# Patient Record
Sex: Female | Born: 1950 | Race: Black or African American | Hispanic: No | Marital: Married | Smoking: Never smoker
Health system: Southern US, Community
[De-identification: ages and names within clinical notes are randomized; demographics above are authoritative.]

## PROBLEM LIST (undated history)

## (undated) DIAGNOSIS — E079 Disorder of thyroid, unspecified: Secondary | ICD-10-CM

## (undated) DIAGNOSIS — IMO0002 Reserved for concepts with insufficient information to code with codable children: Secondary | ICD-10-CM

## (undated) DIAGNOSIS — F4001 Agoraphobia with panic disorder: Secondary | ICD-10-CM

## (undated) DIAGNOSIS — F339 Major depressive disorder, recurrent, unspecified: Secondary | ICD-10-CM

## (undated) DIAGNOSIS — E119 Type 2 diabetes mellitus without complications: Principal | ICD-10-CM

## (undated) DIAGNOSIS — I10 Essential (primary) hypertension: Secondary | ICD-10-CM

## (undated) DIAGNOSIS — Z124 Encounter for screening for malignant neoplasm of cervix: Secondary | ICD-10-CM

## (undated) DIAGNOSIS — Z7189 Other specified counseling: Secondary | ICD-10-CM

## (undated) DIAGNOSIS — Z01818 Encounter for other preprocedural examination: Secondary | ICD-10-CM

## (undated) DIAGNOSIS — E1142 Type 2 diabetes mellitus with diabetic polyneuropathy: Secondary | ICD-10-CM

## (undated) DIAGNOSIS — L570 Actinic keratosis: Secondary | ICD-10-CM

## (undated) DIAGNOSIS — Z1231 Encounter for screening mammogram for malignant neoplasm of breast: Secondary | ICD-10-CM

## (undated) DIAGNOSIS — R053 Chronic cough: Secondary | ICD-10-CM

## (undated) DIAGNOSIS — R600 Localized edema: Secondary | ICD-10-CM

## (undated) DIAGNOSIS — K625 Hemorrhage of anus and rectum: Secondary | ICD-10-CM

## (undated) DIAGNOSIS — E785 Hyperlipidemia, unspecified: Secondary | ICD-10-CM

## (undated) DIAGNOSIS — E790 Hyperuricemia without signs of inflammatory arthritis and tophaceous disease: Secondary | ICD-10-CM

## (undated) DIAGNOSIS — M79676 Pain in unspecified toe(s): Secondary | ICD-10-CM

## (undated) DIAGNOSIS — Z903 Acquired absence of stomach [part of]: Secondary | ICD-10-CM

## (undated) DIAGNOSIS — L680 Hirsutism: Secondary | ICD-10-CM

## (undated) DIAGNOSIS — J209 Acute bronchitis, unspecified: Secondary | ICD-10-CM

## (undated) DIAGNOSIS — I8290 Acute embolism and thrombosis of unspecified vein: Secondary | ICD-10-CM

## (undated) DIAGNOSIS — K219 Gastro-esophageal reflux disease without esophagitis: Secondary | ICD-10-CM

## (undated) DIAGNOSIS — G4733 Obstructive sleep apnea (adult) (pediatric): Secondary | ICD-10-CM

## (undated) DIAGNOSIS — M79673 Pain in unspecified foot: Secondary | ICD-10-CM

## (undated) DIAGNOSIS — D485 Neoplasm of uncertain behavior of skin: Secondary | ICD-10-CM

## (undated) DIAGNOSIS — D126 Benign neoplasm of colon, unspecified: Secondary | ICD-10-CM

## (undated) DIAGNOSIS — L858 Other specified epidermal thickening: Secondary | ICD-10-CM

## (undated) DIAGNOSIS — Z1211 Encounter for screening for malignant neoplasm of colon: Secondary | ICD-10-CM

## (undated) DIAGNOSIS — M109 Gout, unspecified: Secondary | ICD-10-CM

## (undated) DIAGNOSIS — K6289 Other specified diseases of anus and rectum: Secondary | ICD-10-CM

## (undated) HISTORY — DX: Disorder of thyroid, unspecified: E07.9

## (undated) HISTORY — PX: TONSILLECTOMY: SUR1361

## (undated) HISTORY — DX: Hyperlipidemia, unspecified: E78.5

---
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
## 1974-11-13 HISTORY — PX: LAPAROSCOPIC OVARIAN CYSTECTOMY: SUR786

## 1985-11-13 HISTORY — PX: VAGINAL HYSTERECTOMY: SUR661

## 1994-02-24 LAB — PAP SMEAR: Pathology Report: NORMAL

## 1994-12-01 LAB — PAP SMEAR
Comment 11: ABNORMAL
Last menstrual period reported: 11196

## 1995-02-01 LAB — PAP SMEAR
Comment 11: ABNORMAL
Last menstrual period reported: 30596

## 1995-11-08 LAB — PAP SMEAR
Last menstrual period reported: 121096
Pathology Report: NORMAL

## 1996-11-27 LAB — PAP SMEAR
Last menstrual period reported: 10798
Pathology Report: NORMAL

## 1997-11-13 HISTORY — PX: SIGMOIDOSCOPY: SUR1295

## 1998-02-08 LAB — PAP SMEAR
Last menstrual period reported: 315
Pathology Report: NORMAL

## 1999-03-16 ENCOUNTER — Encounter: Payer: Self-pay | Admitting: Family Medicine

## 1999-03-16 LAB — CONVERTED CEMR LAB
RBC count: 3.84 10*6/uL
WBC, blood: 5.6 10*3/uL

## 1999-03-22 LAB — PAP SMEAR
Last menstrual period reported: 42200
Pathology Report: NORMAL

## 2000-02-06 LAB — PAP SMEAR
Pathology Report: NORMAL
REPORT DATE: 41101

## 2000-08-13 ENCOUNTER — Emergency Department (HOSPITAL_COMMUNITY): Admission: EM | Admit: 2000-08-13 | Discharge: 2000-08-13 | Payer: Self-pay | Admitting: Emergency Medicine

## 2000-08-13 ENCOUNTER — Encounter: Payer: Self-pay | Admitting: Family Medicine

## 2001-04-14 ENCOUNTER — Emergency Department (HOSPITAL_COMMUNITY): Admission: EM | Admit: 2001-04-14 | Discharge: 2001-04-14 | Payer: Self-pay | Admitting: Emergency Medicine

## 2001-05-30 LAB — PAP SMEAR

## 2001-06-26 ENCOUNTER — Encounter: Payer: Self-pay | Admitting: Family Medicine

## 2001-06-26 LAB — CONVERTED CEMR LAB: RBC count: 3.85 10*6/uL

## 2001-10-17 ENCOUNTER — Encounter

## 2002-02-11 DIAGNOSIS — E785 Hyperlipidemia, unspecified: Secondary | ICD-10-CM

## 2002-02-11 HISTORY — DX: Hyperlipidemia, unspecified: E78.5

## 2002-02-11 MED ORDER — ATENOLOL 100 MG PO TABS
100 MG | ORAL | Status: AC
Start: 2002-02-11 — End: 2003-03-02

## 2002-02-17 ENCOUNTER — Encounter: Payer: Self-pay | Admitting: Family Medicine

## 2002-02-17 LAB — CONVERTED CEMR LAB
Blood Glucose, Fasting: 81 mg/dL
RBC count: 3.85 10*6/uL
WBC, blood: 6.8 10*3/uL

## 2002-03-05 MED ORDER — METFORMIN HCL 500 MG PO TABS
500 MG | ORAL | Status: AC
Start: 2002-03-05 — End: 2003-01-11

## 2002-04-13 DIAGNOSIS — E079 Disorder of thyroid, unspecified: Secondary | ICD-10-CM

## 2002-04-13 HISTORY — DX: Disorder of thyroid, unspecified: E07.9

## 2002-05-08 ENCOUNTER — Encounter: Payer: Self-pay | Admitting: Family Medicine

## 2002-05-08 LAB — CONVERTED CEMR LAB: Pap Smear: NORMAL

## 2002-05-12 ENCOUNTER — Emergency Department (HOSPITAL_COMMUNITY): Admission: EM | Admit: 2002-05-12 | Discharge: 2002-05-12 | Payer: Self-pay

## 2002-05-12 ENCOUNTER — Encounter: Payer: Self-pay | Admitting: Internal Medicine

## 2002-07-15 MED ORDER — BLOOD GLUCOSE TEST VI STRP
Status: AC
Start: 2002-07-15 — End: 2003-07-15

## 2002-10-16 LAB — PAP SMEAR

## 2002-11-28 MED ORDER — ATENOLOL 100 MG PO TABS
100 MG | ORAL | Status: AC
Start: 2002-11-28 — End: 2004-01-14

## 2003-01-06 MED ORDER — METFORMIN HCL 500 MG PO TABS
500 MG | ORAL | Status: AC
Start: 2003-01-06 — End: 2003-09-13

## 2003-06-26 ENCOUNTER — Encounter: Payer: Self-pay | Admitting: Family Medicine

## 2003-06-26 LAB — CONVERTED CEMR LAB: TSH: 2.661 microintl units/mL

## 2003-07-17 MED ORDER — METFORMIN HCL 850 MG PO TABS
850 MG | ORAL | Status: AC
Start: 2003-07-17 — End: 2004-04-30

## 2003-12-30 ENCOUNTER — Encounter: Payer: Self-pay | Admitting: Family Medicine

## 2003-12-30 LAB — CONVERTED CEMR LAB: TSH: 0.384 microintl units/mL

## 2004-04-13 HISTORY — PX: COLONOSCOPY: SHX174

## 2004-08-08 ENCOUNTER — Encounter: Payer: Self-pay | Admitting: Family Medicine

## 2004-08-17 LAB — PAP SMEAR

## 2004-10-17 ENCOUNTER — Encounter

## 2004-10-22 LAB — CARDIOLIPIN ANTIBODY, IGA: Cardiolipin IgA: >14 [APL'U]

## 2005-01-04 ENCOUNTER — Ambulatory Visit: Payer: Self-pay | Admitting: Family Medicine

## 2005-04-09 ENCOUNTER — Emergency Department (HOSPITAL_COMMUNITY): Admission: EM | Admit: 2005-04-09 | Discharge: 2005-04-09 | Payer: Self-pay | Admitting: Emergency Medicine

## 2005-11-25 ENCOUNTER — Emergency Department (HOSPITAL_COMMUNITY): Admission: EM | Admit: 2005-11-25 | Discharge: 2005-11-26 | Payer: Self-pay | Admitting: Emergency Medicine

## 2005-12-20 ENCOUNTER — Ambulatory Visit: Payer: Self-pay | Admitting: Family Medicine

## 2007-03-18 ENCOUNTER — Encounter

## 2007-03-21 LAB — HPV DNA PROBE, AMPLIFIED: HPV 16+18+31+33+35+39+45+51+52+56+58+59+68 DNA,CERVIX,PROBE: NEGATIVE

## 2007-04-04 LAB — PAP SMEAR

## 2007-08-06 ENCOUNTER — Encounter

## 2007-08-19 ENCOUNTER — Ambulatory Visit: Payer: Self-pay | Admitting: Family Medicine

## 2007-08-19 DIAGNOSIS — N959 Unspecified menopausal and perimenopausal disorder: Secondary | ICD-10-CM | POA: Insufficient documentation

## 2007-08-27 ENCOUNTER — Encounter: Payer: Self-pay | Admitting: Family Medicine

## 2007-11-27 ENCOUNTER — Encounter: Payer: Self-pay | Admitting: Family Medicine

## 2007-11-27 DIAGNOSIS — E785 Hyperlipidemia, unspecified: Secondary | ICD-10-CM

## 2007-11-27 DIAGNOSIS — E039 Hypothyroidism, unspecified: Secondary | ICD-10-CM | POA: Insufficient documentation

## 2007-11-27 DIAGNOSIS — Z8679 Personal history of other diseases of the circulatory system: Secondary | ICD-10-CM | POA: Insufficient documentation

## 2007-11-27 DIAGNOSIS — Z87898 Personal history of other specified conditions: Secondary | ICD-10-CM | POA: Insufficient documentation

## 2007-12-06 ENCOUNTER — Ambulatory Visit: Payer: Self-pay | Admitting: Family Medicine

## 2007-12-06 LAB — CONVERTED CEMR LAB
AST: 31 units/L (ref 0–37)
Albumin: 3.8 g/dL (ref 3.5–5.2)
Alkaline Phosphatase: 58 units/L (ref 39–117)
BUN: 10 mg/dL (ref 6–23)
Chloride: 102 meq/L (ref 96–112)
Creatinine, Ser: 0.8 mg/dL (ref 0.4–1.2)
Direct LDL: 132.4 mg/dL
Total Bilirubin: 1.1 mg/dL (ref 0.3–1.2)
VLDL: 16 mg/dL (ref 0–40)

## 2007-12-10 ENCOUNTER — Ambulatory Visit: Payer: Self-pay | Admitting: Family Medicine

## 2008-03-13 ENCOUNTER — Encounter: Payer: Self-pay | Admitting: Family Medicine

## 2008-03-17 ENCOUNTER — Encounter (INDEPENDENT_AMBULATORY_CARE_PROVIDER_SITE_OTHER): Payer: Self-pay | Admitting: *Deleted

## 2008-07-01 ENCOUNTER — Ambulatory Visit: Payer: Self-pay | Admitting: Family Medicine

## 2008-07-01 LAB — CONVERTED CEMR LAB
Bilirubin Urine: NEGATIVE
Nitrite: NEGATIVE
Urobilinogen, UA: 0.2
pH: 6.5

## 2008-07-02 ENCOUNTER — Encounter: Payer: Self-pay | Admitting: Family Medicine

## 2009-02-08 ENCOUNTER — Ambulatory Visit: Payer: Self-pay | Admitting: Family Medicine

## 2009-02-26 ENCOUNTER — Encounter (INDEPENDENT_AMBULATORY_CARE_PROVIDER_SITE_OTHER): Payer: Self-pay | Admitting: *Deleted

## 2009-09-28 ENCOUNTER — Ambulatory Visit: Payer: Self-pay | Admitting: Family Medicine

## 2009-09-28 LAB — CONVERTED CEMR LAB
Glucose, Urine, Semiquant: NEGATIVE
Specific Gravity, Urine: <1.005
Urobilinogen, UA: 0.2

## 2009-09-29 ENCOUNTER — Encounter: Payer: Self-pay | Admitting: Family Medicine

## 2009-11-25 ENCOUNTER — Telehealth: Payer: Self-pay | Admitting: Gastroenterology

## 2010-02-07 ENCOUNTER — Ambulatory Visit: Payer: Self-pay | Admitting: Family Medicine

## 2010-02-14 ENCOUNTER — Encounter

## 2010-02-15 LAB — HPV DNA PROBE, AMPLIFIED: HPV 16+18+31+33+35+39+45+51+52+56+58+59+68 DNA,CERVIX,PROBE: NEGATIVE

## 2010-02-21 LAB — PAP SMEAR

## 2010-04-05 ENCOUNTER — Encounter: Payer: Self-pay | Admitting: Family Medicine

## 2010-04-26 ENCOUNTER — Ambulatory Visit: Payer: Self-pay | Admitting: Family Medicine

## 2010-04-26 LAB — CONVERTED CEMR LAB
Bilirubin Urine: NEGATIVE
Specific Gravity, Urine: 1.02
Urobilinogen, UA: 0.2
pH: 8

## 2010-04-28 ENCOUNTER — Encounter: Payer: Self-pay | Admitting: Family Medicine

## 2010-05-04 ENCOUNTER — Telehealth: Payer: Self-pay | Admitting: Family Medicine

## 2010-06-21 ENCOUNTER — Encounter (INDEPENDENT_AMBULATORY_CARE_PROVIDER_SITE_OTHER): Payer: Self-pay | Admitting: *Deleted

## 2010-08-16 ENCOUNTER — Encounter

## 2010-08-17 ENCOUNTER — Telehealth

## 2010-08-17 NOTE — Telephone Encounter (Addendum)
Member's chart has been reviewed in preparation for his upcoming visit with Dr. Pricilla Holm on 08/31/10. The following Care Gaps were identified: POE LAB ORDERS :HEMOGLOBIN A1C

## 2010-08-17 NOTE — Telephone Encounter (Addendum)
Mbr had diabetic panel done.

## 2010-08-22 ENCOUNTER — Telehealth

## 2010-08-22 MED ORDER — CHOLECALCIFEROL 50 MCG (2000 UT) PO TABS
50 MCG (2000 UT) | ORAL | Status: AC
Start: 2010-08-22 — End: 2014-09-16

## 2010-08-22 NOTE — Telephone Encounter (Addendum)
Agreed will begin Vitamin D3 2000-5000 units po qd; Educated about medications including options, risks, and benefits. Call prn and follow up as scheduled.

## 2010-08-24 NOTE — Progress Notes (Addendum)
Antidepressant follow up: No call. Client spoke with L. DeBalzo CNS 08/22/10.

## 2010-08-31 NOTE — Progress Notes (Addendum)
SUBJECTIVE:  This 59 year old  female presents for follow up of diabetes.    Other significant comorbidities include hyperlipidemia, hypertension, obesity and depression.    Patient's cardiovascular risk history includes: LDL goal is under 100  hypertension  hyperlipidemia  obese.    Her compliance with diet and other recommendations since last visit has been good.  Much improved     Self monitoring of Glucose:  Pre-breakfast 169-189, at times 120 if eating vegetable salad        Pre-lunch denies snacks             Pre-dinner at times 120       Bedtime takes Metformin 1000 mg at bedtime and in morning, Glyburide 5 mg in AM/15 mg in PM  Overnight     Severe hypoglycemic episodes since last visit: no    Mild hypoglycemic episode: none per week.    Current outpatient prescriptions   Medication Sig Dispense Refill   ??? VITAMIN D-3 2000 UNIT ORAL TAB Take 1-1-1/2 tabs po qd  45  3   ??? SERTRALINE 100 MG ORAL TAB TAKE 1 TABLET BY MOUTH ONETIME DAILY  60  1   ??? ATENOLOL 100 MG ORAL TAB TAKE 1 TABLET BY MOUTH ONE TIME DAILY  100  3   ??? METFORMIN 1000 MG ORAL TAB TAKE 1 TABLET BY MOUTH TWICE DAILY FOR DIABETES  120  3   ??? SERTRALINE  50 MG ORAL TAB TAKE 1 TABLET BY MOUTH ONE TIME DAILY FOR 1 WEEK THEN TAKE 2 TABLETS BY MOUTH ONE TIME DAILY  60  3   ??? TRAZODONE  50 MG ORAL TAB TAKE 1/2 TO 2 TABLETS BY MOUTH EVERY NIGHT AT BEDTIME AS NEEDED FOR SLEEP  60  3   ??? SIMVASTATIN 40 MG ORAL TAB TAKE 1 TABLET BY MOUTH EVERY NIGHT AT BEDTIME  60  5   ??? PROAIR HFA 90 MCG/ACTUATION INHL HFAA USE 2 PUFFS BY MOUTH EVERY 4 TO 6 HOURS AS NEEDED FOR WHEEZING AND COUGH  8.5  2   ??? ONE TOUCH ULTRA TEST MISC STRIPS USE AS DIRECTED BY PHYSICIAN FOR DIABETES  200  12   ??? GLYBURIDE 5 MG ORAL TAB TAKE 1 TABLET BY MOUTH EVERY MORNING AND TAKE 3 TABLETS EVERY EVENING FOR DIABETES  360  3   ??? ACTOS 15 MG ORAL TAB TAKE 2 TABLETS BY MOUTH ONE TIME DAILY FOR DIABETES  120  3   ??? LISINOPRIL 10 MG ORAL TAB TAKE 1 TABLET BY MOUTH ONE TIME DAILY FOR BLOOD  PRESSURE  90  3   ??? ALLOPURINOL 100 MG ORAL TAB TAKE 2 TABLETS BYMOUTH ONE TIME DAILY TO PREVENT GOUT  200  3     History   Smoking status   ??? Former Smoker   ??? Quit date: 11/13/1993   Smokeless tobacco   ??? Never Used           HGBA1C   Date Value Range Status   08/16/2010 7.1* 4.2-5.8 (%) Final       CHOLESTEROL   Date Value Range Status   08/16/2010 119  <200- (MG/DL) Final   9/60/4540 981  <200- (MG/DL) Final   19/11/4780 956  <200- (MG/DL) Final      TRIGLYCERIDE   Date Value Range Status   08/16/2010 184* <150- (MG/DL) Final   12/27/863 784* <150- (MG/DL) Final   69/04/2951 841* <150- (MG/DL) Final      HDL  Date Value Range Status   08/16/2010 31* >40- (MG/DL) Final    (NOTE)    >=16 mg/dl is Desirable   11/22/6043 38* >40- (MG/DL) Final    (NOTE)    >=40 mg/dl is Desirable   98/11/1912 44  >40- (MG/DL) Final    (NOTE)    >=78 mg/dl is Desirable      LDL DIRECT   Date Value Range Status   08/16/2010 56  30-130 (MG/DL) Final    (NOTE)    LESS THAN 100 MG/DL      OPTIMAL    295 TO 129 MG/DL         NEAR OPTIMAL/ABOVE OPTIMAL    130 TO 159 MG/DL         BORDERLINE HIGH    160 TO 189 MG/DL    HIGH    621 MG/DL AND ABOVE      VERY HIGH   01/31/2010 82  30-130 (MG/DL) Final    (NOTE)    LESS THAN 100 MG/DL      OPTIMAL    308 TO 129 MG/DL         NEAR OPTIMAL/ABOVE OPTIMAL    130 TO 159 MG/DL         BORDERLINE HIGH    160 TO 189 MG/DL         HIGH    657 MG/DL AND ABOVE      VERY HIGH   09/20/2009 89  30-130 (MG/DL) Final    (NOTE)    LESS THAN 100 MG/DLOPTIMAL    100 TO 129 MG/DL         NEAR OPTIMAL/ABOVE OPTIMAL    130 TO 159 MG/DL         BORDERLINE HIGH    160 TO 189 MG/DL         HIGH    846 MG/DL AND ABOVE      VERY HIGH      LDL CALCULATED   Date Value Range Status   12/21/2004 93  50-140 (MG/DL) Final   9/62/9528 84  50-140 (MG/DL) Final       MICROALBUMIN, URINE   Date Value Range Status   08/16/2010 1.0  0.0-1.7 (MG/DL) Final      CREATININE, URINE   Date Value Range Status   08/16/2010 181.2  - (MG/DL) Final       MICROALBUMIN/CREATININE RATIO, URINE   Date Value Range Status   08/16/2010 5.5  <30- (MG/G) Final       POTASSIUM   Date Value Range Status   08/16/2010 4.3  3.5-5.0 (mmol/L) Final       ALT   Date Value Range Status   08/16/2010 18  9-52 (U/L) Final       CREATININE   Date Value Range Status   08/16/2010 0.60  0.52-1.04 (MG/DL) Final         BP Readings from Last 3 Encounters:   08/31/2010 120/72   08/03/2010 122/67   02/25/2010 126/64           Estimated Body mass index is 42.76 kg/(m^2) as calculated from the following:    Height as of this encounter: 5\' 7" (1.702 m).    Weight as of this encounter: 273 lb(123.832 kg).  "I like potatoes",   No exercising    OBJECTIVE: 20 lb weight reduction past 6 months.   FOOT EXAM: sensate to 10 gms, skin intact, pulses present and pulses present      ASSESSMENT/PLAN:  DM 2  (primary encounter diagnosis)  Note: uncontrolled but secondary to dietary preferences  Plan: pt. Will involve in exercise program. Continue current therapy.   PROPHYLACTIC VACCINE FOR INFLUENZA  Note: immunization  Plan: VACC INFLUENZA SPLIT VIRUS, 18 YRS AND OLDER,         VACC ADMIN, FIRST IM OR SUBQ VACCINE TOXOID              FOLLOW-UP: 2 mo with NP

## 2010-09-19 ENCOUNTER — Ambulatory Visit: Payer: Self-pay | Admitting: Family Medicine

## 2010-09-19 DIAGNOSIS — N390 Urinary tract infection, site not specified: Secondary | ICD-10-CM

## 2010-09-19 LAB — CONVERTED CEMR LAB
Ketones, urine, test strip: NEGATIVE
Nitrite: NEGATIVE
Specific Gravity, Urine: <1.005
Urobilinogen, UA: 0.2

## 2010-09-19 NOTE — Progress Notes (Addendum)
Antidepressant follow up: I have attempted to contact this patient by phone with the following results: left message to return my call on answering machine. Compliant with medication per pharmacy screen.       Medication: Sertraline 100mg  one daily    Last refiled:09/02/10    Dispensed: #60    Refills remaining:1

## 2010-09-20 ENCOUNTER — Encounter: Payer: Self-pay | Admitting: Family Medicine

## 2010-09-29 NOTE — Telephone Encounter (Addendum)
Consult  Giavana Bains 59 year old female  Chief Complaint:  Diabetic with left toe red, bruising to lateral aspect and 5th digit and plantar surface of foot, painful and has neuropathy    Duration: onset 11/16, can't recall injury    With Provider: Dr. Antonietta Barcelona seen in past    Preferred Days:  Friday    Preferred Time:  no preference    [facility]: [facility]    Preferred Appointment: Based on template review appointment is being forwarded to the department for scheduling assistance    Contact Phone Number:   Telephone Information:   Home Phone [phone]     Deborra Cossey a 59 year old with chiefcomplaint of onset yesterday of left 4th toe redness yesterday and has bruising on side of foot and including last toe bruised.  Diabetic, painful to foot in shoe and shoe feels tight.  Has neuropathy.  States did cut nails on foot today.  Advised to sda.      Triaged and Assessed: Foot Problems-P Protocol    N Skin breakage    N Drainage/discharge    N Fever, night sweats, chills    N Severe pain    ? Redand  warm    Y Discoloration     Schedule SDA (appt within 2-24h) in medicine or Podiatry or follow UC process.       Triage Plan Demographics verified, Instructed to call back if symptoms persist/change/worsen and Verbalized understanding/willingness to follow instructions.

## 2010-09-30 ENCOUNTER — Encounter

## 2010-09-30 NOTE — Progress Notes (Addendum)
SUBJECTIVE:  Eileen Barnes is a 59 year old female. is identified by name. Presents with redness and bruising of the 4th and 5th toe left x 2 days.  BS elevated  Onset: Insidious    Nature: brusing / rednesss  Injury: None    Aggravating Factors: walking  Prior Treatment: none    ALLERGY:  Allergies   Allergen Reactions   ??? Tramadol Hcl Nausea and/or Vomiting   ??? Codeine And Opiate Derivatives Nausea and/or Vomiting   ??? Kiwi Fruit      Pt. States throat closes up    ??? Vicodin (Acetaminophen-hydrocodone)      Vomiting     ??? Pamelor (Nortriptyline)      Severe constipation   ??? Talwin (Pentazocine Lactate) Nausea and/or Vomiting         Current outpatient prescriptions:ASPIRIN   81 MG ORAL TBEC DR TAB, 1 TAB PO DAILY, Disp: 100, Rfl: 0;  VITAMIN D-3 2000 UNIT ORAL TAB, Take 1-1-1/2 tabs po qd, Disp: 45, Rfl: 3;  SERTRALINE 100 MG ORAL TAB, TAKE 1 TABLET BY MOUTH ONE TIME DAILY, Disp: 60, Rfl: 1;  ATENOLOL 100 MG ORAL TAB, TAKE 1 TABLET BY MOUTH ONE TIME DAILY, Disp: 100, Rfl: 3;  METFORMIN 1000 MG ORAL TAB, TAKE 1 TABLET BY MOUTH TWICE DAILY FOR DIABETES, Disp: 120, Rfl: 3  SERTRALINE  50 MG ORAL TAB, TAKE 1 TABLET BY MOUTH ONE TIME DAILY FOR 1 WEEK THEN TAKE 2 TABLETS BY MOUTH ONE TIME DAILY, Disp: 60, Rfl: 3;  TRAZODONE  50 MG ORAL TAB, TAKE 1/2 TO 2 TABLETS BY MOUTH EVERY NIGHT AT BEDTIME AS NEEDED FOR SLEEP, Disp: 60, Rfl: 3;  SIMVASTATIN 40 MG ORAL TAB, TAKE 1 TABLET BY MOUTH EVERY NIGHT AT BEDTIME, Disp: 60, Rfl: 5  PROAIR HFA 90 MCG/ACTUATION INHL HFAA, USE 2 PUFFS BY MOUTH EVERY 4 TO 6 HOURS AS NEEDED FOR WHEEZING AND COUGH, Disp: 8.5, Rfl: 2;  ONE TOUCH ULTRA TEST MISC STRIPS, USE AS DIRECTED BY PHYSICIAN FOR DIABETES, Disp: 200, Rfl: 12;  GLYBURIDE 5 MG ORAL TAB, TAKE 1 TABLET BY MOUTH EVERY MORNING AND TAKE 3 TABLETS EVERY EVENING FOR DIABETES, Disp: 360, Rfl: 3  LISINOPRIL 10 MG ORAL TAB, TAKE 1 TABLET BY MOUTH ONE TIME DAILY FOR BLOOD PRESSURE, Disp: 90, Rfl: 3;  ALLOPURINOL 100 MG ORAL TAB, TAKE 2  TABLETS BY MOUTH ONE TIME DAILY TO PREVENT GOUT, Disp: 200, Rfl: 3;  ACTOS 15 MG ORAL TAB, TAKE 2 TABLETS BY MOUTH ONE TIME DAILY FOR DIABETES, Disp: 120, Rfl: 3    Past Medical History   Diagnosis Date   ??? SEVERE OBESITY (BMI >= 40) 07/09/2004     MORBID OBESITY   ??? MENOPAUSE 08/16/2004     FEMALE CLIMACTERIC STATE   ??? ACTINIC KERATOSIS.Marland Kitchen 02/12/2003     ACTINIC KERATOSIS   ??? DM 2, UNCONTROLLED, W DIABETIC POLYNEUROPATHY    ??? ESSENTIAL HTN    ??? HYPERLIPIDEMIA    ??? GOUT          Past Surgical History   Procedure Date   ??? Lasik          History   Social History   ??? Marital Status: Unknown     Spouse Name: Married     Number of Children: N/A   ??? Years of Education: N/A   Occupational History   ??? Retired Public house manager     Social History Main Topics   ??? Smoking status: Former Smoker  Quit date: 11/13/1993   ??? Smokeless tobacco: Never Used   ??? Alcohol Use: No   ??? Drug Use: No   ??? Sexually Active: Not Currently -- Female partner(s)      ascus (=) HPV with cryo   Other Topics Concern   ??? Not onfile   Social History Narrative   ??? No narrative on file       Family History   Problem Relation Age of Onset   ??? Other [Other] [OTHER] Mother      10 Years of Alzheimer's Disease and Living  with HTN, Thyroid problems   ??? Diabetes Father      Deceased With Complications, Dialysis patient   ??? Hypothyroidism Paternal Aunt      Hx of Grave's Disease   ??? Other [Other] [OTHER] Paternal Aunt      Neuropathy unrelated to Diabetes   ??? Other [Other] [OTHER] Brother      Overweight with HX of DVT   ??? Breast Cancer Maternal Aunt    ??? Colon Cancer Maternal Aunt      x4   ??? Ovarian Cancer None    ??? Uterine Cancer None    ??? Diabetes Maternal Grandmother          ROS: DM / neuropathy / toe pain  Pt. Denies fever, chills, nausea, vomiting, diarrhea, calf pain. chest pain, shortness of breath.    OBJECTIVE:  General appearance:alert and oriented. No acute stress    EXAMINATION:     LEFT    FOOT / ANKLE     VASCULAR:   Pulses are palpable   Capillary refill is  normal.  Hair growth noted on digits.  Skin temp is warm to warm.  No varicosities noted.    NEURO:  Epicritic sensation decreased with decreased sharp / dull sensation    DERM:  Skin turgor WNL .   No ulcers, no signs of infection noted.  No cellulitis present.  NO Calluses noted   Nails are wnl  Web spaces are clear.    LYMPH:  No lymphangitis present.    ORTHO:  Foot type is rectus.  Muscle strength is +5/5.  Normal foot and ankle ROM  Discomfort to palpation of 4th and 5th  toes with ecchymosis 5th toe and erythema DIPJ 4th toe left    SHOEGEAR: Diabetic shoes      X-RAYS:  No fracture noted 4th or 5th toes left. Spurring noted bunion left    ASSESSMENT:  Strain foot / toes.  DM with neuropathy        TREATMENT:  1. Exam.  2. Discuss Dx and Tx options.  3. Xrays  4. Patient defers cast / surgical shoe.  5. Instructions    RTC if no improvement

## 2010-09-30 NOTE — Telephone Encounter (Addendum)
Contacted pt, advised we have no regular openings, asked if she wanted to come in on Monday, try to go to another facility, or informed we could try to OB. Pt sts "forget it, I'll go to the ER, I'm not waiting". And ended call. Danella Maiers [place]

## 2010-10-10 ENCOUNTER — Encounter: Payer: Self-pay | Admitting: Family Medicine

## 2010-10-25 ENCOUNTER — Encounter

## 2010-10-25 NOTE — Progress Notes (Addendum)
Eileen Barnes presented to the lab & orders placed upon their call that she was there  - C. Juelz Claar CRNP

## 2010-11-02 NOTE — Patient Instructions (Addendum)
ASSESSMENT:   1. TYPE 2 DIABETES: UNCONTROLLED   2. HTN - STABLE   2. HYPERLIPIDEMIA - STABLE   2. HX of PERIPHERAL EDEMA   2. MEDICATION REFILLS   2. OBESITY     PLAN: Glucometer Check Now = 360 Mgs   1. Most recent lab work reviewed & goals discussed for best Blood Pressure, Diabetic Control and Lipid or Cholesterol Control.   (Hemoglobin A1C Value of LESS THAN 7% for CONTROLLED DIABETIC STATE; LESS THAN 6.5% for BEST CONTROLLED STATE).     2. New Lancet Device -Ordered as per her request   Glucose Meter use reviewed with the following advice:   - Desirable Blood Sugar Goals of 80 -130 or 140 Mgs pre-meals & Less Than 150 or 160 Mgs before bedtime.   - Try to perform regular blood sugar checks before AM &PM meals Daily.   - DO NOT BE LAX in Self Assessing own control of Blood Sugars and Diabetic state. This is important for your health.   - Monitor for any Hypoglycemia or Low Blood Sugar Levels (Glucose level under 80 Mgs)   - Call to speak with your Doctor or myself for any high readings or if persistently elevated over 200 Mgs for the majority of readings.     3. Exercise DAILY by walking, swimming, bike riding or use of a Treadmill or a portable Pedal machine placed on the floor for 20 - 40 minutes to benefit Weight, Blood Pressure, Cardiovascular Health and Diabetic Glycemic control.     4. Advised to be aware that Diabetes can be partly Controlled with Dietary management, Weight loss & Exercise. Dietary Considerations Include the Following :   -Remember that higher Carbohydrate foods will affect your blood sugars.   -Limit Higher Carbohydrate foods & Concentrated Sweets to smaller portion sizes   -Eat 3 Regular meals with small snacks. DO NOT SKIP A MEAL.   -You may eat a small snack before bedtime of 10-20 Grams of Carbohydrate or Less than a 30 Gram Carbohydrate Snack   -Include more fiber in the diet such as cooked or sliced raw vegetables and salads.   -Eat all fruits with a meal rather than as a snack at  night   -Limit ALL Fast Foods and processed foods as much as possible due to high fat content, high sodium & high calories.   -Drink plenty of water daily - 6 to 8 full glasses.   -Limit Caffeine and Alcoholic beverages.     5. Medications discussed, benefits, compliance with taking medications as prescribed reviewed. Continue current medications and doses. CHANGES ARE AS FOLLOWS:   - Start New Novolog Insulin per sliding scale at Breakfast & Dinner   Glucose Value of :   160 - 200 - Try 2 units   201 - 225 - 3 Units   226 - 250 - 4 Units   251 - 275 - 5 units   276 - 300 - 6 units   301 - 325 - 7 units   326 - 350 - 8 units   351 & higher - 10 Units   - Atenolol & Lasix reordered for Mail Order Pharmacy to refill     6. Submit stool for hidden blood & Repeat the Fasting Lab work in Late March or early April - Tests ordered     7. Follow up as necessary or in 4-6 months. Bring your Glucose Meter to every Appointment for review.     8.  Patient to call as necessary for blood sugar or meter problems or other health concerns.     INTERNAL MEDICINE LINE: [phone]   May sign up or Access On Line information at [email or web address]

## 2010-11-02 NOTE — Progress Notes (Addendum)
SUBJECTIVE:  Eileen Barnes, a 59 year old female,  who is known to me & verified by name, presentstoday for Diabetes Management and Follow Up.     Diabetic Control as per on chart review as follows:  Diabetes 09/04/2008 12/04/2008 05/19/2009 09/20/2009 01/31/2010   HBA1c 6.5 (H) 6.6 (H) 7.2 (H) 7.4 (H) 7.4 (H)     Diabetes 08/16/2010 10/25/2010   HBA1c 7.1 (H) 8.1 (H)       DURATION OF DIABETES: "Over 10 years now"    FAMILY HX: Grandmother, Father known to have Diabetes       Diabetic Review of Systems -   Medication compliance: Patient is compliant in taking Diabetic & other prescribed  Medications.    Diabetic Diet compliance: Patient is trying to be compliant in eating 3 regular meals, but not always making healthy food choices. She admits she used Sugar on her cereal today and coffee creamer which may have hadmoderate glucose or carb content.       Home Glucose Monitoring: Patient does perform AM Glucose Checks & Occasionally checks at other times of the day. She has noticed most post Prandial Glucose values are staying high 2-4 Hours post meal. Average Values on Personal Glucose Meter as Checked Today:    7 Day Average = 184  14 Day Average = 192  30 Day Average = 195  LAST VALUES TAKEN OFF METER - 163, 156, 201, 201, 175, 207, 193, 182, 177, 183, 237, 175, 219, 230, 178, 219, 188, 215, 181, 277, 128, 174, 217, 197, 177, 198, 181, 214, 179, 164, 191, 190, 165, 174, 212, 182, 215, 166, 187, 224, 222, 184, 211, 190, 215, 181, 175, 215, 179, 224, 212, 245, 206, 183, 200, 161, 184, 190, 159, 160, 228, 179, 195, 181, 174, 170, 167, 168      EXERCISE: NONE LATELY . Family members have moved in with her & her husband. She is babysitting more than ever.      LAST DRE: 04/2010       OTHER ISSUES:  HTN Hx - Chart reviewed & BP appears controlled as follows:  Vitals 09/22/2009 01/12/2010 02/07/2010 02/14/2010 02/25/2010   SYSTOLIC 130 130 604 120 126   DIASTOLIC 80 69 70 82 64     Vitals 08/03/2010 08/31/2010 09/30/2010 11/02/2010    SYSTOLIC 122 120 540 110   DIASTOLIC 67 72 80 68         Hyperlipidemia - Lipid values reviewed & are at Goal    Lipids with LFT's 11/22/2007 09/04/2008 12/04/2008 05/19/2009 09/20/2009   cholesterol 155 133 139 151 160   LDL direct 86 67 78 82 89   HDL 40 (L) 32 (L) 40 (L) 34 (L) 44   Trig 198 (H) 184 (H) 157 (H) 181 (H) 167 (H)     Lipids with LFT's 01/31/2010 08/16/2010   cholesterol 141 119   LDL direct 82 56   HDL 38 (L) 31 (L)   Trig 183 (H) 184 (H)       She requests medication refills for  Lasix & Atenolol         Past Medical History   Diagnosis Date   ??? SEVERE OBESITY (BMI >= 40) 07/09/2004     MORBID OBESITY   ??? MENOPAUSE 08/16/2004     FEMALE CLIMACTERIC STATE   ??? ACTINIC KERATOSIS.Marland Kitchen 02/12/2003     ACTINIC KERATOSIS   ??? DM 2, UNCONTROLLED, W DIABETIC POLYNEUROPATHY    ??? ESSENTIAL HTN    ???  HYPERLIPIDEMIA    ??? GOUT           Outpatient Prescriptions Marked as Taking for the 11/02/10 encounter (Office Visit) with Kandy Garrison (N.P.):  ASPIRIN   81 MG ORAL TBEC DR TAB 1 TAB PO DAILY Disp: 100 Rfl: 0   VITAMIN D-3 2000 UNIT ORAL TAB Take 1-1-1/2 tabs po qd Disp: 45 Rfl: 3   ATENOLOL 100 MG ORAL TAB TAKE 1 TABLET BY MOUTH ONE TIME DAILY Disp: 100 Rfl: 3   METFORMIN 1000 MG ORAL TAB TAKE 1 TABLET BY MOUTH TWICE DAILY FOR DIABETES Disp: 120 Rfl: 3   SERTRALINE  50 MG ORAL TAB TAKE 1 TABLET BY MOUTH ONE TIME DAILY FOR 1 WEEK THEN TAKE 2 TABLETS BY MOUTH ONE TIME DAILY Disp: 60 Rfl: 3   TRAZODONE  50 MG ORAL TAB TAKE 1/2 TO 2 TABLETS BY MOUTH EVERY NIGHT AT BEDTIME AS NEEDED FOR SLEEP Disp: 60 Rfl: 3   SIMVASTATIN 40 MG ORAL TAB TAKE 1 TABLET BY MOUTH EVERY NIGHT AT BEDTIME Disp: 60 Rfl: 5   PROAIR HFA 90 MCG/ACTUATION INHL HFAA USE 2 PUFFS BY MOUTH EVERY 4 TO 6 HOURS AS NEEDED FOR WHEEZING AND COUGH Disp: 8.5 Rfl: 2   ONE TOUCH ULTRA TEST MISC STRIPS USE AS DIRECTED BY PHYSICIAN FOR DIABETES Disp: 200 Rfl: 12   GLYBURIDE 5 MG ORAL TAB TAKE 1 TABLET BY MOUTH EVERY MORNING AND TAKE 3 TABLETS EVERY EVENING FOR DIABETES Disp:  360 Rfl: 3   LISINOPRIL 10 MG ORAL TAB TAKE 1 TABLET BY MOUTH ONE TIME DAILY FOR BLOOD PRESSURE Disp: 90 Rfl: 3   ALLOPURINOL 100 MG ORAL TAB TAKE 2 TABLETS BY MOUTH ONE TIME DAILY TO PREVENT GOUT Disp: 200 Rfl: 3         LAST AVAILABLE LABS:  GLUCOSE, RANDOM   Date Value Range Status   10/25/2010 204* 80-115 (MG/DL) Final   16/11/958 454* 80-115 (MG/DL) Final   07/21/1190 478* 80-115 (MG/DL) Final        GLUCOSE, FASTING   Date Value Range Status   07/30/2006 164* 70-110 (MG/DL) Final   12/23/5619 Pending  (MG/DL) Incomplete   3/0/8657 176* 70-110 (MG/DL) Final        [id number]   Date Value Range Status   10/25/2010 8.1* 4.2-5.8 (%) Final   08/16/2010 7.1* 4.2-5.8 (%) Final   01/31/2010 7.4* 4.2-5.8 (%) Final        MICROALBUMIN, URINE   Date Value Range Status   08/16/2010 1.0  0.0-1.7 (MG/DL) Final   9/52/8413 5.0* 0.0-1.7 (MG/DL) Final   24/02/101 72.5* 0.0-1.7 (MG/DL) Final        MICROALBUMIN/CREATININE RATIO, URINE   Date Value Range Status   08/16/2010 5.5  <30- (MG/G) Final   01/31/2010 28.1  <30- (MG/G) Final   09/20/2009 78.3* <30- (MG/G) Final       CHOLESTEROL   Date Value Range Status   08/16/2010 119  <200- (MG/DL) Final        TRIGLYCERIDE   Date Value Range Status   08/16/2010 184* <150- (MG/DL) Final        HDL   Date Value Range Status   08/16/2010 31* >40- (MG/DL) Final      (NOTE)      >=60 mg/dl is Desirable        LDL DIRECT   Date Value Range Status   10/25/2010 58  30-130 (MG/DL) Final      (NOTE)  LESS THAN 100 MG/DL      OPTIMAL      161 TO 129 MG/DL         NEAR OPTIMAL/ABOVE OPTIMAL      130 TO 159 MG/DL         BORDERLINE HIGH      160 TO 189 MG/DL         HIGH      096 MG/DL AND ABOVE      VERY HIGH        LDL CALCULATED   Date Value Range Status   12/21/2004 93  50-140 (MG/DL) Final       POTASSIUM   Date Value Range Status   10/25/2010 4.4  3.5-5.0 (mmol/L) Final        SODIUM   Date Value Range Status   10/25/2010 140  135-150 (mmol/L) Final        CHLORIDE   Date Value Range Status   10/25/2010  102  99-111 (mmol/L) Final        CO2   Date Value Range Status   10/25/2010 29  22-31 (mmol/L) Final       BUN   Date Value Range Status   10/25/2010 20* 7-17 (MG/DL) Final       CREATININE   Date Value Range Status   10/25/2010 0.59  0.52-1.04 (MG/DL) Final       ALT   Date Value Range Status   08/16/2010 18  9-52 (U/L) Final        AST   Date Value Range Status   01/31/2010 38  14-48 (U/L) Final        ALKALINE PHOSPHATASE   Date Value Range Status   01/31/2010 113  38-126 (U/L) Final      (NOTE)      Normal Range for Adults only 49 years old      and above.  The Normal Range has not been      validated for children less than 97 years      old.        BILIRUBIN, TOTAL   Date Value Range Status   01/31/2010 0.3  0.2-1.3 (MG/DL) Final       TSH   Date Value Range Status   01/31/2010 2.920  0.465-4.68 (MIU/ML) Final             BP Readings from Last 3 Encounters:   11/02/10 110/68   09/30/10 130/80   08/31/10 120/72         Wt Readings from Last 3 Encounters:   11/02/10 272 lb (123.378 kg)   08/31/10 273 lb (123.832 kg)   02/25/10 293 lb (132.904 kg)         Allergies   Allergen Reactions   ??? Codeine And Opiate Derivatives Nausea and/or Vomiting   ??? Kiwi Fruit      Pt. States throat closes up    ??? Pamelor (Nortriptyline)      Severe constipation   ??? Talwin (Pentazocine Lactate) Nausea and/or Vomiting   ??? Tramadol Hcl Nausea and/or Vomiting   ??? Vicodin (Acetaminophen-Hydrocodone)      Vomiting             Immunization History   Administered Date(s) Administered   ??? INF (Influenza) unspecified formulation 09/24/1991, 08/25/1992, 10/30/2000, 10/17/2001, 10/04/2002, 09/15/2003, 09/08/2004   ??? INF H1N1-09 standard dose (Influenza H1N1-09). 11/03/2008   ??? INFs 23yrs and over (Influenza) 08/28/2008, 08/31/2010   ??? INFs 73yrs and over (  influenza) 11/29/2007   ??? PNUps (Pneumococcal polysaccharide, pneumonia) 10/10/2006   ??? Td 54yrs-adult (Tetanus, diphtheria) 04/07/1993, 10/10/2006               EXAM:  W/D W/N Caucasian Female who  is Alert, Pleasant, Cooperative and in NAD  BP 110/68   Pulse 60   Temp(Src) 97.3 ??F (36.3 ??C) (Oral)   Resp 16   Ht 5\' 7"  (1.702 m)   Wt 272 lb (123.378 kg)   BMI 42.60 kg/m2    HEART: RRR with normal S1 and S2 without murmur or gallop  LUNGS: Clear to lower lung fields  ABD: Rounded, obese, nondistended, nontender  NECK: No audible Carotid Bruits  LOWER EXTREMITIES: Pale but warm to touch without redness, edema or visible skin changes; Intact 1-2+ Pedal Pulses        ASSESSMENT:  1. TYPE 2 DIABETES: UNCONTROLLED  2. HTN - STABLE  2. HYPERLIPIDEMIA - STABLE  2. HX of PERIPHERAL EDEMA with PRN FUROSEMIDE USE  2. MEDICATION REFILLS  2. OBESITY        PLAN: Glucometer Check Now = 360 Mgs S/P Cereal with Sugar, Milk, V8 Juice, Coffee with flavored creamer   1. Most recent lab work reviewed & goals discussed for best Blood Pressure, Diabetic Control and Lipid or Cholesterol Control.   (Hemoglobin A1C Value of LESS THAN 7% for CONTROLLED DIABETIC STATE; LESS THAN 6.5% for BEST CONTROLLED STATE).    2. New Lancet Device -Ordered as per her request  Glucose Meter use reviewed with the following advice:  - Desirable Blood Sugar Goals of 80 -130 or 140 Mgs pre-meals & Less Than 150 or 160 Mgs before bedtime.    - Try to perform regular blood sugar checks before AM & PM meals Daily.   - DO NOT BE LAX in Self Assessing own control of Blood Sugars and Diabetic state. This is important for your health.  - Monitor for any Hypoglycemia or Low Blood Sugar Levels (Glucose level under 80 Mgs)   - Call to speak with your Doctor or myself  for any high readings or if persistently elevated over 200 Mgs for the majority of readings.    3. Exercise DAILY by walking, swimming, bike riding or use of a Treadmill  or a portable Pedal machine placed on the floor for 20 - 40 minutes to benefit Weight, Blood Pressure, Cardiovascular Health and Diabetic Glycemic control.    4. Advised to be aware that Diabetes can be partly Controlled with  Dietary management, Weight loss & Exercise. Dietary Considerations Include the Following :  -Remember that higher Carbohydrate foods will affect your blood sugars.  -Limit Higher Carbohydrate foods & Concentrated Sweets to smaller portion sizes  -Eat 3 Regular meals with small snacks.  DO NOT SKIP A MEAL.  -You may eat a small snack before bedtime of 10-20 Grams of Carbohydrate or Less than a 30 Gram Carbohydrate Snack  -Include more fiber in the diet such as cooked or sliced raw vegetables and salads.   -Eat all fruits with a meal rather than as a snack at night  -Limit ALL Fast Foods and processed foods as much as possible due to high fat content, high sodium & high calories.  -Drink plenty of water daily - 6 to 8 full glasses.   -Limit Caffeine and Alcoholic beverages.  Medications discussed, benefits, compliance with taking medications as prescribed reviewed.  Continue current medications and doses. CHANGES ARE AS FOLLOWS:   - Start  New Novolog Insulin per sliding scale at Breakfast & Dinner  Glucose Value of :  160 - 200 - Try 2 units  201 - 225 - 3 Units   226 - 250 - 4 Units   251 - 275 - 5 units   276 - 300 - 6 units  301 - 325  - 7 units  326 - 350  - 8 units   351 & higher - 10 Units     - Atenolol & Lasix reordered for Mail order Pharmacy to refill     6. Submit stool for hidden blood & Repeat the Fasting Lab work in Late March or early April - Tests ordered       7. Follow up as necessary or in 4-6 months. Bring your Glucose Meter to every Appointment for review.    8. Patient to call as necessary for blood sugar or meter problems or other health concerns.    INTERNAL MEDICINE LINE: [phone]    May sign up or Access On Line information at [email or web address]        See orders for this visit as documented in the electronic medical record.    Issues reviewed: low cholesterol diet, weight control and daily exercise discussed, home glucose monitoring emphasized, all medications, side effects and compliance  discussed carefully, use and side effects of insulin discussed, annual eye examinations at Ophthalmology discussed, glycohemoglobin and other lab monitoring discussed, long term diabetic complications discussed and labs immediately prior to next visit.

## 2010-11-04 NOTE — Progress Notes (Addendum)
The patient, Eileen Barnes's, identity was verified by name and dob  Chief Complaint   Patient presents with   ??? DIABETES CARE MANAGEMENT      Calling to check on pts condition  SUBJECTIVE:   Patient states she has not had any problems with taking insulin. Has not seen much change in her sugars , has had one reading of 113 after eating salad with chicken.  OBJECTIVE:   Start New Novolog Insulin per sliding scale at Breakfast & Dinner   Glucose Value of :   160 - 200 - Try 2 units   201 - 225 - 3 Units   226 - 250 - 4 Units   251 - 275 - 5 units   276 - 300 - 6 units   301 - 325 - 7 units   326 - 350 - 8 units   351 & higher - 10 Units       ASSESSMENT:  DM   Discussed concerns about low sugars and over correction by eating too much sweets.   PLAN:   Agrees to continue on insulin per sliding scale and do SMBG as needed. To call with any concerns.

## 2010-11-09 ENCOUNTER — Encounter

## 2010-11-18 ENCOUNTER — Telehealth

## 2010-11-18 NOTE — Progress Notes (Addendum)
I agree with the plan of care as defined, Novolin N Insulin ordered - Kandy Garrison, NP

## 2010-11-18 NOTE — Progress Notes (Addendum)
The patient, Eileen Barnes's, identity was verified by name and dob  Chief Complaint   Patient presents with   ??? DIABETES CARE MANAGEMENT      Calling to check on pts condition  SUBJECTIVE:   Patient states she is still having high sugars and needs to cover with insulin usually twice daily.   Fasting am   1/5 158  1/6 198    OBJECTIVE:  Diabetes 08/16/2010 10/25/2010   HBA1c 7.1 (H) 8.1 (H)   Cholesterol 119    Triglyceride 184 (H)    LDL Direct 56 58   HDL 31 (L)    Creatinine 0.60 0.59   ALT 18    Potassium 4.3 4.4   Microalbumin/CR 5.5         ASSESSMENT:  DM   Discussed different insulin and how they work. Familiar with these since husband is also on insulin.  PLAN:   Consider starting on NPH insulin 5 units bedtime and ttt to reach goals of fasting am sugars 80 -130. To prevent high fasting am SMBG.  Agreeable. Will forward to C. Smorag BSN/CRNP/CDE for review.

## 2010-11-18 NOTE — Progress Notes (Addendum)
Spoke to patient and she is agreeable to starting insulin and keeping SMBG. To call with any concerns.

## 2010-11-22 ENCOUNTER — Ambulatory Visit

## 2010-11-22 NOTE — Progress Notes (Addendum)
FOLLOW UP    Visit Time: 30 minutes    The patient, Eileen Barnes, identity was verified by name and MRN    Those attending session: patient    SYMPTOM SCREENS: Duration: since lastvisit continues report mood "fine... Love Zoloft... My family loves Zoloft"; more able to cope; anger in control; Denies vegetative symptoms; sleep good with occasional waking x1 at night but able to easily return; continues Zoloft 100mg  po qd, Trazodone 50mg  po qhs, and Vitamin D 50,000units po qweek x 12 weeks; Denies side effects.    Session content: Major stress is 71 year old granddaughter sharing with her thoughts of suicide; her son is alcoholic and has tense relationship with ex-wife but both have agreed to take .granddaughter to counseling and are supervising her 24hours/day until appointment this weekend; concerned that son's drinking is contributing to her emotional issues; pleased at coping through holidays despite increased visitors and stress; husband retired 1 month ago and pleased to have him home,    MENTAL STATUS    Mental Status:appearance: appropriately dressed, appropriately groomed, good hygiene, light make-up, hair styled, affect: congruent with mood and appropriate, attitude: Cooperative, mood: calm, bright, easily verbal, cooperative, personable, speech: appropriate, thought content: no evidence of psychosis, thought process; logical, coherent, orientation: oriented in all spheres, memory: recent:  good and remote:  good, insight: fair, judgment: good, cognitive: intact and motor behavior: normal    RISK ASSESSMENT    Suicide screen: denies current suicidal ideation, plan and intent    Homicide screen: denies homicidal ideation, plan and intent    CLINICAL ASSESSMENT    Clinical assessment: Mood symptoms continue improved and coping well overall with treatment and self intervention.    DIAGNOSIS:    AXIS I:   Diagnosis   1. 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 Client verbalizes understanding and agrees to above plan.    GOALS, ASSIGNMENTS    Interventions in session: Gave positive feedback for efforts and progress. Reviewed and reinforced self care strategies and resources including physiological factors that impact mood symptoms and stress discussed and reinforced. Educated about medications including options, risks, and benefits.    Goals: Continue to more effectively cope with mood symptoms and stress.    Assignments: As above. Continue Zoloft 100mg  po qd, Trazodone 50-100mg  po qd prn sleep, and Vitamin D 50,000units po qweek x 12 weeks then recheck level    Pt verbalizes understanding of and agreement with tx recommendation and plan - yes    NEXT STEPS    Recommended next steps: call as needed; follow up with me 3-4 months and prn.

## 2010-11-23 NOTE — Progress Notes (Addendum)
The patient, Eileen Barnes's, identity was verified by name and dob  Chief Complaint   Patient presents with   ??? DIABETES CARE MANAGEMENT      Calling to check on pts condition  SUBJECTIVE:   States no problems taking insulin, has been on 5 units pm.    Fasting am   1/8 212  1/9 227  1/10 235  1/11 200    OBJECTIVE:     Diabetes 08/16/2010 10/25/2010   HBA1c 7.1 (H) 8.1 (H)   Cholesterol 119    Triglyceride 184 (H)    LDL Direct 56 58   HDL 31 (L)    Creatinine 0.60 0.59   ALT     Potassium 4.3 4.4   Microalbumin/CR 5.5        ASSESSMENT:  DM    PLAN:   Agrees to increase insulin one unit daily for ttt goals. I will call her on 11/25/10. To call with any concerns.

## 2010-11-25 NOTE — Progress Notes (Addendum)
The patient, Eileen Barnes's, identity was verified by name and dob  Chief Complaint   Patient presents with   ??? DIABETES CARE MANAGEMENT      Calling to check on pts condition  SUBJECTIVE:   States continues on insulin ,last night 7 units.    fasting am  1/12 201  1/13 227    OBJECTIVE:     Diabetes 01/31/2010 08/16/2010 10/25/2010   HBA1c 7.4 (H) 7.1 (H) 8.1 (H)   Cholesterol 141 119    Triglyceride 183 (H) 184 (H)    LDL Direct 82 56 58   HDL 38 (L) 31 (L)    Creatinine  0.60 0.59   ALT 24 18    Potassium  4.3 4.4   Microalbumin/CR 28.1 5.5        ASSESSMENT:  DM    PLAN:   Agrees  To continue to increase insulin ttt.  I will call her on  Monday.

## 2010-11-28 ENCOUNTER — Telehealth

## 2010-11-29 NOTE — Progress Notes (Addendum)
May advise her to Increase HS Insulin dose to 12, 15,18 Units etc. and adjust by 2-3 Units after 1-3 days to reach Desirable AM glucose goal . Stop increasing dose upwards when AM Glucose has reached 120-130 Mgs - C. Saquan Furtick CRNP

## 2010-11-29 NOTE — Progress Notes (Addendum)
Spoke to patient she had fasting am reading today of 212 after using 11 units insulin bedtime last night. Agrees to increase to 14 units tonight. I will call her tomorrow.

## 2010-11-29 NOTE — Progress Notes (Addendum)
The patient, Eileen Barnes's, identity was verified by name and dob  Chief Complaint   Patient presents with   ??? DIABETES CARE MANAGEMENT        Calling to check on pts condition  SUBJECTIVE:   Taking 10 units insulin bedtime last night.    Fasting am   1/14 193  1/15 215   1/16 198     States"I really am hungry" trying hard to follow diet.  OBJECTIVE:     Diabetes 01/31/2010 08/16/2010 10/25/2010   HBA1c 7.4 (H) 7.1 (H) 8.1 (H)   Cholesterol 141 119    Triglyceride 183 (H) 184 (H)    LDL Direct 82 56 58   HDL 38 (L) 31 (L)    Creatinine  0.60 0.59   ALT 24 18    Potassium  4.3 4.4   Microalbumin/CR 28.1 5.5         ASSESSMENT:  DM    PLAN:   Agreeable to doing twice daily SMBG. Consider increasing insulin ttt.

## 2010-11-30 NOTE — Progress Notes (Addendum)
The patient, Eileen Barnes's, identity was verified by name and dob  Chief Complaint   Patient presents with   ??? DIABETES CARE MANAGEMENT      Calling to check on pts condition  SUBJECTIVE:   Used 15 units insulin last night and this fasting am was 214, she did have english muffin at 10:30 last night.    OBJECTIVE:     Diabetes 08/16/2010 10/25/2010   HBA1c 7.1 (H) 8.1 (H)   Cholesterol 119    Triglyceride 184 (H)    LDL Direct 56 58   HDL 31 (L)    Creatinine 0.60 0.59   ALT 18    Potassium 4.3 4.4   Microalbumin/CR 5.5        ASSESSMENT:  DM    PLAN:   Agrees to increaseinsulin to 18 units tonight. I will call her on Friday and she is to  Call with any concerns.

## 2010-12-02 ENCOUNTER — Telehealth

## 2010-12-02 NOTE — Progress Notes (Addendum)
The patient, Eileen Barnes, identity was verified by name and dob  Chief Complaint   Patient presents with   ??? DIABETES CARE MANAGEMENT      Calling to check on pts condition  SUBJECTIVE:   Used 20 units insulin last night and this fasting am SMBG was 173.    OBJECTIVE:     Diabetes 08/16/2010 10/25/2010   HBA1c 7.1 (H) 8.1 (H)   Cholesterol 119    Triglyceride 184 (H)    LDL Direct 56 58   HDL 31 (L)    Creatinine 0.60 0.59   ALT 18    Potassium 4.3 4.4   Microalbumin/CR 5.5        ASSESSMENT:  DM    PLAN:   To increase insulin to 22 units bedtime as ordered. Agreeable.I will call on Monday and she is to call with any concerns.

## 2010-12-05 NOTE — Progress Notes (Addendum)
The patient, Eileen Barnes's, identity was verified by name and dob  Chief Complaint   Patient presents with   ??? DIABETES CARE MANAGEMENT        Calling to check on pts condition  SUBJECTIVE:   using 22 units if insulin now and SMBG   Fasting am  1/21 178  1/22 178  1/23 172    OBJECTIVE:     Diabetes 08/16/2010 10/25/2010   HBA1c 7.1 (H) 8.1 (H)   Cholesterol 119    Triglyceride 184 (H)    LDL Direct 56 58   HDL 31 (L)    Creatinine 0.60 0.59   ALT 18    Potassium 4.3 4.4   Microalbumin/CR 5.5        ASSESSMENT:  DM    PLAN:   To increase insulin to 25 units. To call with any concerns. I will call her on Thursday. Agreeable.

## 2010-12-08 NOTE — Progress Notes (Addendum)
The patient, Eileen Barnes's, identity was verified by name and dob  Chief Complaint   Patient presents with   ??? DIABETES CARE MANAGEMENT      Calling to check on pts condition  SUBJECTIVE:   Taking 25 units insulin bedtime.   Fasting am  1/24 180  1/25 194  1/26 206    OBJECTIVE:     Diabetes 08/16/2010 10/25/2010   HBA1c 7.1 (H) 8.1 (H)   Cholesterol 119    Triglyceride 184 (H)    LDL Direct 56 58   HDL 31 (L)    Creatinine 0.60 0.59   ALT 18    Potassium 4.3 4.4   Microalbumin/CR 5.5        ASSESSMENT:  DM    PLAN:   Agrees to increase insulinto 28 units bedtime and will start SMBG twice daily. I will call her on 12/12/10.

## 2010-12-12 NOTE — Progress Notes (Addendum)
The patient, Eileen Barnes's, identity was verified by name and dob  Chief Complaint   Patient presents with   ??? DIABETES CARE MANAGEMENT      Calling to check on pts condition  SUBJECTIVE:   Fasting am Before dinner  1/28 177  97  1/29 176   30 units insulin  1/30 142      OBJECTIVE:     Diabetes 08/16/2010 10/25/2010   HBA1c 7.1 (H) 8.1 (H)   Cholesterol 119    Triglyceride 184 (H)    LDL Direct 56 58   HDL 31 (L)    Creatinine 0.60 0.59   ALT 18    Potassium 4.3 4.4   Microalbumin/CR 5.5        ASSESSMENT:  DM    PLAN:   Continue on 30 units insulin this week and I will call on Friday. To call with any concerns.

## 2010-12-13 NOTE — Letter (Signed)
Summary: Nadara Eaton letter  [facility]  [address] [place], [place] [address]   Phone: [phone]  Fax: [phone]       06/21/2010 MRN: [id number]  [facility] [address] [place], [place]  [address]  Dear Ms. Welch,  [facility] [place], and [facility] announce the retirement of Arta Silence, M.D., from full-time practice at the [facility] [facility] office effective May 12, 2010 and his plans of returning part-time.  It is important to Dr. Hetty Ely and to our practice that you understand that [facility] has seven physicians in our office for your health care needs.  We will continue to offer the same exceptional care that you have today.    Dr. Hetty Ely has spoken to many of you about his plans for retirement and returning part-time in the fall.   We will continue to work with you through the transition to schedule appointments for you in the office and meet the high standards that [facility] is committed to.   Again, it is with great pleasure that we share the news that Dr. Hetty Ely will return to [facility] in October of 2011 with a reduced schedule.    If you have any questions, or would like to request an appointment with one of our physicians, please call us at [phone] and press the option for Scheduling an appointment.  We take pleasure in providing you with excellent patient care and look forward to seeing you at your next office visit.  Our [facility] Physicians are:  Tillman Abide, M.D. Laurita Quint, M.D. Roxy Manns, M.D. Kerby Nora, M.D. Hannah Beat, M.D. Ruthe Mannan, M.D. We proudly welcomed Raechel Ache, M.D. and Eustaquio Boyden, M.D. to the practice in July/August 2011.  Sincerely,  [facility]

## 2010-12-13 NOTE — Letter (Signed)
Summary: Dr.Sam Morayati,Endocrinology,Note  Dr.Sam Morayati,Endocrinology,Note   Imported By: Beau Fanny 04/06/2010 10:54:11  _____________________________________________________________________  External Attachment:    Type:   Image     Comment:   External Document

## 2010-12-13 NOTE — Assessment & Plan Note (Signed)
Summary: DRAINAGE,BACK PAIN/CLE   Vital Signs:  Patient profile:   60 year old female Height:      65 inches Weight:      159.8 pounds BMI:     26.69 Temp:     98.4 degrees F oral Pulse rate:   80 / minute Pulse rhythm:   regular BP sitting:   110 / 70  (left arm) Cuff size:   regular  Vitals Entered By: Benny Lennert CMA Duncan Dull) (February 07, 2010 10:59 AM)  History of Present Illness: Chief complaint Drain age and back pain  60 year old female:  Had a cold and some sore throat the friday before last and started to get somoe drainage. Now her throat drainage and head is now clear.   Coughed a little. Now back on back on both sides is hurting.  Hammering and everything like that.   10 days ago.    Acute Visit History:      The patient complains of cough, nasal discharge, and sore throat.  These symptoms began 2 weeks ago.  She denies abdominal pain, constipation, earache, fever, headache, nausea, and rash.        There is no history of wheezing, sleep interference, shortness of breath, respiratory retractions, tachypnea, cyanosis, or interference with oral intake associated with her cough.        'Cold' or URI symptoms have been present with the sore throat.  There is no history of dysphagia, drooling, or recent exposure to strep.        Urine output has been normal.  She is tolerating clear liquids.        Allergies: 1)  ! Sulfa 2)  ! Penicillin  Past History:  Past medical, surgical, family and social histories (including risk factors) reviewed, and no changes noted (except as noted below).  Past Medical History: Reviewed history from 11/27/2007 and no changes required. Hyperlipidemia:(02/2002) Hypothyroidism(:04/2002)  Past Surgical History: Reviewed history from 11/27/2007 and no changes required. TONSILLECTOMY 1950S PILONIDAL CYSTECTOMY 1970 OVARIAN CYSTECTOMY 1976 HYSTERECTOMY WITH BILATERIAL OOPHERECTOMY:(1987) SIGMOIDOSCOPY:-[facility]) (1999) THYROID  NUCLEAR SCAN-- COLD NODULE:(03/1999) THYROID ULTRASOUND ; HYPOECHOIC MASS:(03/1999) STRESS CARDIOLITE WITHIN NORMAL LIMITS EF 79%:(10/15/2000) COLONOSCOPY-(DR.STARK) --NO FINDINGS ; INTERNAL HEMRROIDS:(04/2004) HEAD CT WITHOUT CONTRAST ;NORMAL:(11/26/2005) FNA THYROID:(08/27/2007)  Family History: Reviewed history from 12/10/2007 and no changes required. Father: DECEASED AT 76 YOA ; CANCER OF COLON Mother:  ALIVE AT 82  HTN,ARTHRITIS, LYMPHOMA BROTHER A ETOH BROTHER A  BROTHER A  SISTER  A HTN,ESOPH. EROSION SISTER  A  HTN SISTER A CV: NEGATIVE HBP: + MOTHER ,+SISTERS DM: NEGATIVE GOUT/ARTHRITIS: PROSTATE CANCER: BREAST/OVARIAN/UTERINECANCER: COLON CANCER: + FATHER DEPRESSION: NEGATIVE ETOH/DRUG ABUSE: +ETOH BROTHER OTHER: NEGATIVE STROKE  Social History: Reviewed history from 12/10/2007 and no changes required. Marital Status: Married LIVES WITH HUSBAND  Children: 1 DAUGHTER Occupation: Warehouse manager  Review of Systems       REVIEW OF SYSTEMS GEN: Acute illness details above. CV: No chest pain or SOB GI: No noted N or V Otherwise, pertinent positives and negatives are noted in the HPI.   Physical Exam  Additional Exam:  GEN: A and O x 3. WDWN. NAD.    ENT: Nose clear, ext NML.  No LAD.  No JVD.  TM's clear. Oropharynx clear.  PULM: Normal WOB, no distress. No crackles, wheezes, rhonchi. CV: RRR, no M/G/R, No rubs, No JVD.   ABD: S, NT, ND, + BS. No rebound. No guarding. No HSM.   EXT: warm and well-perfused, No c/c/e.  PSYCH: Pleasant and conversant.    Impression & Recommendations:  Problem # 1:  BRONCHITIS- ACUTE (ICD-466.0) Assessment New viral pathogen vs. ? Walking PNA I am not clear in this case Watchful waiting, if worsens or symptoms worse, OK to start azitro  The following medications were removed from the medication list:    Tessalon 200 Mg Caps (Benzonatate) ..... One tab by mouth three times a day Her updated medication list for  this problem includes:    Azithromycin 250 Mg Tabs (Azithromycin) .Marland Kitchen... 2 by  mouth today and then 1 daily for 4 days  Complete Medication List: 1)  Azithromycin 250 Mg Tabs (Azithromycin) .... 2 by  mouth today and then 1 daily for 4 days Prescriptions: AZITHROMYCIN 250 MG  TABS (AZITHROMYCIN) 2 by  mouth today and then 1 daily for 4 days  #6 x 0   Entered and Authorized by:   Hannah Beat MD   Signed by:   Hannah Beat MD on 02/07/2010   Method used:   Print then Give to Patient   RxID:   [id number]   Prior Medications (reviewed today): None Current Allergies (reviewed today): ! SULFA ! PENICILLIN

## 2010-12-13 NOTE — Assessment & Plan Note (Signed)
Summary: uti/alc   Vital Signs:  Patient profile:   60 year old female Height:      65 inches Weight:      156 pounds BMI:     26.05 Temp:     98.6 degrees F oral Pulse rate:   64 / minute Pulse rhythm:   regular BP sitting:   130 / 82  (left arm) Cuff size:   regular  Vitals Entered By: Linde Gillis CMA Duncan Dull) (September 19, 2010 4:03 PM) CC: ? UTI   History of Present Illness: 60 yo here for ? UTI.  Feels the way she did with last UTI- since yesterday, increased urinary frequency, suprapubic pressure. Has some back pain this morning.  No fevers or chills. No n/v/d. No visible hematuria. No dysuria.  Allergies: 1)  ! Sulfa 2)  ! Penicillin  Review of Systems      See HPI General:  Denies chills and fever. GI:  Denies loss of appetite, nausea, and vomiting.  Physical Exam  General:  Well-developed,well-nourished,in no acute distress; alert,appropriate and cooperative throughout examination, some coughing initially. Abdomen:  No suprapubic tenderness. No CVA tenderness Psych:  Cognition and judgment appear intact. Alert and cooperative with normal attention span and concentration. No apparent delusions, illusions, hallucinations   Impression & Recommendations:  Problem # 1:  UTI'S, RECURRENT (ICD-599.0) Assessment Deteriorated UA consistent with UTI. Will treat with cipro 500 mg two times a day x 7 days. Pt feels she does not need pyridium this time. The following medications were removed from the medication list:    Cipro 500 Mg Tabs (Ciprofloxacin hcl) ..... One tab by mouth two times a day    Pyridium 200 Mg Tabs (Phenazopyridine hcl) ..... One tab by mouth three times a day Her updated medication list for this problem includes:    Cipro 500 Mg Tabs (Ciprofloxacin hcl) .Marland Kitchen... 1 by mouth 2 times daily x 7 days  Orders: UA Dipstick w/o Micro (manual) (91478) T-Culture, Urine ([id number])  Complete Medication List: 1)  Cipro 500 Mg Tabs (Ciprofloxacin  hcl) .Marland Kitchen.. 1 by mouth 2 times daily x 7 days Prescriptions: CIPRO 500 MG TABS (CIPROFLOXACIN HCL) 1 by mouth 2 times daily x 7 days  #14 x 0   Entered and Authorized by:   Ruthe Mannan MD   Signed by:   Ruthe Mannan MD on 09/19/2010   Method used:   Electronically to        [id number] (retail)       [address]       [place], [place]  [address]       Ph: [phone] or [phone]       Fax: [phone]   RxID:   [id number]    Orders Added: 1)  UA Dipstick w/o Micro (manual) [81002] 2)  T-Culture, Urine [[id number]] 3)  Est. Patient Level IV [10932]    Current Allergies (reviewed today): ! SULFA ! PENICILLIN  Laboratory Results   Urine Tests  Date/Time Received: September 19, 2010 4:13 PM   Routine Urinalysis   Color: yellow Appearance: Clear Glucose: negative   (Normal Range: Negative) Bilirubin: negative   (Normal Range: Negative) Ketone: negative   (Normal Range: Negative) Spec. Gravity: <1.005   (Normal Range: 1.003-1.035) Blood: moderate   (Normal Range: Negative) pH: 7.0   (Normal Range: 5.0-8.0) Protein: negative   (Normal Range: Negative) Urobilinogen: 0.2   (  Normal Range: 0-1) Nitrite: negative   (Normal Range: Negative) Leukocyte Esterace: moderate   (Normal Range: Negative)       Appended Document: uti/alc

## 2010-12-13 NOTE — Letter (Signed)
Summary: Dr.Sam Morayati,[facility],Note  Dr.Sam Morayati,[facility],Note   Imported By: Beau Fanny 10/11/2010 13:50:29  _____________________________________________________________________  External Attachment:    Type:   Image     Comment:   External Document

## 2010-12-13 NOTE — Assessment & Plan Note (Signed)
Summary: CHECK ?LUMP ON LEFT SIDE/CLE   Vital Signs:  Patient profile:   60 year old female Weight:      152.50 pounds Temp:     98.6 degrees F oral Pulse rate:   60 / minute Pulse rhythm:   regular BP sitting:   140 / 100  (left arm) Cuff size:   regular  Vitals Entered By: Sydell Axon LPN (April 26, 2010 11:41 AM) CC: Urinating a lot, back pain and ? lump on left side under bra   History of Present Illness: Pt here feeling terrible , hasn't slept much the last two nights, urinating and BMs the first night and then urinating every thirty minutes last. She has not had burning, she has a warm feeling when she urinates. She has aa high tolerance for pain typically and has not had pain. But she has overwhelming frequency. She also has the sensation of a small lump under the skin iin the midaxillary line left side of chest posterior to the edge of  the lateral left breast tissue. She denies traunma of any kind.  Problems Prior to Update: 1)  Uri  (ICD-465.9) 2)  Dysuria  (ICD-788.1) 3)  Musculoskeletal Pain, Right Rhomboid  (ICD-781.99) 4)  Health Maintenance Exam  (ICD-V70.0) 5)  Fibrocystic Breast Disease, Hx of  (ICD-V13.9) 6)  Thyroid Nodule Benign By Moryati  (ICD-241.0) 7)  Heart Murmur, Hx of  (ICD-V12.50) 8)  Hypothyroidism  (ICD-244.9) 9)  Hyperlipidemia  (ICD-272.4) 10)  Menopausal Disorder  (ICD-627.9)  Medications Prior to Update: 1)  None  Allergies: 1)  ! Sulfa 2)  ! Penicillin  Physical Exam  General:  Well-developed,well-nourished,in no acute distress; alert,appropriate and cooperative throughout examination, some coughing initially. Head:  Normocephalic and atraumatic without obvious abnormalities. No apparent alopecia or balding. Sinuses NT. Eyes:  Conjunctiva clear bilaterally.  Ears:  External ear exam shows no significant lesions or deformities.  Otoscopic examination reveals clear canals, tympanic membranes are intact bilaterally without bulging,  retraction, inflammation or discharge. Hearing is grossly normal bilaterally. Nose:  External nasal examination shows no deformity or inflammation. Nasal mucosa are pink and moist without lesions or exudates. Mouth:  Oral mucosa and oropharynx without lesions or exudates.  Teeth in good repair. Mild PND. Lungs:  Normal respiratory effort, chest expands symmetrically. Lungs are clear to auscultation, no crackles or wheezes. Heart:  Normal rate and regular rhythm. S1 and S2 normal without gallop, murmur, click, rub or other extra sounds. Abdomen:  No suprapubic tenderness. Msk:  No CVAT. Skin:  Nothing felt in the left chest area postrior to the left breast over approx T$ in the mid axillary line. No difference from right side. She is mildly tender directly over the rib...denies trauma.   Impression & Recommendations:  Problem # 1:  FREQUENCY/NOCTURIA, URINARY (ICD-788.41) Assessment New  Start Cipro and Pyridium.  Puish fluids. Will culture and report.  Her updated medication list for this problem includes:    Pyridium 200 Mg Tabs (Phenazopyridine hcl) ..... One tab by mouth three times a day  Discussed use of medication.   Problem # 2:  DISTURB OF SKIN SENS/ CHEST WALL TENDERNESS (ICD-782.0) Assessment: New NOTHING FELT OF SIGNIFICANCE. OBSERVE.  Reassurance given.  Complete Medication List: 1)  Calcium Gluconate 500 Mg Tabs (Calcium gluconate) .... Takes total of 1000 mg by mouth daily 2)  Cipro 500 Mg Tabs (Ciprofloxacin hcl) .... One tab by mouth two times a day 3)  Pyridium 200 Mg Tabs (Phenazopyridine hcl) .Marland KitchenMarland KitchenMarland Kitchen  One tab by mouth three times a day  Other Orders: UA Dipstick W/ Micro (manual) (54098)  Patient Instructions: 1)  Will report culture results. Take all Ab. Prescriptions: PYRIDIUM 200 MG TABS (PHENAZOPYRIDINE HCL) one tab by mouth three times a day  #10 x 0   Entered and Authorized by:   Shaune Leeks MD   Signed by:   Shaune Leeks MD on  04/26/2010   Method used:   [facility]        [id number]       [address]       [place], [place]  [address]       Ph: [phone] or [phone]       Fax: [phone]   RxID:   [id number] CIPRO 500 MG TABS (CIPROFLOXACIN HCL) one tab by mouth two times a day  #14 x 0   Entered and Authorized by:   Shaune Leeks MD   Signed by:   Shaune Leeks MD on 04/26/2010   Method used:   [facility]        [id number]       [address]       [place], [place]  [address]       Ph: [phone] or [phone]       Fax: [phone]   RxID:   [id number]   Current Allergies (reviewed today): ! SULFA ! PENICILLIN  Laboratory Results   Urine Tests  Date/Time Received: April 26, 2010 11:55 AM  Date/Time Reported: April 26, 2010 11:55 AM   Routine Urinalysis   Color: yellow Appearance: Cloudy Glucose: negative   (Normal Range: Negative) Bilirubin: negative   (Normal Range: Negative) Ketone: negative   (Normal Range: Negative) Spec. Gravity: 1.020   (Normal Range: 1.003-1.035) Blood: trace-lysed   (Normal Range: Negative) pH: 8.0   (Normal Range: 5.0-8.0) Protein: trace   (Normal Range: Negative) Urobilinogen: 0.2   (Normal Range: 0-1) Nitrite: negative   (Normal Range: Negative) Leukocyte Esterace: moderate   (Normal Range: Negative)        Appended Document: CHECK ?LUMP ON LEFT SIDE/CLE

## 2010-12-13 NOTE — Progress Notes (Signed)
Summary: regarding uti sxs  Phone Note Call from Patient Call back at [phone]   Caller: Patient Call For: Shaune Leeks MD Summary of Call: Pt was treated for a UTI with cipro, took last pill on sunday.  She felt much better but frequency has come back- no other sxs.  She will wait a couple of days and see how she does and will call back if she gets worse. Initial call taken by: Lowella Petties CMA,  May 04, 2010 4:21 PM  Follow-up for Phone Call        Noted and agree. If sxs return, needs to be seen to reevaluate her urine. Follow-up by: Shaune Leeks MD,  May 04, 2010 4:59 PM

## 2010-12-13 NOTE — Progress Notes (Signed)
Summary: Schedule Colonoscopy   Phone Note Outgoing Call   Call placed by: Hortense Ramal CMA Duncan Dull),  November 25, 2009 12:03 PM Call placed to: Patient Summary of Call: Left message for patient to call back. She needs to schedule her recall colonoscopy due to her family history of colon cancer in her father as well as due to her age. Initial call taken by: Hortense Ramal CMA Duncan Dull),  November 25, 2009 12:04 PM  Follow-up for Phone Call        Left message for patient to call back. Hortense Ramal CMA Duncan Dull)  November 30, 2009 12:57 PM   Left message for patient to call back. Hortense Ramal CMA Duncan Dull)  December 03, 2009 10:44 AM   No call back recieved from patient. We will send a letter. Hortense Ramal CMA Duncan Dull)  December 07, 2009 10:06 AM

## 2010-12-26 NOTE — Telephone Encounter (Addendum)
Dr Lavonia Dana     Mbr asking that you contact her re concern of mole that has grown in shape, discoloration, and has become very itchy. 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 Mbr can be reached at home #. Pls adv. Thank you      Telephone Information:   Home Phone [phone]

## 2010-12-27 NOTE — Telephone Encounter (Addendum)
Spoke to pt, set up tfu, pt accepted day and time. Pt thankful and had no further questions.

## 2010-12-30 ENCOUNTER — Telehealth

## 2010-12-30 NOTE — Progress Notes (Addendum)
Called patient - 2-3 week h/o enlarging skin growth that is getting itchy, on back and possibly rubbed by bra strap.  Patient was not aware of this lesion before, but husband thinks there was a mole there.  Hx BCC in past, pt very concerned.    Plan:  Offered patient acute slot for 7:45 on Fri Feb 24 - [place] - pt grateful for appt.    Nursing - please schedule as above.

## 2011-01-05 ENCOUNTER — Ambulatory Visit (INDEPENDENT_AMBULATORY_CARE_PROVIDER_SITE_OTHER): Payer: Self-pay | Admitting: Family Medicine

## 2011-01-05 ENCOUNTER — Encounter: Payer: Self-pay | Admitting: Family Medicine

## 2011-01-05 DIAGNOSIS — J069 Acute upper respiratory infection, unspecified: Secondary | ICD-10-CM

## 2011-01-06 NOTE — Patient Instructions (Addendum)
Do you understand your plan of care today? Is there anything else I can help you with today? You may be getting a survey in the mail regarding your care today. If you do please take a few minutes to fill it out and mail it back to [place]. It would be greatly appreciated.    Immediately after your treatment with Liquid Nitrogen,the area will burn for about 20 minutes.   They will redden and a blister may form within 24 hours. Do not open the blister.   Later the area becomes crusted and dry apply vasline twice daily . No dressing is necessary. Wash in your usual fashion.. Occasionally an area needs to be treated again if lesion is not completely healed.    RECALL  MADE FOR 6 month

## 2011-01-06 NOTE — Progress Notes (Addendum)
60 year old patient here for URG appt, new very itchy spot on back x 1-2 months, using husband's backscratcher, see email.  Pt also aware of red flaky spots on R forehead and L chest.  Hx BCC, L distal leg and R upper shoulder  Hard of hearing, reads lips    ROS: pt feels well; No other skin complaints or changing lesions.       OBJECTIVE: Pleasant well-appearing female. Fair-skin type.  BP 136/80  Pulse 78   Temp(Src) 97.9 ??F (36.6 ??C) (Oral)   Resp 18   Ht 5\' 7"  (1.702 m)   Wt 272 lb (123.378 kg)   BMI 42.60 kg/m2  Upper body skin exam of face, neck, chest, abdomen, back and bilateral upper extremities shows:  - stuck-on 1 cm papule of R mid-back, bra strap line  - 2-4 mm AK of R forehead and L chest  - well-healed surgical scar sites of previous BCC      ASSESSMENT/PLAN:    SEBORRHEIC KERATOSIS, INFLAMED  (primary encounter diagnosis)  Note: back, symptomatic   Plan: DESTRUCTION SEBORRHEIC KERATOSIS, UP TO 14         LESIONS          Liquid nitrogen destruction x 10 seconds to noted lesion, wound care discussed     ACTINIC KERATOSIS  Note: 2 lesions   Plan: DESTRUCTION ACTINIC KERATOSIS , FIRST LESION,         DESTRUCTION ACTINIC KERATOSIS , 2 THROUGH 14         LESIONS, EACH          Liquid nitrogen destruction x 6-8 seconds to noted lesions, wound care discussed     HX OF BASAL CELL CA OF SKIN  Note: L leg, R upper posterior shoulder (Drs. Dennie Maizes)      FOLLOW-UP: 6 mos

## 2011-01-10 NOTE — Assessment & Plan Note (Signed)
Summary: CONGESTION,DRAINAGE,COUGH/CLE  NO INSURANCE   Vital Signs:  Patient profile:   60 year old female Weight:      159.50 pounds Temp:     98.3 degrees F oral Pulse rate:   68 / minute Pulse rhythm:   regular BP sitting:   104 / 68  (left arm) Cuff size:   regular  Vitals Entered By: Sydell Axon LPN (January 05, 2011 10:44 AM) CC: Head congestion, sinus drainage and a lot of mucus   History of Present Illness: Pt has no insurance currently but thinks she will be getting it in the Fall....will give Tdap then. She is here for congestion. She has no fever or chills. She denies headache. She is draining so no pressure. No ear pain but has some ear congestion, lots of rhinitis which has been going on for five days, for which she thinks she needs an antibiotic. Her throat is not sore but has "glob" in the back of her throat, she also has minimal cough but things are so thick it will not move. She denies N/v, no diarrhea. She is moinimally achey in the back.  She has taken Benadryl Allergy for runny nose.  Allergies: 1)  ! Sulfa 2)  ! Penicillin  Physical Exam  General:  Well-developed,well-nourished,in no acute distress; alert,appropriate and cooperative throughout examination, some congestion. Head:  Normocephalic and atraumatic without obvious abnormalities. No apparent alopecia or balding. Sinuses NT. Eyes:  Conjunctiva clear bilaterally.  Ears:  External ear exam shows no significant lesions or deformities.  Otoscopic examination reveals clear canals, tympanic membranes are intact bilaterally without bulging, retraction, inflammation or discharge. Hearing is grossly normal bilaterally. Nose:  External nasal examination shows no deformity or inflammation. Nasal mucosa are pink and moist without lesions or exudates. Min clear discharge. Mouth:  Oral mucosa and oropharynx without lesions or exudates.  Teeth in good repair. Mild PND. Neck:  No deformities, masses, or tenderness  noted. Lungs:  Normal respiratory effort, chest expands symmetrically. Lungs are clear to auscultation, no crackles or wheezes. Heart:  Normal rate and regular rhythm. S1 and S2 normal without gallop, murmur, click, rub or other extra sounds.   Impression & Recommendations:  Problem # 1:  URI (ICD-465.9) Assessment New  See instructions. Reassured this could last 4 weeks. Her updated medication list for this problem includes:    Benadryl 25 Mg Tabs (Diphenhydramine hcl) .Marland Kitchen... As needed  Instructed on symptomatic treatment. Call if symptoms persist or worsen.   Complete Medication List: 1)  Benadryl 25 Mg Tabs (Diphenhydramine hcl) .... As needed 2)  Erythromycin Base 333 Mg Tbec (Erythromycin base) .... One tab by mouth three times a day with food  Patient Instructions: 1)  Take Guaifenesin by [facility] or [facility] and getting MUCOUS RELIEF EXPECTORANT (400mg ), take 11/2 tabs by mouth AM and NOON. 2)  Drink lots of fluids anytime taking Guaifenesin.  3)  Use Zinc lozenges. 4)  Use Delsym per label at night as needed for cough. 5)  If sxs worsen, start Emycin. Prescriptions: ERYTHROMYCIN BASE 333 MG TBEC (ERYTHROMYCIN BASE) one tab by mouth three times a day with food  #30 x 0   Entered and Authorized by:   Shaune Leeks MD   Signed by:   Shaune Leeks MD on 01/05/2011   Method used:   Print then Give to Patient   RxID:   [id number]    Orders Added: 1)  Est. Patient Level II [56433]  Current Allergies (reviewed today): ! SULFA ! PENICILLIN

## 2011-01-20 NOTE — Telephone Encounter (Addendum)
Yes , please transfer her to me.

## 2011-01-20 NOTE — Telephone Encounter (Addendum)
Eileen Barnes,    This patient was previously on Tiffany's panel that I took over but I see that you have been actively managing her. Do you want to continue? If so, I will transfer her to you in CMTS. Let me know.    Thanks,    Rolley Sims R.N., B.S.N. CCP  Population Care Management and Prevention

## 2011-01-20 NOTE — Telephone Encounter (Addendum)
Transferred, thanks    [employer]

## 2011-02-06 ENCOUNTER — Telehealth

## 2011-02-06 NOTE — Telephone Encounter (Addendum)
Eileen Barnes can you assist please? Thanks

## 2011-02-06 NOTE — Telephone Encounter (Addendum)
Meds reordered as requested.  Please call in Scripts to [facility] in [place] - C. Tonnie Friedel CRNP

## 2011-02-06 NOTE — Telephone Encounter (Addendum)
Scripts called in, mbr notified

## 2011-02-06 NOTE — Telephone Encounter (Addendum)
Mbr requesting to transfer her prescription to the [facility]  Name of medication:   1. Medication: LISINOPRIL 10 MG ORAL TAB    Qty: 90     Ref: 3  Start: 11/09/10    End:               Route:                       DAW:          Sig: TAKE 1 TABLET BY MOUTH ONE TIME DAILY FOR BLOOD PRESSURE  2. Metformin 1000 mg #180 1t po bid      Pharmacy Name:[facility]    Pharmacy Address:  [facility]    Pharmacy Phone Number: [phone]   Faxed [phone]    Contact number for member:   Telephone Information:   Home Phone [phone]        Patient instructed to contact the pharmacy prior to picking up the medication.    Patient advised that office/PCP has 24-48 business hours to return their call.

## 2011-02-07 ENCOUNTER — Encounter

## 2011-02-10 ENCOUNTER — Telehealth

## 2011-02-10 NOTE — Progress Notes (Addendum)
Spoke to patient  Eileen Barnes   3/30  185  "most am smbg are about this"    02/10/2011 4:26 PM received a verbal order for split insulin dose to 30 units pm and 15 units am  from C. Smorag BSN/CRNP/CDE  .  Order repeated back to C. Smorag BSN/CRNP/CDE   and order confirmed.  Diagnosis to be associated with order dm, diagnosis confirmed.  Member agrees to splilt dose and keep twice daily smbg to call with any concerns. Reviewed smbg goals.

## 2011-02-10 NOTE — Progress Notes (Addendum)
Left mess on tad to call back at cell # and no answer at home #.

## 2011-02-13 ENCOUNTER — Telehealth

## 2011-02-14 NOTE — Progress Notes (Addendum)
Spoke with member agrees to increase insulin as ordered.   Fasting am 2hr after breakfast 4 hr after breakfast  4/2 157  343   144  4/3 156    Will call in one week for smbg.

## 2011-02-14 NOTE — Progress Notes (Addendum)
Spoke with member she has split her insulin dose--30 units pm and 15 units am     Fasting am  2hr after breakfast  4 hr after breakfast    3/31 187(had one slice pizza)    232  4/1 198  4/2 157(had bran cereal)   343            -Reviewed labels and determined this was 70 grams carbs.   Continues on metformin and glipizide as ordered.  Will send for review.       The patient, Eileen Barnes's, identity was verified by name and dob  Chief Complaint   Patient presents with   ??? DIABETES CARE MANAGEMENT     chaperoneoffered - declined        SUBJECTIVE:    Social History    History     Social History   ??? Marital Status: Unknown     Spouse Name: Married     Number of Children: N/A   ??? Years of Education: N/A     Occupational History   ??? Retired Public house manager       Social History Main Topics   ??? Smoking status: Former Smoker     Quit date: 11/13/1993   ??? Smokeless tobacco: Never Used   ??? Alcohol Use: No   ??? Drug Use: No   ??? Sexually Active: Not Currently -- Female partner(s)      ascus (=) HPV with cryo     Other Topics Concern   ??? Not on file     Social History Narrative   ??? No narrative on file        reports that she quit smoking about 17 years ago. She has never used smokeless tobacco.  She reports that she does not currently drink alcohol or use illicit drugs.    Calling to check pts condition.    OBJECTIVE:    BP Readings from Last 3 Encounters:   01/06/11 136/80   11/02/10 110/68   09/30/10 130/80       LAST DIABETIC RETINAL EXAM (Ordered Date): 04/20/10    Immunization History   Administered Date(s) Administered   ??? INF (Influenza) unspecified formulation 09/24/1991, 08/25/1992, 10/30/2000, 10/17/2001, 10/04/2002, 09/15/2003, 09/08/2004   ??? INF H1N1-09 standard dose (Influenza H1N1-09). 11/03/2008   ??? INFs 86yrs and over (Influenza) 08/28/2008, 08/31/2010   ??? INFs 80yrs and over (influenza) 11/29/2007   ??? PNUps (Pneumococcal polysaccharide, pneumonia) 10/10/2006   ??? Td 47yrs-adult (Tetanus, diphtheria) 04/07/1993, 10/10/2006          Allergies   Allergen Reactions   ??? Codeine And Opiate Derivatives Nausea and/or Vomiting   ??? Kiwi Fruit      Pt. States throat closes up    ??? Pamelor (Nortriptyline)      Severe constipation   ??? Talwin (Pentazocine Lactate) Nausea and/or Vomiting   ??? Tramadol Hcl Nausea and/or Vomiting   ??? Vicodin (Acetaminophen-Hydrocodone)      Vomiting         ASSESSMENT:  DM 2 Uncontrolled  Hyperlipidemia      PLAN:  Please remove any meds not checked.

## 2011-02-14 NOTE — Progress Notes (Addendum)
Reviewed & may continue to adjust insulin doses & advise 35 Units at Night and try 20 Units in AM - C. Carime Dinkel CRNP

## 2011-02-15 ENCOUNTER — Telehealth

## 2011-02-15 NOTE — Telephone Encounter (Addendum)
Below prescription for Furosemide called to [facility] - pharmacist [facility]    Left message on patient TAD that prescription has been called in to [facility]

## 2011-02-15 NOTE — Telephone Encounter (Addendum)
Name of medication:  FUROSEMIDE 20 MG ORAL TAB Prescription #:[id number] Qty:100 Ref:2/2 Sold:11/02/2010 End:11/02/2011 Route:Oral DAW:No OZH:YQMV 1 TO 2 TABLETS BY MOUTH ONE TIME DAILY FOR FLUID RETENTION AS NEEDED Patient Label HQI:ONGE 1 TO 2 TABLETS BY MOUTH ONE TIME DAILY FOR FLUID RETENTIONAS NEEDED     Pt states she is to get a three-month supply.    Thank you.    Pharmacy Name: [facility]    Pharmacy Address:  [address], [place], [place]    Pharmacy Phone Number: [phone]    Contact number for member:   Telephone Information:   Home Phone [phone]   Work Phone Not on file.   Mobile [phone]     Patient instructed to contact the pharmacy prior to picking up the medication.    Patient advised that office/PCP has 24-48 business hours to return their call.

## 2011-02-15 NOTE — Telephone Encounter (Addendum)
Furosemide reordered. Please call to [facility] - C. Christia Domke CRNP

## 2011-02-20 ENCOUNTER — Telehealth

## 2011-02-22 NOTE — Progress Notes (Addendum)
Spoke with member , taking insulin as ordered.    Fasting am Before lunch  4/3   135  4/4 134  4/5 126  4/6 162  180  4/7 X  4/8 199  4/9 166  168   Agreeable to increasing insulin to 22 units am and 35units pm, will be out of town for next week and I will call her 03/01/11.   Please sign pending insulin orders.

## 2011-02-22 NOTE — Progress Notes (Addendum)
Agree with plan & Orders Approved - C. Carilyn Woolston CRNP

## 2011-02-22 NOTE — Progress Notes (Addendum)
Chrisandra Carota (R.N.) 02/14/2011 8:31 AM Signed   Spoke with member agrees to increase insulin as ordered.   Fasting am 2hr after breakfast 4 hr after breakfast   4/2 157 343 144   4/3 156   Will call in one week for smbg.

## 2011-02-22 NOTE — Progress Notes (Addendum)
Left mess on tad to call back.

## 2011-02-25 ENCOUNTER — Encounter

## 2011-03-01 NOTE — Progress Notes (Addendum)
The patient, Onesti A Beaubrun's, identity was verified by name and dob  Chief Complaint   Patient presents with   ??? DIABETES CARE MANAGEMENT      Calling to check on pts condition  SUBJECTIVE:   Fasting am Before lunch Bedtime  4/13 152    271  4/14 150  4/15 172  4/16 133  4/17 127  113  4/18 135    OBJECTIVE:     Diabetes 08/16/2010 10/25/2010   HBA1c 7.1 (H) 8.1 (H)   Cholesterol 119    Triglyceride 184 (H)    LDL Direct 56 58   HDL 31 (L)    Creatinine 0.60 0.59   ALT 18    Potassium 4.3 4.4   Microalbumin/CR 5.5      LAST DIABETIC RETINAL EXAM (Ordered Date): 04/20/10    BP Readings from Last 3 Encounters:   01/06/11 136/80   11/02/10 110/68   09/30/10 130/80     On ALL therapy  l  ASSESSMENT:  DM   Taking 20 units insulin in am and 35 units insulin in pm  PLAN:   Agrees to continue medications as ordered. To call with any concerns. Agreeable to making appt to see C. Smorag BSN/CRNP/CDE in June.

## 2011-03-14 ENCOUNTER — Ambulatory Visit

## 2011-03-14 NOTE — Progress Notes (Addendum)
FOLLOW UP    Visit Time: 30 minutes    The patient, Eileen Barnes, identity was verified by name and MRN    Those attending session: patient    SYMPTOM SCREENS: Duration: since lastvisit continues report mood "fine... Good in almost every way"; coping well; anger in control; Denies vegetative symptoms; sleep good; continues Zoloft 100mg  po qd, Trazodone 50mg  po qhs, Denies side effects; discontinue Vitamin D3 over-the-counter with ? Causing GI difficulties.    Session content: Enjoying time with husband; went on golf trip together which was positive; pleased at abilityto set limits and boundaries with children with positive success.     MENTAL STATUS    Mental Status:appearance: appropriately dressed, appropriately groomed, good hygiene, light make-up, hair styled, affect: congruent with mood and appropriate, attitude: Cooperative, mood: calm, bright, easily verbal, cooperative, personable, speech: appropriate, thought content: no evidence of psychosis, thought process; logical, coherent, orientation: oriented in all spheres, memory: recent:  good and remote:  good, insight: fair, judgment: good, cognitive: intact and motor behavior: normal    RISK ASSESSMENT    Suicide screen: denies current suicidal ideation, plan and intent    Homicide screen: denies homicidal ideation, plan and intent    CLINICAL ASSESSMENT    Clinical assessment: Mood symptoms continue improved and coping well overall with treatment and self intervention.    DIAGNOSIS:    AXIS I:   Diagnosis   1. 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 Client verbalizes understanding and agrees to above plan.    GOALS, ASSIGNMENTS    Interventions in session: Gave positive feedback for efforts and progress. Reviewed and reinforced self care strategies and resources including physiological factors that impact mood symptoms and stress discussed and reinforced. Educated about medications including options, risks, and benefits.    Goals: Continue to more effectively cope with mood symptoms and stress.    Assignments: As above. Continue Zoloft 100mg  po qd, Trazodone 50-100mg  po qd prn sleep, and will try different brand of low dose Vitamin D3 over-the-counter    Pt verbalizes understanding of and agreement with tx recommendation and plan - yes    NEXT STEPS    Recommended next steps: call as needed; follow up with me 6 months and prn.

## 2011-03-28 ENCOUNTER — Encounter: Payer: Self-pay | Admitting: Family Medicine

## 2011-03-28 ENCOUNTER — Ambulatory Visit (INDEPENDENT_AMBULATORY_CARE_PROVIDER_SITE_OTHER): Payer: Self-pay | Admitting: Family Medicine

## 2011-03-28 VITALS — BP 120/74 | HR 72 | Temp 98.5°F | Ht 65.0 in | Wt 156.8 lb

## 2011-03-28 DIAGNOSIS — N390 Urinary tract infection, site not specified: Secondary | ICD-10-CM

## 2011-03-28 LAB — POCT URINALYSIS DIPSTICK
Bilirubin, UA: NEGATIVE
Ketones, UA: NEGATIVE
Protein, UA: 30
Spec Grav, UA: 1
pH, UA: 8.5

## 2011-03-28 MED ORDER — CEPHALEXIN 500 MG PO CAPS: 500.0000 mg | ORAL_CAPSULE | Freq: Two times a day (BID) | ORAL | Status: AC

## 2011-03-28 NOTE — Progress Notes (Signed)
60 yo here for ? UTI.   H/o recurrent UTIs although multiple urine cultures in emr show insignificant or no growth.  Dysuria: yes duration of symptoms: 3 days abdominal pain: yes Fevers: no back pain: yes vomiting:no other concerns:no  PCN listed as allergy, per pt just upsets her stomach. Has taken Keflex in past.    Meds, vitals, and allergies reviewed.   ROS: See HPI.  Otherwise negative.    Physical exam: BP 120/74  Pulse 72  Temp(Src) 98.5 F (36.9 C) (Oral)  Ht 5\' 5"  (1.651 m)  Wt 156 lb 12.8 oz (71.124 kg)  BMI 26.09 kg/m2  GEN: nad, alert and oriented HEENT: mucous membranes moist NECK: supple CV: rrr.  PULM: ctab, no inc wob ABD: soft, +bs, suprapubic area tender EXT: no edema SKIN: no acute rash BACK: no CVA pain  A/P- UTI- UA pos. Treat with 7 day course of Keflex. Send urine for culture given h/o recurrent UTIs.

## 2011-03-28 NOTE — Progress Notes (Signed)
Addended by: Linde Gillis on: 03/28/2011 09:57 AM   Modules accepted: Orders

## 2011-03-30 ENCOUNTER — Telehealth: Payer: Self-pay | Admitting: *Deleted

## 2011-03-30 LAB — URINE CULTURE: Organism ID, Bacteria: NO GROWTH

## 2011-03-30 NOTE — Telephone Encounter (Signed)
Spoke with patient regarding urine culture results. 

## 2011-03-30 NOTE — Telephone Encounter (Signed)
Pt returned your call, please call her when you can, she will have her phone with her.

## 2011-03-30 NOTE — Telephone Encounter (Signed)
I think Angelica Mendoza called her with urine culture results.

## 2011-04-03 ENCOUNTER — Telehealth

## 2011-04-03 NOTE — Progress Notes (Addendum)
Ike Bene, [facility] 04/03/2011 10:12 AM Signed   Patient requesting physician order the following lab tests: diabetic , bum & geric acid ,plus vitamin d level   Patient may be reached at: [phone]   Patient advised that office/PCP has 24-48 business hours to return their call.

## 2011-04-03 NOTE — Telephone Encounter (Addendum)
Patient requesting physician order the following lab tests: diabetic , bum & geric acid ,plus vitamin d level       Patient may be reached at: [phone]    Patient advised that office/PCP has 24-48 business hours to return their call.

## 2011-04-03 NOTE — Telephone Encounter (Addendum)
See my note of 04/03/11, orders in.

## 2011-04-03 NOTE — Progress Notes (Addendum)
04/03/2011 11:57 AM received a verbal order for vit D, BUN, uric acid and diab panel from Dr. Pricilla Holm.  Order repeated back to Dr. Pricilla Holm and order confirmed.  Diagnosis to be associated withorder dm, diagnosis confirmed.  Member advised on labs ordered and agreeable to having done. pcp appt 04/17/11.

## 2011-04-17 NOTE — Patient Instructions (Addendum)
You are having a CT scan that requires you STOP taking your  DIABETIC MEDICATION METFORMIN AFTER you have your test. You MUST also have a LAB TEST 48 HOURS after your CT Scan.  Go to any [facility] and have this test done 48 hours after your CT scan. Please CONTACT YOUR DOCTOR AFTER YOU HAVE YOUR LAB TEST, (please call the [facility] at [phone] or Toll Free [phone].)  and your doctor will tell you when it is safe to resume taking the Metformin.

## 2011-04-17 NOTE — Progress Notes (Addendum)
In 1155      Cough, productive yellowish, sputum, drainage,     Review of Systems   Constitutional: Positive for malaise/fatigue and diaphoresis. Negative for chills.   Respiratory: Positive for cough and wheezing. Negative for hemoptysis.        Physical Exam   Nursing note and vitals reviewed.  Constitutional: She is well-developed, well-nourished, and in no distress.   Cardiovascular: Normal rate, regular rhythm and normal heart sounds.    Pulmonary/Chest: She is in respiratory distress. She has no wheezes. She has no rales. She exhibits no tenderness.           I explained to [employer] that she  will be having a Ct scan that requires she stop taking the diabetic medication metformin  after the scan.  I also explained I have ordered a lab test that needs to performed in 48 hours after the test, after reviewing the lab results, I will notify her when it is safe to resume taking the metformin.  ASSESSMENT/PLAN:    ACUTE BRONCHITIS  (primary encounter diagnosis)  Note: rule out COPD  Plan: REFERRAL PULMONARY FUNCTION TEST, XR CHEST 2         VIEWS, FRONTAL AND LATERAL, QVAR 80. AMOXICILLIN 500 MG TID #30         MCG/ACTUATION INHL AERO            DM  Note: borderline  HX OF TOBACCO USE  Note: remote  RHINITIS, CHRONIC  Note: nasal inhaler   Plan: FLUTICASONE  50 MCG/ACTUATION NASL SPSN            DM 2  Note:   Plan: CREATININE, PLASMA, CT THORAX, W CONTRAST              FOLLOW-UP: 1 mo  Out 1225

## 2011-04-18 ENCOUNTER — Encounter

## 2011-04-18 ENCOUNTER — Telehealth

## 2011-04-18 NOTE — Progress Notes (Addendum)
The patient, Eileen Barnes's, identity was verified by name and dob  Chief Complaint   Patient presents with   ??? DIABETES CARE MANAGEMENT     Checking chart after visit with pcp on 04/17/11.  SUBJECTIVE:   Has c/o sinus congestion.    OBJECTIVE:     Diabetes 10/25/2010 04/11/2011 04/11/2011 04/11/2011   HBA1c 8.1 (H)  7.8 (H)    Cholesterol  132 133    Triglyceride       LDL Direct 58 71 72    HDL  37 (L) 38 (L)    Creatinine 0.59  0.60    ALT       Potassium 4.4  4.3    Microalbumin/CR    <31.0 (H)       LAST DIABETIC RETINAL EXAM (Ordered Date): 04/20/10  BP Readings from Last 3 Encounters:   04/17/11 118/60   01/06/11 136/80   11/02/10 110/68     On  Asa yes  On ace yes  On statin yes  ASSESSMENT:  DM    PLAN:   Continue all medications as directed. To call with any concerns.

## 2011-04-19 ENCOUNTER — Encounter

## 2011-04-19 NOTE — Progress Notes (Addendum)
Patient had IV contrast (Omni 300) today.  All pertinent questions were asked before injection.  1. H/O allergies (iv contrast) ----  no      2. Diabetic                             ----yes       3. What type of diabetes med--insulin, metformin, glyburide         4. Dialysis                    ----no     Pt. Instructed to stop taking Metformin for 48 hrs. And to call M.D. For further instructions.

## 2011-04-25 ENCOUNTER — Encounter

## 2011-04-25 NOTE — Telephone Encounter (Addendum)
Patient requesting to discuss x ray and cat scan  results done on 6/6.  Please contact at:   Telephone Information:   Home Phone [phone]     .    Patient advisedthat office/PCP has 24-48 business hours to return their call.

## 2011-04-25 NOTE — Telephone Encounter (Addendum)
Results reviewed with member who has no further questions or concerns, per member she has not been contacted yet by Pulmonary Dept will contact @ the end of week if no call.

## 2011-04-26 ENCOUNTER — Telehealth

## 2011-04-26 NOTE — Telephone Encounter (Addendum)
Attempted to contact pharmacy, phone busy, will try again later. Kimberly Busciglio, CMA(AAMA), RMA(AMT)

## 2011-04-26 NOTE — Telephone Encounter (Addendum)
Mbr is asking for the below med to be re- written  For 3 month supply and called in. Thanks     Name of medication: ATENOLOL 50 MG ORAL TAB  - Route: TAKE 1 TABLET BY MOUTH TWICE DAILY FOR BLOOD PRESSURE - Oral    Pharmacy Name: [facility]     Pharmacy Address:  /    Pharmacy Phone Number: [phone]    Contact number for member:   Telephone Information:   Home Phone [phone]   Work Phone Not on file.         Patient instructed to contact the pharmacy prior to picking up the medication.    Patient advised that office/PCP has 24-48 business hours to return their call.

## 2011-04-26 NOTE — Telephone Encounter (Addendum)
Rx called into [facility] [phone] pharmacy. I spoke with voicemail in the pharmacy. PT notified Rx called in. Keith Rake, [facility]), RMA(AMT)

## 2011-04-26 NOTE — Telephone Encounter (Addendum)
Please call in RX refill - C. Dorenda Pfannenstiel CRNP

## 2011-05-09 NOTE — Progress Notes (Addendum)
 The patient Eileen Barnes was identified by name and MRN.  Chief Complaint   Patient presents with   . PULMONARY FUNCTION TEST     The following test complete pulmonary function test has been completed without complications. Results will be available in [facility].      Pt given aerosol tx with 0.083% albuterol  tolerated well  no adverse reaction.

## 2011-05-15 NOTE — Telephone Encounter (Addendum)
pulmonary function test results in ms view

## 2011-05-18 NOTE — Telephone Encounter (Addendum)
Abnormal PFT's consistent with mild to moderate obstructive airway disease.

## 2011-05-22 NOTE — Progress Notes (Addendum)
In 0920  Follow up on bronchitis and PFT's. Pt states she is feeling some better. Not coughing as much. Thinks it may be allergies. Has chronic sinus trouble.             Review of Systems   Constitutional: Negative.    Respiratory: Negative.    Cardiovascular: Negative.        Physical Exam   Nursing note and vitals reviewed.  Constitutional: She is well-developed, well-nourished, and in no distress.   Cardiovascular: Normal rate, regular rhythm and normal heart sounds.    Pulmonary/Chest: Effort normal and breath sounds normal.         Reviewed PFT's    ASSESSMENT/PLAN:    ASTHMA  (primary encounter diagnosis)  Note: aggravated by bronchitis.   Plan: REFERRAL PULMONARY            SEVERE OBESITY (BMI >= 40)  Note: considering bariatric surgery.   Plan: pulmonary     FOLLOW-UP: 6 mo and prn    Out 0945

## 2011-05-22 NOTE — Patient Instructions (Addendum)
Lung Function Tests: After Your Visit      Your [facility] Instructions    Lung function tests check how well your lungs work. They may also be calledpulmonary function tests, or PFTs.    Doctors use lung function tests to find the cause of breathing problems and diagnose lung diseases like asthma or emphysema. You may have lung function tests before you have surgery. Or your doctor may use lung function tests to find out how well treatment for a lung problem is working.    Spirometry is often the first lung function test that is done. It measures how much air your lungs hold and how quickly your lungs can move the air in and out. You may also have other tests, such as gas diffusion tests, body plethysmography, inhalation challenge tests, and exercise stress tests. Your doctor will explain which tests you need.    Follow-up care is a key part of your treatment and safety. Be sure to make and go to all appointments, and call your doctor if you are having problems. It???s also a good idea to know your test results and keep a list of the medicines you take.    How to prepare    Before your lung function tests    - Tell your doctor if you have any other medical problems, such as heart disease.  - Tell your doctor if you are allergic to any medicines.  - Let your doctor know if you take medicines for a lung problem. You may need to stop some of them before the tests.    On the day of the tests    - Wear loose clothing that does not restrict your breathing.  - If you have dentures, wear them during the test to help you form a tight seal around the spirometer???s mouthpiece.  - Do not eat a large meal just before the test. A full stomach may keep your lungs from fully expanding.  - For 6 hours before the test, do not smoke or exercise hard.    What happens during the lung function tests?    What happens during the test depends on the type of test you have. A respiratory therapist or technician will do the lung  function tests. The testing may take from 5 to 30 minutes, depending on how many tests you have.    For most tests, you will wear a nose clip. This is to make sure that no air passes in or out of your nose during the test. You then breathe into a mouthpiece attached to a recording device.    - For some tests, you breathe in and out as deeply and quickly as you can.  - You may repeat some tests after you inhale a medicine that expands your airways.  - You may breathe certain gases, such as 100% oxygen or a mixture of helium and air.  - For body plethysmography, you sit inside a small booth with windows. The booth measures pressure changes that occur as you breathe.    The therapist may urge you to breathe deeply during some of the tests to get the best results.    You may have a blood test to check oxygen and carbon dioxide in your blood before, during, or after your lung function tests.    When should you call for help?    Watch closely for changes in your health, and be sure to contact your doctor if you have any  problems.    Where can you learn more?    Go to http://www.SecuredTickets.se    Enter [phone] in the search box to learn more about "Lung Function Tests: After Your Visit".    This care instruction is for use with your licensed healthcare professional. If you have questions about a medical condition or this instruction, always ask your healthcare professional. Care instructions adapted by [facility] from [employer] ?? 2008. [employer] disclaims any warranty or liability for your use of this information.You may receive a survey aboutyour visit today in the mail. We want you to let us know if we are doing a good job and how we can improve our service to you. Please return the postage paid survey to help [place] meet our goal of providing you with excellent health care.

## 2011-05-25 NOTE — Patient Instructions (Addendum)
Do you understand your plan of care today? Is there anything else I can help you with today? You may be getting a survey in the mail regarding your care today. If you do please take a few minutes to fill it out and mail it back to us. It would be greatly appreciated.

## 2011-05-25 NOTE — Progress Notes (Addendum)
The patient, Eileen Barnes's, identity was verified by name    SUBJECTIVE:  60 year old,  Chief Complaint   Patient presents with   ??? CONSULTATION       "Eileen Barnes is being sent to you for consultation for asthma"  Referred by [name] (M.D.), MEDICAL DOCTOR  The patient has a history of obesity, DM, hypertension, depression    She denied childhood asthma or lung disease.  She smoked 1/2 ppd x 20 yrs quitting 20 yrs ago. She complains of having daily cough x 10 yrs. Cough can be triggered by Musk perfumes, smoke, some cleaning solutions.  At time she has severe coughing with mucus production, shortness of breath and is treated for "asthmatic bronchitis".  She has been on prn albuterol for at least 5 yrs. Albuterol does stop a coughing spell.    She has been on lisinopril for about 10 yrs. She also complains of gastro-esophageal reflux symptoms and use to use 1 bottle tums/month. Now has head of bed elevated, does not eat and lay down, avoids certain foods. Brett Albino will still give her reflux to her throat.  Recentlyhad "bronchitis" from April until July. Now back to baseline.    She reportedly had consistently "negative tuberculosis skin test"    Review of Systems - History obtained from the patient  General ROS: negative for - chills, fatigue, fever or weight loss  ENT ROS: positive for - nasal discharge and worse in spring  negative for - epistaxis or vocal changes  Allergy and Immunology ROS: positive for - seasonal allergies  negative for - pet ( owns dog and cat)  Respiratory ROS: positive for - cough, shortness of breath and walking 5 minutes on treadmill or < 20 minutes outside)  negative for - hemoptysis, pleuritic pain or wheezing  Cardiovascular ROS: positive for - edema  negative for - chest pain, palpitations or paroxysmal nocturnal dyspnea  Gastrointestinal ROS: positive for - heartburn  negative for - abdominal pain, appetite loss or swallowing difficulty/pain  Musculoskeletal ROS: negative  for - joint swelling  Neurological ROS: positive for - gait disturbance and stated has balance problems walking    Family History   Problem Relation Age of Onset   ??? Other [Other] [OTHER] Mother      5 Years of Alzheimer's Disease and Living  with HTN, Thyroid problems   ??? Diabetes Father      Deceased With Complications, Dialysis patient   ??? Hypothyroidism Paternal Aunt      Hx of Grave's Disease   ??? Other [Other] [OTHER] Paternal Aunt      Neuropathy unrelated to Diabetes   ??? Other [Other] [OTHER] Brother      Overweight with HX of DVT   ??? Breast Cancer Maternal Aunt    ??? Colon Cancer Maternal Aunt      x4   ??? Ovarian Cancer None    ??? Uterine Cancer None    ??? Diabetes Maternal Grandmother    ??? Asthma Son      History     Social History   ??? Marital Status: Unknown     Spouse Name: Married     Number of Children: N/A   ??? Years of Education: N/A     Occupational History   ??? 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 Smoking status: Former Smoker -- 0.5 packs/day for 20 years  Quit date: 11/13/1993   ??? Smokeless tobacco: Never Used   ??? Alcohol Use: No   ??? Drug Use: No   ??? Sexually Active: Not Currently -- Female partner(s)      ascus (=) HPV with cryo     Other Topics Concern   ??? Not on file     Social History Narrative   ??? No narrative on file         Meds:  ATENOLOL 50 MG ORAL TAB Yes   ALLOPURINOL 100 MG ORAL TAB Yes   FLUTICASONE 50 MCG/ACTUATION NASL SPSN Yes   QVAR 80 MCG/ACTUATION INHL AERO Yes   PROAIR HFA 90 MCG/ACTUATION INHL HFAA Yes   NOVOLIN N 100 UNIT/ML SUBQ SUSP Yes   GLYBURIDE 5 MG ORAL TAB Yes   LISINOPRIL 10 MG ORAL TAB Yes   METFORMIN 1000 MG ORAL TAB Yes   TRAZODONE 50 MG ORAL TAB Yes   ASPIRIN 81 MG ORAL TBEC DR TAB Yes   VITAMIN D-3 2000 UNIT ORAL TAB Yes   SERTRALINE 50 MG ORAL TAB Yes   SIMVASTATIN 40 MG ORAL TAB Yes    OBJECTIVE:  BP 124/80   Pulse 70   Temp 98.9 ??F (37.2 ??C)   Resp 18   Ht 5\' 7"  (1.702 m)   Wt 271 lb (122.925 kg)   BMI 42.44 kg/m2    SpO2 100% on RA  Wt Readings from Last 3 Encounters:   05/25/11 271 lb (122.925 kg)   05/22/11 273 lb 3.2 oz (123.923 kg)   04/17/11 268 lb (121.564 kg)   no apparent distress  Obese  Nose; clear mucus anterior naris bilateral  Throat; no exudate, inflammation  Neck: no palpable thyromegaly, or lymph node  Chest is clear, no wheezing or rales. Normal symmetric air entry throughout both lung fields. No chest wall deformities or tenderness.  S1 and S2 normal, no murmurs, clicks, gallops or rubs. Regular rate and rhythm.   Extremities: no pedal edema, no clubbing or cyanosis.      ACT score 20    chest X-ray 04/19/11 ( I reviewed films, right diaphragm elevated, calcified right lower lobe granuloma)  "IMPRESSION:   1. Chronic pulmonary markings without radiographic evidence of acute   cardiopulmonary disease.   2. Old granulomatous disease.   3. No significant interval change."    CT-scan of the chest 04/19/11 ( I reviewed scan, slight right diaphragm elevation, calcified right hila ln, and right lower lobe posterior granuloma)  "The pulmonary parenchyma is clear of consolidating infiltrates. The right   lower lobe shows a small calcified granuloma. There is no mediastinal, hilar,   axillary lymphadenopathy. No evidence of chest wall mass or pleural effusion   is present. "    DATE: 05/09/11  PFT: FEV1: 2.42/91%  FVC: 3.28/99%  FEV1/FVC: 74%  FEF 25-75%: 1.78/63%  TLC :  7.34/134%  DLCO:  18.3/64%  DLCO/VA:  4.87/130%      ASSESSMENT/PLAN:    ASTHMA  (primary encounter diagnosis)  Note: reported bouts of "asthmatic bronchitis". Recently started on Qvar. Will get below testing.  Plan: IGE, COMMON RAGWEED, IGE, DOG DANDER, IGE,         [place] BLUE GRASS, IGE, MEADOW FESCUE, IGE,         TIMOTHY GRASS, TREE ALLERGEN PANEL (13)., IGE,         PENICILLIUM NOTATUM, IGE, CAT DANDER, IGE,         CLADOSPORIUM HERBARUM, IGE, ASPERGILLUS  FUMIGATUS, IGE, ALTERNARIA ALTERNATA, DUST         ALLERGEN PANEL (COCKROACH, HOUSE  DUST, MITE).            CHRONIC COUGH  Note: Reviewed diff dx of cough, including upper airway cough syndrome (UACS), gastro-esophageal reflux symptoms, and cough variant asthma as major causes. She has reflux, reported asthma, post nasal drip at times and is on ACE inhibitor.  Reviewed treatment plan.   Plan: OMEPRAZOLE 20 MG ORAL CPDR SR CAP        Will send note to [name] (M.D.), MEDICAL DOCTOR about changing ACE inhibitor    GERD  Note: will rx.  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 [place]. Will check histo titers  Plan: HISTOPLASMA YEAST AND MYCELIAL ANTIBODIES              Will communicate the consult note back to referring provider or primary provider via the  shared electronic medical record.    FOLLOW-UP: Phone in 6 weeks

## 2011-05-31 NOTE — Patient Instructions (Addendum)
Please call the eye clinic at [phone] if new symptoms appear or eye concerns present.      At your next visit please plan to be here for 1 to 2 hours. Your visit may include a dilated eye exam and additional testing.    Please bring your glasses with you to future eye appointments.    Please bring eye medications you are taking, including any over the counter products.    You may be receiving a survey in the mail regarding your satisfaction with this ophthalmology visit. We in the Ophthalmology Department would like to request that you complete this questionnaire because your feedback is valuable and will allow [place] to continue providing the highest quality care.    If you would like information regarding Advanced Directives, please contact a [employer] surveyor at one of the following numbers:    [employer] [phone]    [employer] [phone]

## 2011-06-02 ENCOUNTER — Telehealth

## 2011-06-02 NOTE — Telephone Encounter (Addendum)
PFT. Has already seen pulmonary. From review was to receive note re: changing from ACEI re: cough. Will change to Losartan. Notify pt.

## 2011-06-02 NOTE — Telephone Encounter (Addendum)
mbr notified.  Patient verbalizes understanding and has no additional questions.

## 2011-06-21 NOTE — Progress Notes (Addendum)
The patient, Eileen Barnes, identity was verified by name and MRN.  05/31/2011  60 year old  Chief Complaint   Patient presents with   ??? DIABETIC RETINOPATHY SCREENING     feels vision is a little foggy and vision is not as crisp     HGBA1C   Date Value Range Status   04/11/2011 7.8* 3.4 to 5.9 (%) Final      (NOTE)      Adults:       A1c Value   Interpretation       < 6%    Non-diabetic       6.0-6.4%   Increased risk for DM       > or = 6.5%   Consistent with DM      Peds (<18y/o)       < 5.7%    Non-diabetic       5.7-6.4%   Increased risk for DM       > or = 6.5%   Consistent with DM      Analysis Performed by [facility] Reference laboratory: [id number] [place]., [address]   Pt using insulin bid. Pt says lost 20 lbs over past year.        Medication given and are available in [facility].  Pt offered chaperone and refused.  Reviewed technician notes and accepted.  Allergies and Medications reviewed.  POH Pt denies previous eye surgery, injury or laser treatment.  Chalazion od  LASIK 2000 OU   Cataracts ou  FOH Pt denies any known familial ocular disease.  DM X 10 years  NEM      PUPILS:   Dilated ou       SLE:    Lids/lashes: OU no lesions, + debris in tear film                 Conj/sclera: OU no injection                Cornea: OU clear, no stains             A/C:    OU deep and quiet              Iris:    OU no RI             Lens:  OD nuclear trace to 1-2+          OS nuclear trace to 1-2+    Fundus photographs taken OU    The patient's  examination today included the use of dilating drops.The patient was informed of the potential, time-limited detrimental effects of these medications on the vision and the possible inability of the patient to safely drive.     IMP:   1. 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           Discussed need for BS control less than 140 and need for annual eye exam.    Patient aware of cataracts ou, pt happy with vision.    AVS distributed and discussed. Patient verbalizes understanding and has no additional questions.    Va, ta, fluress 1 gtt ou and dilate Mydriacyl 1% 2 gtts OU next visit

## 2011-06-21 NOTE — Progress Notes (Addendum)
The identity of patient, Eileen Barnes, has been verified by name and MRN  60 year old female  Chief Complaint   Patient presents with   . DIABETIC RETINOPATHY SCREENING     feels vision is a little foggy and vision is not as crisp      POH: LASIK ou 2000, cataract ou, dry eye  Pmhx: +DM since 2000  Fhx: no coag  HGBA1C   Date Value Range Status   04/11/2011 7.8* 3.4 to 5.9 (%) Final      (NOTE)      Adults:       A1c Value   Interpretation       < 6%    Non-diabetic       6.0-6.4%   Increased risk for DM       > or = 6.5%   Consistent with DM      Peds (<18y/o)       < 5.7%    Non-diabetic       5.7-6.4%   Increased risk for DM       > or = 6.5%   Consistent with DM      Analysis Performed by [facility] Reference laboratory: [id number] [place]., [address]   Eye Meds:  None     Eye meds given: 1 gtt flurate x1 ou, 1 gtt mydriacyl 1% x1 ou,  (see scanned med order in webview)    a/c appears open ou  pt offered sunglasses  pt education: n/a

## 2011-06-21 NOTE — Progress Notes (Addendum)
Fundus photos reviewed.  No diabetic retinopathy noted.  Recommend repeat photos in 18 months.

## 2011-07-04 MED ORDER — BLOOD GLUCOSE TEST VI STRP
Status: DC
Start: 2011-07-04 — End: 2014-09-16

## 2011-07-06 ENCOUNTER — Telehealth

## 2011-07-06 NOTE — Progress Notes (Addendum)
Spoke to her. She switched to losartan 06/05/11 and her cough is 95%-100% better. She has not been taking her inhalers and is not having any shortness of breath.  She is taking omeprazole and the rawness in chest resolved.    ASSESSMENT/PLAN:    CHRONIC COUGH  (primary encounter diagnosis)  Note: due to ACE inhibitor  GERD  Note: improved on omeprazole. Continue rx    ACE INHIBITOR SENSITIVITY  Note: stay off lisinopril    FOLLOW-UP: with [name] (M.D.), MEDICAL DOCTOR

## 2011-08-16 NOTE — Patient Instructions (Addendum)
Do you understand your plan of care today? Is there anything else I can help you with today? You may be getting a survey in the mail regarding your care today. If you do please take a few minutes to fill it out and mail it back to us. It would be greatly appreciated.      Immediately after your treatment with Liquid Nitrogen,the area will burn for about 20 minutes.   They will redden and a blister may form within 24 hours. Do not open the blister.   Later the area becomes crusted and dry apply vasline twice daily . No dressing is necessary. Wash in your usual fashion.. Occasionally an area needs to be treated again if lesion is not completely healed.      RECALLPLACED FOR 6 MONTHS

## 2011-08-16 NOTE — Progress Notes (Addendum)
60 year old patient 6 month follow up for hx BCC and AK's (pt hard of hearing - reads lips)  Has some rough spots on legs, forearms - brown spots on forearms  Also uses Vaniqa - helpful - previous laser per Dr. Rudell Cobb  Thinking about laser treatment of toenails - painted today, no thick subungual hyperkeratosis visible      ROS: pt feels well; No other skin complaints or changing lesions.   Soc: married; husband issues with $100 Viagra    OBJECTIVE: Pleasant well-appearing female.   BP 137/65   Pulse 72   Temp(Src) 97.7 ??F (36.5 ??C) (Oral)   Resp 16  General skin exam of scalp, face, ears, neck, chest, abdomen, back, bilateral upper and lower extremities, hands and feet shows:   - well-healed surgical scar of posterior lateral shoulder  - thin scattered 2-3 mm AK's of bilateral extensor forearms x 11, lentigos of R > L dorsal hand  - toenails appear thin but all painted today - no tinea pedis     ASSESSMENT/PLAN:    HX OF BASAL CELL CA OF SKIN  (primary encounter diagnosis)  Note: L leg, R upper posterior shoulder (Drs. Carroll Kinds, Rudell Cobb)    ACTINIC KERATOSIS  Note: 11 total lesions   Plan: DESTRUCTION ACTINIC KERATOSIS , FIRST LESION,         DESTRUCTION ACTINIC KERATOSIS , 2 THROUGH 14         LESIONS, EACH          Liquid nitrogen destruction x 6-10 seconds to noted lesions, wound care discussed     HIRSUTISM  Note: jaw line, pt otherwise has to shave daily  Plan: VANIQA 13.9 % TOP CREAM - bid            FOLLOW-UP: 6 months or sooner as needed for changing lesions

## 2011-09-15 ENCOUNTER — Ambulatory Visit

## 2011-09-15 ENCOUNTER — Encounter

## 2011-09-15 NOTE — Progress Notes (Addendum)
FOLLOW UP    Visit Time: 30 minutes    The patient, [employer], identity was verified by name and MRN    Those attending session: patient    SYMPTOM SCREENS: Duration: since last visit continues report mood "very goody"; coping well; anger in control; Denies vegetative symptoms; sleep good; continues Zoloft 100mg  po qd, Trazodone 50mg  po qhs, Denies side effects    Session content: She and family pleased with continued progress; pleased at more active; has been traveling with husband; major stress is 2 year old son Madelaine Bhat living with them who is alcoholic "admits he is but does not want to stop.Marland KitchenMarland KitchenI know I'm enabling him"; agreed he will move out in June when he graduates from on-line college; says he may go on for graduate degree; he works regularly; has his 39 year old daughter 50% of the time; has another 67 year old daughter "he keeps at arms length, it's a bone of contention"; recent blow-up between Madelaine Bhat and other children regarding his drinking.  Returned to church which has been positive.    MENTAL STATUS    Mental Status:appearance: appropriately dressed, appropriately groomed, good hygiene, light make-up, hair styled, affect: congruent with mood and appropriate, attitude: Cooperative, mood: calm, bright, easily verbal, cooperative, personable, speech: appropriate, thought content: no evidence of psychosis, thought process; logical, coherent, orientation: oriented in all spheres, memory: recent:  good and remote:  good, insight: fair, judgment: good, cognitive: intact and motor behavior: normal    RISK ASSESSMENT    Suicide screen: denies current suicidal ideation, plan and intent    Homicide screen: denies homicidal ideation, plan and intent    CLINICAL ASSESSMENT    Clinical assessment: Mood symptoms continue improved and coping well overall with treatment and self intervention.    DIAGNOSIS:    AXIS I:   Diagnosis   1. 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 Client verbalizes understanding and agrees to above plan.    GOALS, ASSIGNMENTS    Interventions in session: Gave positive feedback for efforts and progress. Reviewed and reinforcedself care strategies and resources including physiological factors that impact mood symptoms and stress discussed and reinforced. Reinforced boundaries of control and responsibility and her role in meeting her needs. Reinforced focus an areas within his/her control - self - versus hoping for change externally to meet his/her needs. Discussed group treatment options. Educated about medications including options, risks, and benefits.    Goals: Continue to more effectively cope with mood symptoms and stress.    Assignments: As above. Continue Zoloft 100mg  po qd, Trazodone 50-100mg  po qd prn sleep    Pt verbalizes understanding of and agreement with tx recommendation and plan - yes    NEXT STEPS    Recommended next steps: call as needed; follow up with me 6 months and prn; Alanon.

## 2011-10-09 NOTE — Patient Instructions (Addendum)
Benign Paroxysmal Positional Vertigo (BPPV): After Your Visit      Your [facility] Care Instructions  paroxysmal positional vertigo, also called BPPV, is an inner ear problem that causes a spinning or whirling sensation when you move your head. This sensation is called vertigo. The vertigo usually lasts for less than a minute. People often have vertigo spells for a few weeks, and then the vertigo goes away. But it may come back again. The vertigo may be mild, or it may be bad enough to cause nausea.    Tiny calcium "stones"inside your inner ear canals help you keep your balance. Normally, when you move a certain way--such as when you stand up or turn your head--these stones move around. But things like infection or inflammation can stop the stones from moving like they should. This sends a false message to your brain and causes vertigo.    Some medicines that are given to decrease vertigo--such as diazepam (Valium), meclizine, and scopolamine--may slow the brain's ability to adjust to and overcome BPPV. They should be avoided if possible. Certain exercises, called Brandt-Daroff exercises, can help decrease vertigo.    Follow-up care is a key part of your treatment and safety. Be sure to make and go to all appointments, and call your doctor if you are having problems. It???s also a good idea to know your test results and keep a list of the medicines you take.    How can you care for yourself at home?    To do Brandt-Daroff exercises:    - Sit on the edge of a bed or sofa and quickly lie down on the side that causes the worst vertigo. Lie on your side with your ear down.  - Stay in this position for at least 30 seconds or until the vertigo goes away.  - Sit up. If this causes vertigo, wait for it to stop.  - Repeat the procedure on the other side.  - Repeat this 10 times. Do these exercises 2 times a day until the vertigo is gone.    When should you call for help?    Call 911 anytime you think you may need  emergency care. For example, call if:    - You have signs of a stroke. These may include:    - Sudden numbness, paralysis, or weakness in your face, arm, or leg, especially on only one side of your body.    - New problems withwalking or balance.    - Sudden vision changes.    - Drooling or slurred speech.    - New problems speaking or understanding simple statements, or feeling confused.    - A sudden, severe headache that is different from past headaches.    Call your doctor now or seek immediate medical care if:    - You have new or worse nausea and vomiting.  - You have hearing changes, such as hearing loss or ringing or roaring in your ears.    Watch closely for changes in your health, and be sure to contact your doctor if:    - You have problems doing the exercises.  - Your vertigo has not improved after using these exercises for a few weeks.  - Your vertigo gets worse.  - You develop new numbness, weakness, or loss of vision.    Where can you learn more?    Go to http://www.[facility].org    Enter P372 in the search box to learn more about "Benign Paroxysmal Positional Vertigo (  BPPV): After Your Visit".    This care instruction is for use with your licensed healthcare professional. If you have questions about a medical condition or this instruction, always ask your healthcare professional. Care instructions adapted by [facility] from [employer] ?? 2008. [employer]wise disclaims any warranty or liability for your use of this information.    Thank you for allowing us to provide your care today. We hope all your concerns were met at this visit.    You may receive a brief survey in the mail within the next few weeks concerning this office visit. Your feedback is an opportunity to comment on the service experience with your care team.  Thank you for taking your time to complete and return the survey.  Your feedback is valuable to us.    Valentina Wick, LPN

## 2011-10-10 NOTE — Progress Notes (Addendum)
In 1020  "intermittent dizziness for the last 12 days"  Vertigo onset 12 days. Worse in am. Walls moving in bathroom. Maylast hours. Later in day able to function      Review of Systems   Constitutional: Negative.    Eyes: Negative.    Respiratory: Negative.    Cardiovascular: Negative.    Neurological: Positive for dizziness.   All other systems reviewed and are negative.        Physical Exam   Nursing note and vitals reviewed.  Constitutional: She is oriented to person, place, and timeand well-developed, well-nourished, and in no distress.   Cardiovascular: Normal rate, regular rhythm and normal heart sounds.    Pulmonary/Chest: Effort normal and breath sounds normal.   Neurological: She is alert and oriented to person, place, and time. She has normal motor skills and intact cranial nerves. Gait normal.        Nystagmus when lying supine         ASSESSMENT/PLAN:    BILAT BENIGN PAROXYSMAL POSITIONAL VERTIGO  (primary encounter diagnosis)  Note: discussed therapy and ongoing management. Verbal and written instructions given    FOLLOW-UP: prn

## 2011-10-17 NOTE — Progress Notes (Signed)
Addended by: Adrienne Mocha. on: 10/17/2011 06:20 PM      Modules accepted: Orders

## 2011-10-23 ENCOUNTER — Telehealth

## 2011-10-23 ENCOUNTER — Encounter

## 2011-10-23 MED ORDER — INSULIN SYRINGE-NEEDLE U-100 31G X 5/16" 0.5 ML MISC
Status: DC
Start: 2011-10-23 — End: 2014-09-16

## 2011-10-23 NOTE — Telephone Encounter (Addendum)
Medications phoned into pharmacy. Patient aware.

## 2011-10-23 NOTE — Telephone Encounter (Addendum)
Name of medication:    METFORMIN 1000 MG ORAL TAB   Qty:120   Ref:3  ZOX:WRUE 1 TABLET BY MOUTH TWICE DAILY FOR DIABETES     [facility]: [facility]    Pharmacy Address:  [address]    Pharmacy Phone Number: [phone]    Contact number for member:   Telephone Information:   Home Phone [phone]   Work Phone Not on file.     Patient instructed to contact the pharmacy prior to picking up the medication.    Patient advised that office/PCP has 24-48 business hours to return their call.

## 2011-10-23 NOTE — Telephone Encounter (Addendum)
Metformin reordered. Please call in Rx - C. Rickesha Veracruz CRNP

## 2011-11-01 ENCOUNTER — Encounter

## 2011-11-15 ENCOUNTER — Encounter

## 2011-11-28 NOTE — Telephone Encounter (Addendum)
Eileen Barnes a 61 year old with chief complaint of hx diabetes, sts top of rt ft is swollen, painful, intermittently red, x 6-7 days, denies injury. Is concerned as has hx diabetes and sts FBS 130-140, today, 11/28/11, 169. Adv/prt. (sts has seen Dr Antonietta Barcelona in past, 01/11/09), appt scheduled, agrees, Thank you.     Triaged and Assessed:Foot Problems-P Protocol    N Skin breakage    N Drainage/discharge    N Fever, night sweats, chills    N Severe pain    N Red and  warm    Y Swollen     Schedule SDA (appt within 2-24h) in medicine or Podiatry or follow UC process.     Triage Plan:  Demographics verified, Instructed to call back if symptoms persist/change/worsen and Verbalized understanding/willingness to follow instructions

## 2011-11-29 ENCOUNTER — Telehealth

## 2011-11-29 ENCOUNTER — Encounter

## 2011-11-29 NOTE — Telephone Encounter (Addendum)
[facility] faxed a request for a refill for  FUROSEMIDE 20 MG ORAL TAB 100 2/2 02/15/2011 02/15/2012 -- Sig - Route: TAKE 1 TO 2 TABLETS BY MOUTH ONE TIME DAILY FOR FLUID RETENTION AS NEEDED - Oral Class: Phone In  Please call in to [facility] at [phone]  Thank you

## 2011-11-29 NOTE — Progress Notes (Addendum)
Podiatric Office Visit:  The patient, Eileen Barnes, identity was verified by name and MRN.  Chief Complaint   Patient presents with   ??? DIABETIC FOOT EXAM   ??? FOOT PAIN       c/o reddness/pain /swelling to top of right foot    ??? PLANTAR WARTS        c/o ? wart to bottom of right foot     ??? REFERRAL FOR ORTHOTICS        dm shoes     SUBJECTIVE:  Chief Complaint: Eileen Barnes  is a 61 year old DM female with peripheral neuropathy  who complains of right foot pain and swelling    Nature: Aching and swelling despite neuropathy  location: right midfoot  duration: 10 days  onset: suddent but unknown if any injury  course: stable, not improving  aggravating/alleviating factors: worse with pressure, walking  prior treatment: none    [name] (M.D.), MEDICAL DOCTOR:  Referring Physician:    PMH:  Past Medical History   Diagnosis Date   ??? SEVERE OBESITY (BMI >= 40) 07/09/2004     MORBID OBESITY   ??? MENOPAUSE 08/16/2004     FEMALE CLIMACTERIC STATE   ??? ACTINIC KERATOSIS. 02/12/2003     ACTINIC KERATOSIS   ??? DM 2, UNCONTROLLED, W DIABETIC POLYNEUROPATHY.    ??? ESSENTIAL HTN    ??? HYPERLIPIDEMIA    ??? GOUT         MEDS:  Current Outpatient Prescriptions   Medication Sig Dispense Refill   ??? GLYBURIDE 5 MG ORAL TAB TAKE 1 TABLET BY MOUTH EVERY MORNING AND TAKE 3 TABLETS EVERY EVENING FOR DIABETES  360  2   ??? METFORMIN 1000 MG ORAL TAB TAKE 1 TABLET BY MOUTH TWICE DAILY FOR DIABETES  120  3   ??? BD INSULIN SYRINGE ULT-FINE II 1/2 ML 31 X 5/16" MISC SYRINGE USE AS DIRECTED FOR DIABETES  100  5   ??? SIMVASTATIN 40 MG ORAL TAB TAKE 1 TABLET BY MOUTH EVERY NIGHT AT BEDTIME  60  5   ??? MECLIZINE 25 MG ORAL TAB 1/2 to 1 tablet three times daily as needed for vertigo  90  0   ??? VANIQA 13.9 % TOP CREA APPLY TO AFFECTED AREAS TWICE DAILY  30  6   ??? ONE TOUCH ULTRA TEST MISC STRIPS USE AS DIRECTED BY PHYSICIAN FOR DIABETES  200  6   ??? ONE TOUCH DELICA LANCETS MISC MISC USE AS DIRECTED BY PHYSICIAN  200  5   ??? SERTRALINE 100 MG ORAL TAB  TAKE 1 TABLET BY MOUTH ONE TIME DAILY  60  3   ??? LOSARTAN  25 MG ORAL TAB TAKE 1 TABLET BY MOUTH ONE TIME DAILY  93  3   ??? OMEPRAZOLE 20 MG ORAL CPDR SR CAP TAKE 1 CAPSULE BY MOUTH TWICE DAILY FOR STOMACH  120  2   ??? ATENOLOL  50 MG ORAL TAB TAKE 1 TABLET BY MOUTH TWICE DAILY FOR BLOOD PRESSURE  180  3   ??? ALLOPURINOL 100 MG ORAL TAB TAKE 2 TABLETS BY MOUTH ONE TIME DAILY TO PREVENT GOUT  186  3   ??? FLUTICASONE  50 MCG/ACTUATION NASL SPSN USE 2 SPRAYS IN EACH NOSTRIL ONE TIME DAILY  32  5   ??? QVAR 80 MCG/ACTUATION INHL AERO USE 2 PUFFS BY MOUTH TWICE DAILY FOR ASTHMA PREVENTION AND CONTROL  26.1  3   ??? PROAIR HFA  90 MCG/ACTUATION INHL HFAA USE 2 PUFFS BY MOUTH EVERY 4 TO 6 HOURS AS NEEDED FOR WHEEZING AND COUGH  8.5  2   ??? NOVOLIN N 100 UNIT/ML SUBQ SUSP INJECT 22 UNITS SUBCUTANEOUSLY EVERY MORNING AND 35 UNITS EVERY EVENING AND MAY TREAT TO TARGET WITH A BLOOD SUGAR GOAL OF 90 TO 130 MG/DL PRIOR TO MEALS FOR DIABETES  20  5   ??? FUROSEMIDE 20 MG ORAL TAB TAKE 1 TO 2 TABLETS BY MOUTH ONE TIME DAILY FOR FLUID RETENTION AS NEEDED  100  2   ??? ASPIRIN   81 MG ORAL TBEC DR TAB 1 TAB PO DAILY  100  0   ??? VITAMIN D-3 2000 UNIT ORAL TAB Take 1-1-1/2 tabs po qd  45  3       ALLERGIES:  Allergies   Allergen Reactions   ??? Codeine And Opiate Derivatives Nausea and/or Vomiting   ??? Kiwi Fruit      Pt. States throat closes up    ??? Lisinopril      Cough     ??? Pamelor (Nortriptyline)      Severe constipation   ??? Talwin (Pentazocine Lactate) Nausea and/or Vomiting   ??? Tramadol Hcl Nausea and/or Vomiting   ??? Vicodin (Acetaminophen-Hydrocodone)      Vomiting         Past surgical history:  Past Surgical History   Procedure Date   ??? Lasik       Family history:  Family History   Problem Relation Age of Onset   ??? Other [Other] [OTHER] Mother      84 Years of Alzheimer's Disease and Living  with HTN, Thyroid problems   ??? Diabetes Father      Deceased With Complications, Dialysis patient   ??? Hypothyroidism Paternal Aunt      Hx of Grave's  Disease   ??? Other [Other] [OTHER] Paternal Aunt      Neuropathy unrelated to Diabetes   ??? Other [Other] [OTHER] Brother      Overweight with HX of DVT   ??? Breast Cancer Maternal Aunt    ??? Colon Cancer Maternal Aunt      x4   ??? Ovarian Cancer None    ??? Uterine Cancer None    ??? Diabetes Maternal Grandmother    ??? Asthma Son      Social history:  History     Social History   ??? Marital Status: Unknown     Spouse Name: Married     Number of Children: N/A   ??? Years of Education: N/A     Occupational History   ??? 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 Smoking status: Former Smoker -- 0.5 packs/day for 20 years     Quit date: 11/13/1993   ??? Smokeless tobacco: Never Used   ??? Alcohol Use: No   ??? Drug Use: No   ??? Sexually Active: Not Currently -- Female partner(s)      ascus (=) HPV with cryo     Other Topics Concern   ??? Not on file     Social History Narrative   ??? No narrative on file         Review of Systems -  Patient denies fever, chills, nausea, vomiting, diarrhea, calf pain, shortness of breath and chest pain.    Physical Exam: Patient is alert and oriented times three.     Vascular:  palpable DorsalisPedis and Posterior Tibial Pulses B/L  Capillary Fill time < 3 seconds to digits 1-5 B/L  skin temperature warm to warm tibial tuberosity to the digits B/L  + edema dorsal right midfoot  NO cellulitis/lymphangitis present, no Homans or calf pain    Neurological:   diminished light touch/epicritic sensation B/L  absetn vibratory and sharp/dull sensations  absent protective sensation toes b/l.    Dermatological:   Skin appears well hydrated and supple. good color, texture, turgor. No open lesions present. No callosities present. Webspaces clean and dry 1-4 b/l. Nails 1-5 b/l appear normal. Small lesion central arch with divergent skin lines and pinpoint bleeding on debridement.    Musculoskeletal/Orthopaedic:   Structurally within normal limits  Toes 2-3 right are mildly  divergent  Pain maximally over the 3rd metatarsal dorsall right  Increased warmth right foot compared to left  +5/5 muscle strength Dorsiflexion, Plantarflexion, Inversion, Eversion B/L  ROM of the 1st MTPJ is full b/l.  ROM of the MTJ/STJ is full without pain or crepitus b/l.  Ankle joint ROM is full  B/L    Radiographs:  Bone stock WNL.  Cortical thickening of the 3rd metatarsal right foot with callus formation proximally, no fracture lines visible, no charcot changes    ASSESSMENT  Diagnosis   1. STRESS FX OF METATARSAL (733.94)    2. FOOT PAIN (729.5)    3. DM 2 W DIABETIC PERIPHERAL NEUROPATHY (250.60, 357.2)    4. PLANTAR WART (078.12)        PLAN:  Orders Placed This Encounter   ??? CRYOTHERAPY, FLAT WARTS, UP TO 14   ??? CLOSED TREATMENT, FX OF METATARSAL, WO MANIP, EACH BONE   ??? XR FOOT, RIGHT WEIGHT BEARING 3 OR MORE    ??? REFERRAL DME EQUIPMENT OTHER.   ??? BOOT WALKING PNEUMATIC       Tx//    Office Visit - with a thorough review of the Patient's Medical History and Podiatric Physical Exam completed.  Etiology and treatment options for were discussed with the patient.  Limit walking, use CAM boot at all times when WB, remove to shower, sleep, rest, ankle ROM exercises  Ice and compress the right leg  Rx: CAM boot, DVT risk discussed, see pt instructions  Canthacur to wart right foot wart, application #1  See instructions for additional patient instructions  Rx: DM shoes and inserts    Earlie Raveling DPM PGY-3    Ethelene Browns A. Giara Mcgaughey, DPM

## 2011-11-29 NOTE — Patient Instructions (Addendum)
A TYPE OF ACID HAS BEEN APPLIED TO THE PLANTAR WARTS.    KEEP COVERED, CLEAN AND DRY FOR 12 HOURS.    BATH AND SHOWER THE FOLLOWING DAY  OF APPLICATION AS NORMAL.    THE AREA IS EXPECTED TO BURN AND BLISTER IN 1-3 DAYS.    IF PAINFUL SOAK IN PLAIN COOL WATER, DRAIN THE BLISTER WITH STERILIZED, CLEAN NEEDLE, AND APPLY GAUZE PAD OR BANDAID TO AREA. REPEAT 2-3X/DAY AS NEEDED.    KEEP FOLLOW UP APPT TO HAVE WARTS EVALUATED FOR ADDITIONAL TREATMENT.      F/u 2 weeks      RT foot : 3rd metatarsal stress fracture  pneumatic cast boot/cam walker from [employer]  Ace wrap under the cast boot to control swelling  Elevation rt leg  Minimize walking on the cast boot  Remove to sleep, shower, or drive.  Patient was educated on mechanical and chemical DVT/PE prophylaxis. Patient again advised of signs and symptoms of DVT, PE. Advised to go directly to ED should these symptoms occur. Patient acknowledges understanding of above information.  Will repeat xrays in 4 weeks    Call or return to clinic prn if these symptoms worsen or fail to improve as anticipated.    Rx Diabetic orthotics and shoes from [facility] order placed

## 2011-11-29 NOTE — Progress Notes (Signed)
Addended byRosanne Gutting on: 11/29/2011 10:12 AM      Modules accepted: Orders

## 2011-11-29 NOTE — Progress Notes (Addendum)
Patient returns from ortho cast tech. Cast/splint/cam walker is noted to fit well. CFT is brisk and toes are warm. No pressure points or areas of irritation appreciated.    Patient was educated on mechanical and chemical DVT/PE prophylaxis. Patient again advised of signs and symptoms of DVT, PE. Advised to go directly to ED should these symptoms occur.    Patient acknowledges understanding of above information.

## 2011-11-29 NOTE — Progress Notes (Addendum)
The patient, Eileen Barnes, identification was verified by name and MRN.  A right cam walker boot was applied to the lower extremity as ordered by Dr. Antonietta Barcelona.  The patient was sent back to see the ordering provider for review and instructions.

## 2011-11-29 NOTE — Progress Notes (Addendum)
Rt 3rd met fx  The resident/student was under my direct supervision during today's patient encounter. I am in agreement with the medical chart documentation, as this is a reflection of my personal examination, assessment and treatment plan.    Sharren Bridge Claryssa Sandner, DPM

## 2011-12-05 MED ORDER — FUROSEMIDE 20 MG PO TABS
20 MG | ORAL | Status: DC
Start: 2011-12-05 — End: 2014-02-12

## 2011-12-05 NOTE — Telephone Encounter (Addendum)
Prescription call to [facility] pharmacy. Member notified.

## 2011-12-05 NOTE — Telephone Encounter (Addendum)
Med refill placed, may phone Rx in to [facility] - C. Latisia Hilaire CRNP

## 2011-12-06 ENCOUNTER — Telehealth

## 2011-12-06 NOTE — Telephone Encounter (Addendum)
Mbr has an appointment scheduled for 12/11/11 and would like to have labwork ordered for 6 month f/u. Please review and contact mbr once order is placed mbr can be reached at [phone]

## 2011-12-07 NOTE — Telephone Encounter (Addendum)
Called mbr advised of message. Patient verbalizes understanding and has no additional questions.

## 2011-12-07 NOTE — Telephone Encounter (Addendum)
Lab ordered

## 2011-12-08 LAB — TSH WITH REFLEX TO FT4: TSH: 3.51 m[IU]/mL (ref 0.465–4.68)

## 2011-12-11 NOTE — Patient Instructions (Addendum)
Thank you for allowing [place] to provide your care today. We hope all your concerns were met at this visit.    You may receive a brief survey in the mail within the next few weeks concerning this office visit. Your feedback is an opportunity to comment on the service experience with your care team.  Thank you for taking your time to complete and return the survey.  Your feedback is valuable to [place].    Jama Flavors, CMA

## 2011-12-12 NOTE — Assessment & Plan Note (Addendum)
ASSESSMENT/PLAN:    DM 2, CONTROLLED  (primary encounter diagnosis)  Note: uncontrolled  SEVERE OBESITY (BMI >= 40)  Note:   Plan: REFERRAL SURG, BARIATRIC            CLOSED FX OF FOOT  Note: under care of podiatry    FOLLOW-UP: 2-3 mo

## 2011-12-12 NOTE — Progress Notes (Addendum)
Subjective:   Eileen Barnes is a 61 year old female with hyperlipidemia.  Current Outpatient Prescriptions   Medication Sig Dispense Refill   ??? FUROSEMIDE 20 MG ORAL TAB TAKE 1 TO 2 TABLETS BY MOUTH ONCE A  DAY PRN FOR FLUID RETENTION  100  2   ??? GLYBURIDE 5 MG ORAL TAB TAKE 1 TABLET BY MOUTH EVERY MORNING AND TAKE 3 TABLETS EVERY EVENING FOR DIABETES  360  2   ??? METFORMIN 1000 MG ORAL TAB TAKE 1 TABLET BY MOUTH TWICE DAILY FOR DIABETES  120  3   ??? BD INSULIN SYRINGE ULT-FINE II 1/2 ML 31 X 5/16" MISC SYRINGE USE AS DIRECTED FOR DIABETES  100  5   ??? ONE TOUCH ULTRA TEST MISC STRIPS USE AS DIRECTED BY PHYSICIAN FOR DIABETES  200  6   ??? ONE TOUCH DELICA LANCETS MISC MISC USE AS DIRECTED BY PHYSICIAN  200  5   ??? SERTRALINE 100 MG ORAL TAB TAKE 1 TABLET BY MOUTH ONE TIME DAILY  60  3   ??? LOSARTAN  25 MG ORAL TAB TAKE 1 TABLET BY MOUTH ONE TIME DAILY  93  3   ??? OMEPRAZOLE 20 MG ORAL CPDR SR CAP TAKE 1 CAPSULE BY MOUTH TWICE DAILY FOR STOMACH 120  2   ??? ATENOLOL  50 MG ORAL TAB TAKE 1 TABLET BY MOUTH TWICE DAILY FOR BLOOD PRESSURE  180  3   ??? ALLOPURINOL 100 MG ORAL TAB TAKE 2 TABLETS BY MOUTH ONE TIME DAILY TO PREVENT GOUT  186  3   ??? NOVOLIN N 100 UNIT/ML SUBQ SUSP INJECT 22 UNITS SUBCUTANEOUSLY EVERY MORNING AND 35 UNITS EVERY EVENING AND MAY TREAT TO TARGET WITH A BLOOD SUGAR GOAL OF 90 TO 130 MG/DL PRIOR TO MEALS FOR DIABETES  20  5   ??? ASPIRIN   81 MG ORAL TBEC DR TAB 1 TAB PO DAILY  100  0   ??? SIMVASTATIN 40 MG ORAL TAB TAKE 1 TABLET BY MOUTH EVERY NIGHT AT BEDTIME  60  5   ??? 25 MG ORAL TAB 1/2 to 1 tablet three times daily as needed for vertigo  90  0   ??? VANIQA 13.9 % TOP CREA APPLY TO AFFECTED AREAS TWICE DAILY  30  6   ??? FLUTICASONE  50 MCG/ACTUATION NASL SPSN USE 2 SPRAYS IN EACH NOSTRIL ONE TIME DAILY  32  5   ??? QVAR 80 MCG/ACTUATION INHL AERO USE 2 PUFFS BY MOUTH TWICE DAILY FOR ASTHMA PREVENTION AND CONTROL  26.1  3   ??? PROAIR HFA 90 MCG/ACTUATION INHL HFAA USE 2 PUFFS BY MOUTH EVERY 4 TO 6 HOURS AS NEEDED  FOR WHEEZING AND COUGH  8.5  2   ??? VITAMIN D-3 2000 UNIT ORAL TAB Take 1-1-1/2 tabs po qd  45  3      Cardiovascular risk analysis - 61 year old female LDL goal is under 130  hypertension  obese  sedentary lifestyle.   ROS: taking medications as instructed, no medication side effects noted, no TIA's, no chest pain on exertion, no dyspnea on exertion, no swelling of ankles.   New concerns: .     Objective:   BP 130/82   Pulse 72   Temp(Src) 98.3 ??F (36.8 ??C) (Oral)   Resp 16   Wt 283 lb (128.368 kg)   Appearance alert, well appearing, and in no distress and overweight.  General exam BP noted to be well controlled today  in office, S1, S2 normal, no gallop, no murmur, chest clear, no JVD, no HSM, no edema.   REVIEWED lab with pt    Assessment:    Hyperlipidemia poorly controlled, needs to follow diet more regularly.     Plan:   lab results and schedule of future lab studies reviewed with patient  reviewed diet, exercise and weight control.

## 2011-12-13 NOTE — Progress Notes (Addendum)
Podiatric Office Visit:  The patient, Eileen Barnes, identity was verified by name and MRN.  Chief Complaint   Patient presents with   ??? POSTOPERATIVE EXAM        metatarsal stress fx. - right foot    ??? PLANTAR WARTS         right foot      SUBJECTIVE:  Chief Complaint: Eileen Barnes  is a 61 year old DM female with peripheral neuropathy  who complains of right foot pain and swelling    Nature: Aching and swelling despite neuropathy  location: right midfoot  duration: 10 days  onset: suddent but unknown if any injury  course: stable, not improving  aggravating/alleviating factors: worse with pressure, walking  priortreatment: none    [name] (M.D.), MEDICAL DOCTOR:  Referring Physician:    PMH:  Past Medical History   Diagnosis Date   ??? SEVERE OBESITY (BMI >= 40) 07/09/2004     MORBID OBESITY   ??? MENOPAUSE 08/16/2004     FEMALE CLIMACTERIC STATE   ??? ACTINIC KERATOSIS. 02/12/2003     ACTINIC KERATOSIS   ??? DM 2, UNCONTROLLED, W DIABETIC POLYNEUROPATHY.    ??? ESSENTIAL HTN    ??? HYPERLIPIDEMIA    ??? GOUT         MEDS:  Current Outpatient Prescriptions   Medication Sig Dispense Refill   ??? GLYBURIDE 5 MG ORAL TAB TAKE 1 TABLET BY MOUTH EVERY MORNING AND TAKE 3 TABLETS EVERY EVENING FOR DIABETES  360  2   ??? METFORMIN 1000 MG ORAL TAB TAKE 1 TABLET BY MOUTH TWICE DAILY FOR DIABETES  120  3   ??? BD INSULIN SYRINGE ULT-FINE II 1/2 ML 31 X 5/16" MISC SYRINGE USE AS DIRECTED FOR DIABETES  100  5   ??? SIMVASTATIN 40 MG ORAL TAB TAKE 1 TABLET BY MOUTH EVERY NIGHT AT BEDTIME  60  5   ??? MECLIZINE 25 MG ORAL TAB 1/2 to 1 tablet three times daily as needed for vertigo  90  0   ??? VANIQA 13.9 % TOP CREA APPLY TO AFFECTED AREAS TWICE DAILY  30  6   ??? ONE TOUCH ULTRA TEST MISC STRIPS USE AS DIRECTED BY PHYSICIAN FOR DIABETES  200  6   ??? ONE TOUCH DELICA LANCETS MISC MISC USE AS DIRECTED BY PHYSICIAN  200  5   ??? SERTRALINE 100 MG ORAL TAB TAKE 1 TABLET BY MOUTH ONE TIME DAILY  60  3   ??? LOSARTAN  25 MG ORAL TAB TAKE 1 TABLET BY MOUTH  ONE TIME DAILY  93  3   ??? OMEPRAZOLE 20 MG ORAL CPDR SR CAP TAKE 1 CAPSULE BY MOUTH TWICE DAILY FOR STOMACH  120  2   ??? ATENOLOL  50 MG ORAL TAB TAKE 1 TABLET BY MOUTH TWICE DAILY FOR BLOOD PRESSURE  180  3   ??? ALLOPURINOL 100 MG ORAL TAB TAKE 2 TABLETS BY MOUTH ONE TIME DAILY TO PREVENT GOUT  186  3   ??? FLUTICASONE  50 MCG/ACTUATION NASL SPSN USE 2 SPRAYS IN EACH NOSTRIL ONE TIME DAILY  32  5   ??? QVAR 80 MCG/ACTUATION INHL AERO USE 2 PUFFS BY MOUTH TWICE DAILY FOR ASTHMA PREVENTION AND CONTROL  26.1  3   ??? PROAIR HFA 90 MCG/ACTUATION INHL HFAA USE 2 PUFFS BY MOUTH EVERY 4 TO 6 HOURS AS NEEDED FOR WHEEZING AND COUGH  8.5  2   ??? NOVOLIN N  100 UNIT/ML SUBQ SUSP INJECT 22 UNITS SUBCUTANEOUSLY EVERY MORNING AND 35 UNITS EVERY EVENING AND MAY TREAT TO TARGET WITHA BLOOD SUGAR GOAL OF 90 TO 130 MG/DL PRIOR TO MEALS FOR DIABETES  20  5   ??? FUROSEMIDE 20 MG ORAL TAB TAKE 1 TO 2 TABLETS BY MOUTH ONE TIME DAILY FOR FLUID RETENTION AS NEEDED  100  2   ??? ASPIRIN   81 MG ORAL TBEC DR TAB 1 TAB PO DAILY  100  0   ??? VITAMIN D-3 2000 UNIT ORAL TAB Take 1-1-1/2 tabs po qd  45  3       ALLERGIES:  Allergies   Allergen Reactions   ??? Codeine And Opiate Derivatives Nausea and/or Vomiting   ??? Kiwi Fruit      Pt. States throat closes up    ??? Lisinopril      Cough     ??? Lovastatin    ??? Pamelor (Nortriptyline)      Severe constipation   ??? Talwin (Pentazocine Lactate) Nausea and/or Vomiting   ??? Tramadol Hcl Nausea and/or Vomiting   ??? Vicodin (Acetaminophen-Hydrocodone)      Vomiting         Past surgical history:  Past Surgical History   Procedure Date   ??? Lasik       Family history:  Family History   Problem Relation Age of Onset   ??? Other [Other] [OTHER] Mother      60 Years of Alzheimer's Disease and Living  with HTN, Thyroid problems   ??? Diabetes Father      Deceased With Complications, Dialysis patient   ??? Hypothyroidism Paternal Aunt      Hx of Grave's Disease   ??? Other [Other] [OTHER] Paternal Aunt      Neuropathy unrelated to  Diabetes   ??? Other [Other] [OTHER] Brother      Overweight with HX of DVT   ??? Breast Cancer Maternal Aunt    ??? Colon Cancer Maternal Aunt      x4   ??? Ovarian Cancer None    ??? Uterine Cancer None    ??? Diabetes Maternal Grandmother    ??? Asthma Son      Social history:  History     Social History   ??? Marital Status: Unknown     Spouse Name: Married     Number of Children: N/A   ??? Years of Education: N/A     Occupational History   ??? 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 Smoking status: Former Smoker -- 0.5 packs/day for 20 years     Quit date: 11/13/1993   ??? Smokeless tobacco: Never Used   ??? Alcohol Use: No   ??? Drug Use: No   ??? Sexually Active: Not Currently -- Female partner(s)      ascus (=) HPV with cryo     Other Topics Concern   ??? Not on file     Social History Narrative   ??? No narrative on file         Review of Systems -  Patient denies fever, chills, nausea, vomiting, diarrhea, calf pain, shortness of breath and chest pain.    Physical Exam: Patient is alert and oriented times three.     Vascular:   palpable Dorsalis Pedis and Posterior Tibial Pulses B/L  Capillary Fill time < 3 seconds to digits 1-5 B/L  skin temperature warm  to warm tibial tuberosity to the digits B/L  + edema dorsal right midfoot  NO cellulitis/lymphangitis present, no Homans or calf pain    Neurological:   diminished light touch/epicritic sensation B/L  absetn vibratory and sharp/dull sensations  absent protective sensation toes b/l.    Dermatological:   Skin appears well hydrated and supple. good color, texture, turgor. No open lesions present. No callosities present. Webspaces clean and dry 1-4 b/l. Nails 1-5 b/l appear normal. Small lesion central arch with divergent skin lines and pinpoint bleeding on debridement.    Musculoskeletal/Orthopaedic:   Structurally within normal limits  Toes 2-3 right are mildly divergent  Pain maximally over the 3rd metatarsal dorsall right  Increased warmth right  foot compared to left  +5/5 muscle strength Dorsiflexion, Plantarflexion, Inversion, Eversion B/L  ROM of the 1st MTPJ is full b/l.  ROM of the MTJ/STJ is full without pain or crepitus b/l.  Ankle joint ROM is full  B/L    Radiographs:  Bone stock WNL.  Cortical thickening of the 3rd metatarsal right foot with callus formation proximally, no fracture lines visible, no charcot changes    ASSESSMENT  Diagnosis   1. STRESS FX OF METATARSAL (733.94)    2. FOOT PAIN (729.5)    3. DM 2 W DIABETIC PERIPHERAL NEUROPATHY (250.60, 357.2)    4. PLANTARWART (078.12)        PLAN:  Orders Placed This Encounter   ??? CRYOTHERAPY, FLAT WARTS, UP TO 14   ??? CLOSED TREATMENT, FX OF METATARSAL, WO MANIP, EACH BONE   ??? XR FOOT, RIGHT WEIGHT BEARING 3 OR MORE    ??? REFERRAL DME EQUIPMENT OTHER.   ??? BOOT WALKING PNEUMATIC       Tx//    Office Visit - with a thorough review of the Patient's Medical History and Podiatric Physical Exam completed.  Etiology and treatment options for were discussed with the patient.  Limit walking, use CAM boot at all times when WB, remove to shower, sleep, rest, ankle ROM exercises  Ice and compress the right leg  Ace wrap  Patient was educated on mechanical and chemical DVT/PE prophylaxis. Patient again advised of signs and symptoms of DVT, PE. Advised to go directly to ED should these symptoms occur. Patient acknowledges understanding of above information.  F/u 2 weeks     AT NEXT VISIT, CHECK IN AT THE FRONT DESK THEN GO DIRECTLY TO XRAY FOR NEW XRAYS OF THE FOOT AND ANKLE.  WHEN FINISHED IN XRAY RETURN TO PODIATRY DEPT AND NOTIFY THE NURSE UPON YOUR RETURN.      Rx: CAM boot, DVT risk discussed, see pt instructions  Canthacur to wart right foot wart, application #1  See instructions for additional patient instructions  Rx: DM shoes and inserts    Earlie Raveling DPM PGY-3    Ethelene Browns A. Hartman Minahan, DPM

## 2011-12-13 NOTE — Patient Instructions (Addendum)
Limit walking, use CAM boot at all times when WB, remove to shower, sleep, rest, ankle ROM exercises  Ice and compress the right leg  Ace wrap  Patient was educated on mechanical and chemical DVT/PE prophylaxis. Patient again advised of signs and symptoms of DVT, PE. Advised to go directly to ED should these symptoms occur. Patient acknowledges understanding of above information.  F/u 2 weeks     AT NEXT VISIT, CHECK IN AT THE FRONT DESK THEN GO DIRECTLY TO XRAY FOR NEW XRAYS OF THE FOOT AND ANKLE.  WHEN FINISHED IN XRAY RETURN TO PODIATRY DEPT AND NOTIFY THE NURSE UPON YOUR RETURN.    A TYPE OF ACID HAS BEEN APPLIED TO THE PLANTAR WARTS.    KEEP COVERED, CLEAN AND DRY FOR 12 HOURS.    BATH AND SHOWER THE FOLLOWING DAY  OF APPLICATION AS NORMAL.    THE AREA IS EXPECTED TO BURN AND BLISTER IN 1-3 DAYS.    IF PAINFUL SOAK IN PLAIN COOL WATER, DRAIN THE BLISTER WITH STERILIZED, CLEAN NEEDLE, AND APPLY GAUZE PAD OR BANDAID TO AREA. REPEAT 2-3X/DAY AS NEEDED.    KEEP FOLLOW UP APPT TO HAVE WARTS EVALUATED FOR ADDITIONAL TREATMENT.

## 2011-12-13 NOTE — Progress Notes (Addendum)
Podiatric Office Visit:  The patient, Eileen Barnes, identity was verified by name and MRN.  Chief Complaint   Patient presents with   ??? POSTOPERATIVE EXAM        metatarsal stress fx. - right foot    ??? PLANTAR WARTS         right foot      SUBJECTIVE:  Chief Complaint: Eileen Barnes  is a 61 year old DM female with peripheral neuropathy presents for f/u stress fx 3rd metatarsal right and wart right foot.  No issues with wart treatment.  Pain in foot diminished with use of the CAM boot.  Limiting her walking in the CAM, removing to shower, sleep.    [name] (M.D.), MEDICAL DOCTOR:  Referring Physician:    PMH:  Past Medical History   Diagnosis Date   ??? SEVERE OBESITY (BMI >= 40) 07/09/2004     MORBID OBESITY   ??? MENOPAUSE 08/16/2004     FEMALE CLIMACTERIC STATE   ??? ACTINIC KERATOSIS. 02/12/2003     ACTINIC KERATOSIS   ??? DM 2, UNCONTROLLED, W DIABETIC POLYNEUROPATHY.    ??? ESSENTIAL HTN    ??? HYPERLIPIDEMIA    ??? GOUT         MEDS:  Current Outpatient Prescriptions   Medication Sig Dispense Refill   ??? GLYBURIDE 5 MG ORAL TAB TAKE 1 TABLET BY MOUTH EVERY MORNING AND TAKE 3 TABLETS EVERY EVENING FOR DIABETES  360  2   ??? METFORMIN 1000 MG ORAL TAB TAKE 1 TABLET BY MOUTH TWICE DAILY FOR DIABETES  120  3   ??? BD INSULIN SYRINGE ULT-FINE II 1/2 ML 31 X 5/16" MISC SYRINGE USE AS DIRECTED FOR DIABETES  100  5   ??? SIMVASTATIN 40 MG ORAL TAB TAKE 1 TABLET BY MOUTH EVERY NIGHT AT BEDTIME  60  5   ??? MECLIZINE 25 MG ORAL TAB 1/2 to 1 tablet three times daily as needed for vertigo  90  0   ??? VANIQA 13.9 % TOP CREA APPLY TO AFFECTED AREAS TWICE DAILY  30  6   ??? ONE TOUCH ULTRA TEST MISC STRIPS USE AS DIRECTED BY PHYSICIAN FOR DIABETES  200  6   ??? ONE TOUCH DELICA LANCETS MISC MISC USE AS DIRECTED BY PHYSICIAN  200  5   ??? SERTRALINE 100 MG ORAL TAB TAKE 1 TABLET BY MOUTH ONE TIME DAILY  60  3   ??? LOSARTAN  25 MG ORAL TAB TAKE 1 TABLET BY MOUTH ONE TIME DAILY  93  3   ??? OMEPRAZOLE 20 MG ORAL CPDR SR CAP TAKE 1 CAPSULE BY MOUTH  TWICE DAILY FOR STOMACH  120  2   ??? ATENOLOL  50 MG ORAL TAB TAKE 1 TABLET BY MOUTH TWICE DAILY FOR BLOOD PRESSURE  180  3   ??? ALLOPURINOL 100 MG ORAL TAB TAKE 2 TABLETS BY MOUTH ONE TIME DAILY TO PREVENT GOUT  186  3   ??? FLUTICASONE  50 MCG/ACTUATION NASL SPSN USE 2 SPRAYS IN EACH NOSTRIL ONE TIME DAILY  32  5   ??? QVAR 80 MCG/ACTUATION INHL AERO USE 2 PUFFS BY MOUTH TWICE DAILY FOR ASTHMA PREVENTION AND CONTROL  26.1  3   ??? PROAIR HFA 90 MCG/ACTUATION INHL HFAA USE 2 PUFFS BY MOUTH EVERY 4 TO 6 HOURS AS NEEDED FOR WHEEZING AND COUGH  8.5  2   ??? NOVOLIN N 100 UNIT/ML SUBQ SUSP INJECT 22 UNITS SUBCUTANEOUSLY EVERY  MORNING AND35 UNITS EVERY EVENING AND MAY TREAT TO TARGET WITH A BLOOD SUGAR GOAL OF 90 TO 130 MG/DL PRIOR TO MEALS FOR DIABETES  20  5   ??? FUROSEMIDE 20 MG ORAL TAB TAKE 1 TO 2 TABLETS BY MOUTH ONE TIME DAILY FOR FLUID RETENTION AS NEEDED  100  2   ??? ASPIRIN   81 MG ORAL TBEC DR TAB 1 TAB PO DAILY  100  0   ??? VITAMIN D-3 2000 UNIT ORAL TAB Take 1-1-1/2 tabs po qd  45  3       ALLERGIES:  Allergies   Allergen Reactions   ??? Codeine And Opiate Derivatives Nausea and/or Vomiting   ??? Kiwi Fruit      Pt. States throat closes up    ??? Lisinopril      Cough     ??? Lovastatin    ??? Pamelor (Nortriptyline)      Severe constipation   ??? Talwin (Pentazocine Lactate) Nausea and/or Vomiting   ??? Tramadol Hcl Nausea and/or Vomiting   ??? Vicodin (Acetaminophen-Hydrocodone)      Vomiting         Past surgical history:  Past Surgical History   Procedure Date   ??? Lasik       Family history:  Family History   Problem Relation Age of Onset   ??? Other [Other] [OTHER] Mother      26 Years of Alzheimer's Disease and Living  with HTN, Thyroid problems   ??? Diabetes Father      Deceased With Complications, Dialysis patient   ??? Hypothyroidism Paternal Aunt      Hx of Grave's Disease   ??? Other [Other] [OTHER] Paternal Aunt      Neuropathy unrelated to Diabetes   ??? Other [Other] [OTHER] Brother      Overweight with HX of DVT   ??? Breast Cancer  Maternal Aunt    ??? Colon Cancer Maternal Aunt      x4   ??? OvarianCancer None    ??? Uterine Cancer None    ??? Diabetes Maternal Grandmother    ??? Asthma Son      Social history:  History     Social History   ??? Marital Status: Unknown     Spouse Name: Married     Number of Children: N/A   ??? Years of Education: N/A     Occupational History   ??? 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 Smoking status: Former Smoker -- 0.5 packs/day for 20 years     Quit date: 11/13/1993   ??? Smokeless tobacco: Never Used   ??? Alcohol Use: No   ??? Drug Use: No   ??? Sexually Active: Not Currently -- Female partner(s)      ascus (=) HPV with cryo     Other Topics Concern   ??? Not onfile     Social History Narrative   ??? No narrative on file         Review of Systems -  Patient denies fever, chills, nausea, vomiting, diarrhea, calf pain, shortness of breath and chest pain.    Physical Exam: Patient is alert and oriented times three.     Vascular:   palpable Dorsalis Pedis and Posterior Tibial Pulses B/L  Capillary Fill time < 3 seconds to digits 1-5 B/L  skin temperature warm to warm tibial tuberosity to the digits B/L  + edema  dorsal right midfoot  NO cellulitis/lymphangitis present, no Homans or calf pain    Neurological:   diminished light touch/epicritic sensation B/L  absetn vibratory and sharp/dull sensations  absent protective sensation toes b/l.    Dermatological:   Skin appears well hydrated and supple. good color, texture, turgor. No open lesions present. No callosities present. Webspaces clean and dry 1-4 b/l. Nails 1-5 b/l appear normal. Small lesion central arch debrided with residual verrucous lesion ~3-75mm in diameter.    Musculoskeletal/Orthopaedic:   Structurally within normal limits  Toes 2-3 right are mildly divergent  Pain maximally over the 3rd metatarsal dorsall right  Increased warmth right foot compared to left  +5/5 muscle strength Dorsiflexion, Plantarflexion, Inversion, Eversion  B/L  ROM of the 1st MTPJ is full b/l.  ROM of the MTJ/STJ is full without pain or crepitus b/l.  Ankle joint ROM is full  B/L    Prior Radiographs:  Bone stock WNL.  Cortical thickening of the 3rd metatarsal right foot with callus formation proximally, no fracture lines visible, no charcot changes    ASSESSMENT  Diagnosis   1. AFTERCARE HEALING TRAUMATIC FX (V54.19)    2. DM 2 W DIABETIC PERIPHERAL NEUROPATHY (250.60, 357.2)    3. PLANTAR WART (078.12)        PLAN:  Orders Placed This Encounter   ??? CRYOTHERAPY, FLAT WARTS, UP TO 14   ??? XR FOOT, RIGHT WEIGHT BEARING 3 OR MORE      Tx//    Office Visit and exam  Continue use CAM boot at all times when WB, remove to shower, sleep, rest, ankle ROM exercises  Ice and compress the right leg  Canthacur to wart right foot wart, application #2  Limit walking, use CAM boot at all times when WB, remove to shower, sleep, rest, ankle ROM exercises  Ice and compress the right leg  Ace wrap  Patient was educated on mechanical and chemical DVT/PE prophylaxis. Patient again advised of signs and symptoms of DVT, PE. Advised to go directly to ED should these symptoms occur. Patient acknowledges understanding of above information.  F/u 2 weeks     AT NEXT VISIT, CHECK IN AT THE FRONT DESK THEN GO DIRECTLY TO XRAY FOR NEW XRAYS OF THE FOOT AND ANKLE.  WHEN FINISHED IN XRAY RETURN TO PODIATRY DEPT AND NOTIFY THE NURSE UPON YOUR RETURN.    A TYPE OF ACID HAS BEEN APPLIED TO THE PLANTAR WARTS.  KEEP COVERED, CLEAN AND DRY FOR 12 HOURS.  BATH AND SHOWER THE FOLLOWING DAY  OF APPLICATION AS NORMAL.  THE AREA IS EXPECTED TO BURN AND BLISTER IN 1-3 DAYS.  IF PAINFUL SOAK IN PLAIN COOL WATER, DRAIN THE BLISTER WITH STERILIZED, CLEAN NEEDLE, AND APPLY GAUZE PAD OR BANDAID TO AREA. REPEAT 2-3X/DAY AS NEEDED.  KEEP FOLLOW UP APPT TO HAVE WARTS EVALUATED FOR ADDITIONAL TREATMENT.    Earlie Raveling DPM PGY-3    The resident/student was under my direct supervision during today's patient encounter. I  am in agreement with the medical chart documentation, as this is a reflection of my personal examination, assessment and treatment plan.    Sharren Bridge Dwayn Moravek, DPM

## 2011-12-15 ENCOUNTER — Telehealth

## 2011-12-15 NOTE — Progress Notes (Addendum)
I agree with the plan of care as defined, Lorey Pallett, NP

## 2011-12-15 NOTE — Progress Notes (Addendum)
SUBJECTIVE:    Eileen Barnes, is a 61 year old, female Type 2 DM identified for Diabetes Care management.   Spoke with pt today. Patient denies  ADR's and states adherence to medications.   Pt denies  hypoglycemic incidents.     SMBG : states fasting am have been good but has not been taking smbg other times per day   reports that she quit smoking about 18 years ago. She has never used smokeless tobacco.    Current Outpatient Prescriptions   Medication Sig Dispense Refill   ??? FUROSEMIDE 20 MG ORAL TAB TAKE 1 TO 2 TABLETS BY MOUTH ONCE A  DAY PRN FOR FLUID RETENTION  100  2   ??? GLYBURIDE 5 MG ORAL TAB TAKE 1 TABLET BY MOUTH EVERY MORNING AND TAKE 3 TABLETS EVERY EVENING FOR DIABETES  360  2   ??? METFORMIN 1000 MG ORAL TAB TAKE 1 TABLET BY MOUTH TWICE DAILY FOR DIABETES  120  3   ??? BD INSULIN SYRINGE ULT-FINE II 1/2 ML 31 X 5/16" MISC SYRINGE USE AS DIRECTED FOR DIABETES  100  5   ??? SIMVASTATIN 40 MG ORAL TAB TAKE 1 TABLET BY MOUTH EVERY NIGHT AT BEDTIME  60  5   ??? MECLIZINE 25 MG ORAL TAB 1/2 to 1 tablet three times daily as needed for vertigo  90  0   ??? VANIQA 13.9 % TOP CREA APPLY TO AFFECTED AREAS TWICE DAILY  30  6   ??? ONE TOUCH ULTRA TEST MISC STRIPS USE AS DIRECTED BY PHYSICIAN FOR DIABETES  200  6   ??? ONE TOUCH DELICA LANCETS MISC MISC USE AS DIRECTED BY PHYSICIAN  200  5   ??? SERTRALINE 100 MG ORAL TAB TAKE 1 TABLET BY MOUTH ONE TIME DAILY  60  3   ??? LOSARTAN  25 MG ORAL TAB TAKE 1 TABLET BY MOUTH ONE TIME DAILY  93  3   ??? OMEPRAZOLE 20 MG ORAL CPDR SR CAP TAKE 1 CAPSULE BY MOUTH TWICE DAILY FOR STOMACH  120  2   ??? ATENOLOL  50 MG ORAL TAB TAKE 1 TABLET BY MOUTH TWICE DAILY FOR BLOOD PRESSURE  180  3   ??? ALLOPURINOL 100 MG ORAL TAB TAKE 2 TABLETS BY MOUTH ONE TIME DAILY TO PREVENT GOUT  186  3   ??? FLUTICASONE  50 MCG/ACTUATION NASL SPSN USE 2 SPRAYS IN EACH NOSTRIL ONE TIME DAILY  32  5   ??? QVAR 80 MCG/ACTUATION INHL AERO USE 2 PUFFS BY MOUTH TWICE DAILY FOR ASTHMA PREVENTION AND CONTROL  26.1  3   ??? PROAIR  HFA 90 MCG/ACTUATION INHL HFAA USE 2 PUFFS BY MOUTH EVERY 4 TO 6 HOURS AS NEEDED FOR WHEEZING AND COUGH  8.5  2   ??? NOVOLIN N 100 UNIT/ML SUBQ SUSP INJECT 22 UNITS SUBCUTANEOUSLY EVERY MORNING AND 35 UNITS EVERY EVENING AND MAY TREAT TO TARGET WITH A BLOOD SUGAR GOAL OF 90 TO 130 MG/DL PRIOR TO MEALS FOR DIABETES  20  5   ??? ASPIRIN   81 MG ORAL TBEC DR TAB 1 TAB PO DAILY  100 0   ??? VITAMIN D-3 2000 UNIT ORAL TAB Take 1-1-1/2 tabs po qd  45  3         OBJECTIVE:    Last PCP OV: 12/11/11    LAST DIABETIC RETINAL EXAM (Ordered Date): 05/31/11     Vitals:  BP Readings from Last 3 Encounters:  12/13/11 138/74   12/11/11 130/82   11/29/11 144/80       Labs:     Diabetes 04/11/2011 04/11/2011 04/21/2011 12/08/2011   HBA1c 7.8 (H)   8.2 (H)   Fasting Glucose    154 (H)   Cholesterol 133   207 (H)   Triglyceride    217 (H)   LDL Direct 72   143 (H)   HDL 38 (L)   35 (L)   Creatinine 0.60  0.67 0.57   ALT    47   Potassium 4.3   4.3   Microalbumin/CR  <31.0 (H)  17.0          Immunization History   Administered Date(s) Administered   ??? INF (Influenza) unspecified formulation 09/24/1991, 08/25/1992, 10/30/2000, 10/17/2001, 10/04/2002, 09/15/2003, 09/08/2004   ??? INF H1N1-09 standard dose (Influenza H1N1-09). 11/03/2008   ??? INFs 60yrs and over (Influenza) 08/28/2008, 08/31/2010   ??? INFs 68yrs and over (influenza) 11/29/2007   ??? INFs pres free 87yrs-adult (Influenza) 09/16/2011   ??? PNUps (Pneumococcal polysaccharide, pneumonia) 10/10/2006   ??? Td 35yrs-adult (Tetanus, diphtheria) 04/07/1993, 10/10/2006       Allergies   Allergen Reactions   ??? Codeine And Opiate Derivatives Nausea and/or Vomiting   ??? Kiwi Fruit      Pt. States throat closes up    ??? Lisinopril      Cough     ??? Lovastatin    ??? Pamelor (Nortriptyline)      Severe constipation   ??? Talwin (Pentazocine Lactate) Nausea and/or Vomiting   ??? Tramadol Hcl Nausea and/or Vomiting   ??? Vicodin (Acetaminophen-Hydrocodone)      Vomiting           ASSESSMENT    1. DM: A1C goal <7.5%, pt is  notcontrolled  2. Hyperlipidemia:LDL goal <100 mg/dl, pt is not controlled  3. Hypertension: BP goal <140/90 mm HG, pt is controlled   4. A-L-L meds currently taking: ASA yes, statin has not been taking, Lisinopril yes  5. Diabetic retinopathy? Dilated eye exam not needed.    PLAN:    1.Medications:  HBA1c- 8.2   Continue as ordered, agrees to increase insulin for ttt and will increase to 24 units am and 37 units hs     2. Labs:   Diabetes 04/11/2011 04/11/2011 04/21/2011 12/08/2011   HBA1c 7.8 (H)   8.2 (H)   Fasting Glucose    154 (H)   Cholesterol 133   207 (H)   Triglyceride    217 (H)   LDL Direct 72   143 (H)   HDL 38 (L)   35 (L)   Creatinine 0.60  0.67 0.57   ALT    47   Potassium 4.3   4.3   Microalbumin/CR  <31.0 (H)  17.0      3. Follow up by Care Manager after labs     4. Education given to pt: discussed diet and labs and medications

## 2011-12-18 NOTE — Progress Notes (Addendum)
Member agreeable.

## 2011-12-22 ENCOUNTER — Telehealth

## 2011-12-22 NOTE — Progress Notes (Addendum)
SUBJECTIVE:   Eileen Barnes, is a 61 year old, female Type 2 DM identified for Diabetes Care management.   Spoke with pt today. Patient denies ADR's and states adherence to medications.   Pt denies hypoglycemic incidents.   SMBG :    fasting am   2/4 126  2/5 139  2/6 123  2/7 101  2/8 152         Using 25 units am and 37 units pm.  reports that she quit smoking about 18 years ago. She has never used smokeless tobacco.   Current Outpatient Prescriptions    Medication  Sig  Dispense  Refill    ???  FUROSEMIDE 20 MG ORAL TAB  TAKE 1 TO 2 TABLETS BY MOUTH ONCE A DAY PRN FOR FLUID RETENTION  100  2    ???  GLYBURIDE 5 MG ORAL TAB  TAKE 1 TABLET BY MOUTH EVERY MORNING AND TAKE 3 TABLETS EVERY EVENING FOR DIABETES  360  2    ???  METFORMIN 1000 MG ORAL TAB  TAKE 1 TABLET BY MOUTH TWICE DAILY FOR DIABETES  120  3    ???  BD INSULIN SYRINGE ULT-FINE II 1/2 ML 31 X 5/16" MISC SYRINGE  USE AS DIRECTED FOR DIABETES  100  5    ???  SIMVASTATIN 40 MG ORAL TAB  TAKE 1 TABLET BY MOUTH EVERY NIGHT AT BEDTIME  60  5    ???  MECLIZINE 25 MG ORAL TAB  1/2 to 1 tablet three times daily as needed for vertigo  90  0    ???  VANIQA 13.9 % TOP CREA  APPLY TO AFFECTED AREAS TWICE DAILY  30  6    ???  ONE TOUCH ULTRA TEST MISC STRIPS  USE AS DIRECTED BY PHYSICIAN FOR DIABETES  200  6    ???  ONE TOUCH DELICA LANCETS MISC MISC  USE AS DIRECTED BY PHYSICIAN  200  5    ???  SERTRALINE 100 MG ORAL TAB  TAKE 1 TABLET BY MOUTH ONE TIME DAILY  60  3    ???  LOSARTAN 25 MG ORAL TAB  TAKE 1 TABLET BY MOUTH ONE TIME DAILY  93  3    ???  OMEPRAZOLE 20 MG ORAL CPDR SR CAP  TAKE 1 CAPSULE BY MOUTH TWICE DAILY FOR STOMACH  120  2    ???  ATENOLOL 50 MG ORAL TAB  TAKE 1 TABLET BY MOUTH TWICE DAILY FOR BLOOD PRESSURE  180  3    ???  ALLOPURINOL 100 MG ORAL TAB  TAKE 2 TABLETS BY MOUTH ONE TIME DAILY TO PREVENT GOUT  186  3    ???  FLUTICASONE 50 MCG/ACTUATION NASL SPSN  USE 2 SPRAYS IN EACH NOSTRIL ONE TIME DAILY  32  5    ???  QVAR 80 MCG/ACTUATION INHL AERO  USE 2 PUFFS BY MOUTH TWICE  DAILY FOR ASTHMA PREVENTION AND CONTROL  26.1  3    ???  PROAIR HFA 90 MCG/ACTUATION INHL HFAA  USE 2 PUFFS BY MOUTH EVERY 4 TO 6 HOURS AS NEEDED FOR WHEEZING AND COUGH  8.5  2    ???  NOVOLIN N 100 UNIT/ML SUBQ SUSP  INJECT 22 UNITS SUBCUTANEOUSLY EVERY MORNING AND 35 UNITS EVERY EVENING AND MAY TREAT TO TARGET WITH A BLOOD SUGAR GOAL OF 90 TO 130 MG/DL PRIOR TO MEALS FOR DIABETES  20  5    ???  ASPIRIN 81 MG ORAL  TBEC DR TAB  1 TAB PO DAILY  100  0    ???  VITAMIN D-3 2000 UNIT ORAL TAB  Take 1-1-1/2 tabs po qd  45  3    OBJECTIVE:   Last PCP OV: 12/11/11   LAST DIABETIC RETINAL EXAM (Ordered Date): 05/31/11   Vitals:   BP Readings from Last 3 Encounters:    12/13/11  138/74    12/11/11  130/82    11/29/11  144/80      Labs:   Diabetes  04/11/2011  04/11/2011  04/21/2011  12/08/2011    HBA1c  7.8 (H)    8.2 (H)    Fasting Glucose     154 (H)    Cholesterol  133    207 (H)    Triglyceride     217 (H)    LDL Direct  72    143 (H)    HDL  38 (L)    35 (L)    Creatinine  0.60   0.67  0.57    ALT     47    Potassium  4.3    4.3    Microalbumin/CR   <31.0 (H)   17.0      Immunization History    Administered  Date(s) Administered    ???  INF (Influenza) unspecified formulation  09/24/1991, 08/25/1992, 10/30/2000, 10/17/2001, 10/04/2002, 09/15/2003, 09/08/2004    ???  INF H1N1-09 standard dose (Influenza H1N1-09).  11/03/2008    ???  INFs 73yrs and over (Influenza)  08/28/2008, 08/31/2010    ???  INFs 77yrs and over (influenza)  11/29/2007    ???  INFs pres free 32yrs-adult (Influenza)  09/16/2011    ???  PNUps (Pneumococcal polysaccharide, pneumonia)  10/10/2006    ???  Td 18yrs-adult (Tetanus, diphtheria)  04/07/1993, 10/10/2006      Allergies    Allergen  Reactions    ???  Codeine And Opiate Derivatives  Nausea and/or Vomiting    ???  Kiwi Fruit       Pt. States throat closes up    ???  Lisinopril       Cough    ???  Lovastatin     ???  Pamelor (Nortriptyline)       Severe constipation    ???  Talwin (Pentazocine Lactate)  Nausea and/or Vomiting    ???  Tramadol  Hcl  Nausea and/or Vomiting    ???  Vicodin (Acetaminophen-Hydrocodone)       Vomiting      ASSESSMENT   1. DM: A1C goal <7.5%, pt is notcontrolled   2. Hyperlipidemia:LDL goal <100 mg/dl, pt is not controlled   3. Hypertension: BP goal <140/90 mm HG, pt is controlled   4. A-L-L meds currently taking: ASA yes, statin has not been taking, Lisinopril yes   5. Diabetic retinopathy? Dilated eye exam not needed.   PLAN:   1.Medications: HBA1c- 8.2   SMBG :    fasting am   2/4 126  2/5 139  2/6 123  2/7 101  2/8 152         Using 25 units am and 37 units pm.   Has  re-started simvastatin 40 mg on 12/15/11.  2. Labs: done 12/08/2011    12/22/2011 11:48 AM received a verbal order for  diab panel, liver profile in 8 weeks from Dr. Bobbye Morton.  Order repeated back to Dr. Bobbye Morton and order confirmed.  Diagnosis to be associated with order dm, diagnosis confirmed.  3.  Follow up by Care Manager after labs   4. Education given to pt: discussed diet and labs and medications

## 2011-12-27 ENCOUNTER — Encounter

## 2011-12-27 NOTE — Progress Notes (Addendum)
Podiatric Office Visit:  The patient, Eileen Barnes, identity was verified by name and MRN.  Chief Complaint   Patient presents with   ??? POSTOPERATIVE EXAM       stress fx.  - right foot - xray today    ??? FOLLOW UP CARE        wart - right foot     SUBJECTIVE:  Chief Complaint: Eileen Barnes  is a 61 year old DM female with peripheral neuropathy presents for f/u stress fx 3rd metatarsal right and wart right foot.  No issues with wart treatment; it causes slight discomfort for the evening of the Canthacur application.  Pain in foot diminished with use of the CAM boot.  Limiting her walking in the CAM, removing to shower, sleep.    [name] (M.D.), MEDICAL DOCTOR:  Referring Physician:    PMH:  Past Medical History   Diagnosis Date   ??? SEVERE OBESITY (BMI >= 40) 07/09/2004     MORBID OBESITY   ??? MENOPAUSE 08/16/2004     FEMALE CLIMACTERIC STATE   ??? ACTINIC KERATOSIS. 02/12/2003     ACTINIC KERATOSIS   ??? DM 2, UNCONTROLLED, W DIABETIC POLYNEUROPATHY.    ??? ESSENTIAL HTN    ??? HYPERLIPIDEMIA    ??? GOUT         MEDS:  Current Outpatient Prescriptions   Medication Sig Dispense Refill   ??? GLYBURIDE 5 MG ORAL TAB TAKE 1 TABLET BY MOUTH EVERY MORNING AND TAKE 3 TABLETS EVERY EVENING FOR DIABETES  360  2   ??? METFORMIN 1000 MG ORAL TAB TAKE 1 TABLET BY MOUTH TWICE DAILY FOR DIABETES  120  3   ??? BD INSULIN SYRINGE ULT-FINE II 1/2 ML 31 X 5/16" MISC SYRINGE USE AS DIRECTED FOR DIABETES  100  5   ??? SIMVASTATIN 40 MG ORAL TAB TAKE 1 TABLET BY MOUTH EVERY NIGHT AT BEDTIME  60  5   ??? MECLIZINE 25 MG ORAL TAB 1/2 to 1 tablet three times daily as needed for vertigo  90  0   ??? VANIQA 13.9 % TOP CREA APPLY TO AFFECTED AREAS TWICE DAILY  30  6   ??? ONE TOUCH ULTRA TEST MISC STRIPS USE AS DIRECTED BY PHYSICIAN FOR DIABETES  200  6   ??? ONE TOUCH DELICA LANCETS MISC MISC USE AS DIRECTED BY PHYSICIAN  200  5   ??? SERTRALINE 100 MG ORAL TAB TAKE 1 TABLET BY MOUTH ONE TIME DAILY  60  3   ??? LOSARTAN  25 MG ORAL TAB TAKE 1 TABLET BY MOUTH ONE  TIME DAILY 93  3   ??? OMEPRAZOLE 20 MG ORAL CPDR SR CAP TAKE 1 CAPSULE BY MOUTH TWICE DAILY FOR STOMACH  120  2   ??? ATENOLOL  50 MG ORAL TAB TAKE 1 TABLET BY MOUTH TWICE DAILY FOR BLOOD PRESSURE  180  3   ??? ALLOPURINOL 100 MG ORAL TAB TAKE 2 TABLETS BY MOUTH ONE TIME DAILY TO PREVENT GOUT  186  3   ??? FLUTICASONE  50 MCG/ACTUATION NASL SPSN USE 2 SPRAYS IN EACH NOSTRIL ONE TIME DAILY  32  5   ??? QVAR 80 MCG/ACTUATION INHL AERO USE 2 PUFFS BY MOUTH TWICE DAILY FOR ASTHMA PREVENTION AND CONTROL  26.1  3   ??? PROAIR HFA 90 MCG/ACTUATION INHL HFAA USE 2 PUFFS BY MOUTH EVERY 4 TO 6 HOURS AS NEEDED FOR WHEEZING AND COUGH  8.5  2   ???  NOVOLIN N 100 UNIT/ML SUBQ SUSP INJECT 22 UNITS SUBCUTANEOUSLY EVERY MORNING AND 35 UNITS EVERY EVENING AND MAY TREAT TO TARGET WITH A BLOOD SUGAR GOAL OF 90 TO 130 MG/DL PRIOR TO MEALS FOR DIABETES  20  5   ??? FUROSEMIDE 20 MG ORAL TAB TAKE 1 TO 2 TABLETS BY MOUTH ONE TIME DAILY FOR FLUID RETENTION AS NEEDED  100  2   ??? ASPIRIN   81 MG ORAL TBEC DR TAB 1 TAB PO DAILY  100  0   ??? VITAMIN D-3 2000 UNIT ORAL TAB Take 1-1-1/2 tabs po qd  45  3       ALLERGIES:  Allergies   Allergen Reactions   ??? Codeine And Opiate Derivatives Nausea and/or Vomiting   ??? Kiwi Fruit      Pt. States throat closes up    ??? Lisinopril      Cough     ??? Lovastatin    ??? Pamelor (Nortriptyline)      Severe constipation   ??? Talwin (Pentazocine Lactate) Nausea and/or Vomiting   ??? Tramadol Hcl Nausea and/or Vomiting   ??? Vicodin (Acetaminophen-Hydrocodone)      Vomiting         Past surgical history:  Past Surgical History   Procedure Date   ??? Lasik       Family history:  Family History   Problem Relation Age of Onset   ??? Other [Other] [OTHER] Mother      8 Years of Alzheimer's Disease and Living  with HTN, Thyroid problems   ??? Diabetes Father      Deceased With Complications, Dialysis patient   ??? Hypothyroidism Paternal Aunt      Hx of Grave's Disease   ??? Other [Other] [OTHER] Paternal Aunt      Neuropathy unrelated to Diabetes    ??? Other [Other] [OTHER] Brother      Overweight with HX of DVT   ??? Breast Cancer Maternal Aunt    ??? Colon Cancer Maternal Aunt      x4   ??? Ovarian Cancer None    ??? Uterine Cancer None    ??? Diabetes Maternal Grandmother    ??? Asthma Son      Social history:  History     Social History   ??? Marital Status: Unknown     Spouse Name: Married     Number of Children: N/A   ??? Years of Education: N/A     Occupational History   ??? 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 Smoking status: Former Smoker -- 0.5 packs/day for 20 years     Quit date: 11/13/1993   ??? Smokeless tobacco: Never Used   ??? Alcohol Use: No   ??? Drug Use: No   ??? Sexually Active: Not Currently -- Female partner(s)      ascus (=) HPV with cryo     Other Topics Concern   ??? Not on file     Social History Narrative   ??? No narrative on file         Review of Systems -  Patient denies fever, chills, nausea, vomiting, diarrhea, calf pain, shortness of breath and chest pain.    Physical Exam: Patient is alert and oriented times three.     Vascular:   palpable Dorsalis Pedis and Posterior Tibial Pulses B/L  Capillary Fill time < 3 seconds to digits 1-5 B/L  skin temperature warm to warm tibial tuberosity to the digits B/L  + edema dorsal right midfoot  NO cellulitis/lymphangitis present, no Homans or calf pain    Neurological:   diminished light touch/epicritic sensation B/L  absetn vibratory and sharp/dull sensations  absent protective sensation toes b/l.    Dermatological:   Skin appears well hydrated and supple. good color, texture, turgor. No open lesionspresent. No callosities present. Webspaces clean and dry 1-4 b/l. Nails 1-5 b/l appear normal.   Small lesion central arch debrided.   No residual verrucous lesion after debridement.    Musculoskeletal/Orthopaedic:   Structurally within normal limits  Toes 2-3 right are mildly divergent  No pain to palpation over the 3rd metatarsal dorsal right  Increased warmth right foot  compared to left  +5/5 muscle strength Dorsiflexion, Plantarflexion, Inversion, Eversion B/L  ROM of the 1st MTPJ is full b/l.  ROM of the MTJ/STJ is full without pain or crepitus b/l.  Ankle joint ROM is full  B/L    Radiographs:  Bone stock WNL.  Cortical thickening of the 3rd metatarsal right foot with callus formation proximally, no fracture lines visible, no charcot changes    ASSESSMENT  Diagnosis   1. AFTERCARE HEALING TRAUMATIC FX (V54.19)    2. DM 2 W DIABETIC PERIPHERAL NEUROPATHY (250.60, 357.2)    3. PLANTAR WART (078.12)        PLAN:  Orders Placed This Encounter   ??? CRYOTHERAPY, FLAT WARTS, UP TO 14   ??? XR FOOT, RIGHT WEIGHT BEARING 3 OR MORE      Tx//    Office Visit and exam  Discontinue use CAM boot and return to dm shoes and orthotics shoe gear as tolerated  Canthacur to wart right foot wart, application #3,   Patient acknowledges understanding of above information.  F/u PRN     A TYPE OF ACID HAS BEEN APPLIED TO THE PLANTAR WARTS.  KEEP COVERED, CLEAN AND DRY FOR 12 HOURS.  BATH AND SHOWER THE FOLLOWING DAY  OF APPLICATION AS NORMAL.  THE AREA IS EXPECTED TO BURN AND BLISTER IN 1-3 DAYS.  IF PAINFUL SOAK IN PLAIN COOL WATER, DRAIN THE BLISTER WITH STERILIZED, CLEAN NEEDLE, AND APPLY GAUZE PAD OR BANDAID TO AREA. REPEAT 2-3X/DAY AS NEEDED.  KEEP FOLLOW UP APPT TO HAVE WARTS EVALUATED FOR ADDITIONAL TREATMENT.      Sharren Bridge Miesha Bachmann, DPM

## 2012-01-10 ENCOUNTER — Telehealth

## 2012-01-10 NOTE — Telephone Encounter (Addendum)
Rx called in to [facility] and member made aware.

## 2012-01-10 NOTE — Telephone Encounter (Addendum)
Name of medication:  ATENOLOL 50 MG ORAL TAB    Pharmacy Name:[facility]    Pharmacy Address: [facility]    Pharmacy Phone Number: [phone]    Contact number for member:   Telephone Information:   Home Phone [phone]   Work Phone Not on file.     Patient instructed to contact the pharmacy prior to picking up the medication.    Patient advised that office/PCP has 24-48 business hours to return their call.

## 2012-01-10 NOTE — Progress Notes (Addendum)
Eileen Barnes  Has been referred to the Bariatric Weight Loss Program and has been contacted to attend a session of the orientation class at the [place] facility on 01/22/12 at 6 PM.  Mbr will   bring a support person with them to the class.  Reminder letter to be sent   2 weeks before class.

## 2012-01-10 NOTE — Telephone Encounter (Addendum)
Okey Regal can you please assist. Thank You

## 2012-01-10 NOTE — Telephone Encounter (Addendum)
Atenolol reordered. Please call in RX - C. Keaja Reaume CRNP

## 2012-01-11 ENCOUNTER — Encounter

## 2012-01-22 ENCOUNTER — Encounter

## 2012-01-23 ENCOUNTER — Encounter

## 2012-01-29 NOTE — Progress Notes (Addendum)
Member attended Bariatric Orientation Class.  A panel discussion included:    Introduction to the Bariatric Surgery Dept. and team members on the discussion panel.    A power point presentationof surgery options (gastric banding, gastric bypass, and gastric sleeve option)    Review of the information contained in the Bariatric Surgery Orientation Folder. Participants advised that they must review and complete the Bariatric Assessment form and PHQ9. They must be returned to the Bariatric CM in order to begin participation in the Program.     Introduction to Medical Nutrition Therapy Dept. Discussion included developing healthy eating habits before and after surgery.  Discussion of necessary dietary changes pre- and post operatively. Emphasis on lifelong and permanent change to be successful with weight loss with support provided by this department.    Introduction to Behavioral Health Dept. as an important service to the member learning healthy lifestyle management and adapting to permanent change. Emphasized support of the member throughout the weight loss process pre and post operatively by this department.     Question and answer time followed with open discussion.    If member states they are intending to pursue the bariatric program then the member was given a blue folder with a personal health assessment and PHQ9 form to complete and return to the bariatric dept.  Next steps will be determined by the bariatric case mgr. after receiving the assessment forms. The member will be contacted at that time with instructions to proceed with the program.  If forms are not returned as instructedthen mbr will not be contacted as it will be assumed the member is not interested in the program as bariatric surgery is elective.    If the member was only interested in weight mgmt with the Nutritional Services Dept. Or with Behavioral Health services, a form with how to make appointments with those departments was given to the  member.    If the member is not interested in the program at this time no further follow up is needed unless the member initiates.

## 2012-02-05 ENCOUNTER — Telehealth

## 2012-02-05 NOTE — Progress Notes (Addendum)
Spoke with Eileen Barnes  to develop a plan of care for continuing  through the bariatric program.    The following are noted:  I have received the personal health assessment form on  01/24/12.  I have received the PHQ9 assessment form on   no.  Does the PHQ9 require immediate attention by the BHS dept?  no    (If yes,  then forwarding pt info to BHS for their attention.)  I have entered the PHQ9 into [employer] under questionnaires on  02/05/12.  Scanning these forms into webview on  01/24/12/01/24/12.  Member notified that forms received on  01/24/12.    61 year old  Estimated Body mass index is 44.32 kg/(m^2) as calculated from the following:    Height as of 10/09/11: 5\' 7" [pt. reported.[(1.702 m).    Weight as of 12/11/11: 283 lb(128.368 kg). (f 50 or greater then send for pulmo/cardio clearance as required by the anesthesia team at [facility]).  yes    EKG due?   yes  Order pended to PCP?  Yes needs cbc/diff, PTH, VIT D   Initial labs due?  yes  Order pended to PCP?  yes    For network members, a standard letter is faxed to the network provider concerning orders for labs, ekg, and clearances that need to be ordered.  This letter was faxed to Dr.   On (date)    To (Fax number) .  Fax number verified by office staff prior to sending. This letter can be viewed in [facility].    Past sleep study done (no or date) none  On CPAP?  no  Does member snore/awaken choking/gasping/or short of breath?   Snore, cannot sleep on back  Epworth Sleepiness Scale:  0= no chance of dozing  1= slight chance of dozing  2= moderate chance of dozing  3= high chance of dozing    ?? 1. Sitting and reading 000  ?? 2. Watching TV  0  ?? 3. Sitting inactive in a public place (theater or meeting)  0  ?? 4. As a passenger in a car for an hour without a break  0  ?? 5. Lying down to rest in the afternoon when circumstances permit  3  ?? 6. Sitting and talking with someone  0  ?? 7. Sitting quietly after a lunch without alcohol.  0  ?? 8. In a car while stopped for  traffic for a few minutes.  0  TOTAL:  3  1-6 normal;  7-9 average;  10 and up suggest you may suffer from excessive sleepiness and should seek medical attention/sleep study.  _______________________________________________________  Further instructions to member at this time are:        ?? Make first visit appt with dietary?   02/15/12  ?? Make first BHS visit ?   She will call  ?? Date of last visit with PCP   12/11/11   If > 6 months member needs an updated visit with PCP  ?? If member is >age 53/ BMI 50 or greater  and/or has a cardiac/pulmonary hx member will need medical clearance from either/both of those depts.  Member will be referred byPCP and will be contacted by pulmonary medicine and/or cardiology for an appt. yes  ?? Any other medical clearances needed?  no    ?? Have labs/EKG done once order has been signed by PCP and then directed by Bariatric Case MGR.  Mbr agrees?  yes  ??

## 2012-02-13 NOTE — Progress Notes (Addendum)
Dr. Pricilla Holm,  Your patient has been accepted into our Bariatric surgery program!  In addition to the blood work she had on 12/08/11, Please cosign the pending bariatric lab orders.  Please refer member to see cardiology and pulmonary medicine for surgery clearance.  This is required for age over 79.    Her last   HGBA1C   Date Value Range Status   12/08/2011 8.2* 3.4 to 5.9 (%) Final      (NOTE)      All Ages:       A1c Value   Interpretation       < 5.7%    Non-diabetic       5.7-6.4%   Increased risk for DM       > or = 6.5%   Consistent with DM      07/19/11      Analysis Performed by [facility] Reference laboratory: [id number] [place]., [address]     We recommend her  HGBA1C be less than 7.5 before surgery    Thank you,  Abbey Chatters RN  Bariatric Case Manager  [facility]  [phone]  [phone]

## 2012-02-15 NOTE — Progress Notes (Addendum)
The patient, Eileen Barnes's, identity was verified by name and mrn.    Bariatric Obesity Program visit #1, DM2    Nutrition history and assessment:    Informant: self    Profession: retired LPN    Medications reviewed:    Current Outpatient Prescriptions on File Prior to Visit   Medication Sig Dispense Refill   ??? TRAZODONE  50 MG ORAL TAB TAKE 1/2 TO 2 TABLETS BY MOUTH EVERY NIGHT AT BEDTIME AS NEEDED FOR SLEEP  120  3   ??? OMEPRAZOLE 20 MG ORAL CPDR SR CAP TAKE 1 CAPSULE BY MOUTH TWICE DAILY FOR STOMACH  120  2   ??? ATENOLOL  50 MG ORAL TAB TAKE 1 TABLET BY MOUTH TWICE DAILY FOR BLOOD PRESSURE  180  3   ??? HUMULIN N 100 UNIT/ML SUBQ SUSP INJECT 24 UNITS SUBCUTANEOUSLY EVERY MORNING AND 35 UNITS EVERY EVENING FOR DIABETES AND MAY TREAT TO TARGET ADJUSTING DOSE AS NEEDED  20  5   ??? FUROSEMIDE 20 MG ORAL TAB TAKE 1 TO 2 TABLETS BY MOUTH ONCE A  DAY PRN FOR FLUID RETENTION  100  2   ??? GLYBURIDE 5 MG ORAL TAB TAKE 1 TABLET BY MOUTH EVERY MORNING AND TAKE 3 TABLETS EVERY EVENING FOR DIABETES  360  2   ??? METFORMIN 1000 MG ORAL TAB TAKE 1 TABLET BY MOUTH TWICE DAILY FOR DIABETES  120  3   ??? BD INSULIN SYRINGE ULT-FINE II 1/2 ML 31 X 5/16" MISC SYRINGE USE AS DIRECTED FOR DIABETES  100  5   ??? SIMVASTATIN 40 MG ORAL TAB TAKE 1 TABLET BY MOUTH EVERY NIGHT AT BEDTIME  60  5   ??? MECLIZINE 25 MG ORAL TAB 1/2 to 1 tablet three times daily as needed for vertigo  90  0   ??? VANIQA 13.9 % TOP CREA APPLY TO AFFECTED AREAS TWICE DAILY  30  6   ??? ONE TOUCH ULTRA TEST MISC STRIPS USE AS DIRECTED BY PHYSICIAN FOR DIABETES  200  6   ??? ONE TOUCH DELICA LANCETS MISC MISC USE AS DIRECTED BY PHYSICIAN  200  5   ??? SERTRALINE 100 MG ORAL TAB TAKE 1 TABLET BY MOUTH ONE TIME DAILY  60  3   ??? LOSARTAN  25 MG ORAL TAB TAKE 1 TABLET BY MOUTH ONE TIME DAILY  93  3   ??? ALLOPURINOL 100 MG ORAL TAB TAKE 2 TABLETS BY MOUTH ONE TIME DAILY TO PREVENT GOUT  186  3   ??? FLUTICASONE  50 MCG/ACTUATION NASL SPSN USE 2 SPRAYS IN EACH NOSTRIL ONE TIME DAILY  32  5    ??? QVAR 80 MCG/ACTUATION INHL AERO USE 2 PUFFS BY MOUTH TWICE DAILY FOR ASTHMA PREVENTION AND CONTROL  26.1  3   ??? PROAIR HFA 90 MCG/ACTUATION INHL HFAA USE 2 PUFFS BY MOUTH EVERY 4 TO 6 HOURS AS NEEDED FOR WHEEZING AND COUGH  8.5  2   ??? ASPIRIN   81 MGORAL TBEC DR TAB 1 TAB PO DAILY  100  0   ??? VITAMIN D-3 2000 UNIT ORAL TAB Take 1-1-1/2 tabs po qd  45  3       Supplements/herbs/alternative therapy: N    Food allergies/intolerances: [place], [place] IBS    Diet Prescription (per physician): N    Appetite: GOOD  chewing:  swallowing:  nausea:  vomiting:  GERD: Y  Bowel habits: IBS    Family Hx:  Diabetes: FATHER, MGM  Heart Disease: N  HTN: FATHER, MOTHER  Obesity:FATHER, MOTHER  Kidney: N  Cancer: N      Physical activity level: LOW-HAS BOW FLEX, RECUMBENT BIKE AND BOW FLEX    When did wt gain start? HEAVY MOST OF ADULT WIFE  Diets tried in past DIET PILLS-LOST 100 LBS, SWAM 3X/WEEK, [employer]  Previous diet instruction: Y    Number of meals eaten out/week: where? " a lot"-2x/week, [place], [employer] [place] burger    WAKES 630  COFFEE-FLAVORED CREAMER  2 HB EGGS, 1/2 CUP LOW SODIUM V 8 JUICE, OR 1 EGG AND 1 SL TOAST AND 1 PAT BUTTER OR MG CEREAL, MILK ONE PERCENT  S: HANDFUL ALMONDS OR SL MUNSTER OR SWISS CHEESE  L: VEGGIE BURGER W/ [employer] BUN AND 1 SL CHEESE, TOM, AVOCADO AND APPLE OR [employer] [place] BURGER  S: "DOWN FALL" APPLE AND 1 SL CHEESE OR ALMONDSOR CHEESE AND 4 CRACKERS  D: CHICKEN FISH OR BEEF "TOO MUCH OR [employer] PASTA, MIXED VEG OR BRUSSEL SPROUTS AND SALAD AND LF DRESSING, BAKED POTATO AND SALSA AND LF SOUR CREAM    Takes BG fasting only 145 - 210, "RN wants me to also take before D"    Fluids:  Water: 48 - 64 0Z  Soda: N, plain iced tea  Alcohol: RARELY    Who shops for food:PT  Who cooks:PT    Assessment:      S: Husband only eats beef, also has DM2.    Wt Readings from Last 3 Encounters:   02/15/12 283 lb 12.8 oz (128.731 kg)   12/11/11 283 lb (128.368 kg)   10/09/11 279 lb (126.554 kg)     O:BMI-44.44    Labs:    GLUCOSE,  RANDOM   Date Value Range Status   04/11/2011 165* 80-115 (MG/DL) Final   96/02/5408 811* 80-115 (MG/DL) Final   91/02/7828 562* 80-115 (MG/DL) Final        GLUCOSE, FASTING   Date Value Range Status   12/08/2011 154* 70-110 (MG/DL) Final   12/13/8655 846* 70-110 (MG/DL) Final   07/20/2951 Pending  70-110 (MG/DL) Incomplete   06/17/1323 176* 70-110 (MG/DL) Final        [id number]   Date Value Range Status   02/13/2012 Pending   Incomplete   12/08/2011 8.2* 3.4 to 5.9 (%) Final      (NOTE)      All Ages:       A1c Value   Interpretation       < 5.7%    Non-diabetic       5.7-6.4%   Increased risk for DM       > or = 6.5%   Consistent with DM      07/19/11      Analysis Performed by [facility] Reference laboratory: [id number] [place]., [address]   04/11/2011 7.8* 3.4 to 5.9 (%) Final      (NOTE)      Adults:       A1c Value   Interpretation       < 6%    Non-diabetic       6.0-6.4%   Increased risk for DM       > or = 6.5%   Consistent with DM      Peds (<18y/o)       < 5.7%    Non-diabetic       5.7-6.4%   Increased risk for DM       >  or = 6.5%   Consistent with DM      Analysis Performed by [facility] Reference laboratory: [id number] [place]., [address]        MICROALBUMIN, URINE   Date Value Range Status   02/13/2012 <0.5  <3.0 (MG/DL) Final   04/10/4131 3.3* <3.0 (MG/DL) Final   4/40/1027 <2.5  <3.0 (MG/DL) Final        MICROALBUMIN/CREATININE RATIO, URINE   Date Value Range Status   02/13/2012 <30.8* <30 (MG/G) Final   12/08/2011 17.0  <30 (MG/G) Final   04/11/2011 <31.0* <30 (MG/G) Final     CHOLESTEROL   Date Value Range Status   02/13/2012 141  <200 (MG/DL) Final   3/66/4403 474* <200 (MG/DL) Final   2/59/5638 756  <200 (MG/DL) Final        TRIGLYCERIDE   Date Value Range Status   12/08/2011 217* <150 (MG/DL) Final   43/01/2950 884* <150- (MG/DL) Final   1/66/0630 160* <150- (MG/DL) Final        HDL   Date Value Range Status   02/13/2012 37* >40 (MG/DL) Final      (NOTE)      >=60 mg/dl is Desirable    11/21/3233 35* >40 (MG/DL) Final      (NOTE)      >=60 mg/dl is Desirable   5/73/2202 38* >40 (MG/DL) Final      (NOTE)      >=60 mg/dl is Desirable        LDL DIRECT   Date Value Range Status   02/13/2012 86  30-130 (MG/DL) Final      (NOTE)      LESS THAN 100 MG/DL      OPTIMAL      542 TO 129 MG/DL         NEAR OPTIMAL/ABOVE OPTIMAL      130 TO 159 MG/DL         BORDERLINE HIGH      160 TO 189 MG/DL         HIGH      706 MG/DL AND ABOVE      VERY HIGH   12/08/2011 143* 30-130 (MG/DL) Final      (NOTE)      LESS THAN 100 MG/DL      OPTIMAL      237 TO 129 MG/DL         NEAR OPTIMAL/ABOVE OPTIMAL      130 TO 159 MG/DL         BORDERLINE HIGH      160 TO 189 MG/DL         HIGH      628 MG/DL AND ABOVE      VERY HIGH   04/11/2011 72  30-130 (MG/DL) Final      (NOTE)      LESS THAN 100 MG/DL      OPTIMAL      315 TO 129 MG/DL         NEAR OPTIMAL/ABOVE OPTIMAL      130 TO 159 MG/DL         BORDERLINE HIGH      160 TO 189 MG/DL         HIGH      176 MG/DL AND ABOVE      VERY HIGH        LDL CALCULATED   Date Value Range Status   12/21/2004 93  50-140 (MG/DL)  Final   02/05/2004 84  50-140 (MG/DL) Final     A: Excessive energy intake r/t large food portions, more than the recommdeded. Pt states she knows her portions are too big. Gave comprehensive diabetes nutrition education. Was in Program in 2007, states did not continue because was advised by RNs not to d/t risks. 1,500 calorie plan HO given.    P: 1,500 calorie diabetes meal plan    Your weight goal is 270 lbs.    Start keeping a food log and bring to next appointment.    Patient's goals:    Reduce food portions overall.    Start some type of physical activity.      Plan/Pt Education: Comprehensive DM nutrition ed  Reviewed bariatric program protocols:Y  Guidelines and contract given:Y  Starting Weight: 283.8 lbs  Wt. Goal: 270 lbs      F/U: Bariatric wt. Class 5/2 sched, ck wt, A1c    Gradie Butrick L. Dayton Martes MS., RD., LD

## 2012-02-15 NOTE — Patient Instructions (Addendum)
Your weight goal is 270 lbs.    Start keeping a food log and bring to next appointment.    Patient's goals:    Reduce food portions overall.    Start some type of physical activity.

## 2012-02-15 NOTE — Progress Notes (Addendum)
SUBJECTIVE:   Eileen Barnes, is a 61 year old, female Type 2 DM identified for Diabetes Care management.   Spoke with pt today. Patient denies ADR's and states adherence to medications.   Pt denies hypoglycemic incidents.   [facility] :    Fasting am  4/3 145  4/4 191   On 25 units insulin am and 42 units pm.  reports that she quit smoking about 18 years ago. She has never used smokeless tobacco.   Current Outpatient Prescriptions    Medication  Sig  Dispense  Refill   ???  FUROSEMIDE 20 MG ORAL TAB  TAKE 1 TO 2 TABLETS BY MOUTH ONCE A DAY PRN FOR FLUID RETENTION  100  2    ???  GLYBURIDE 5 MG ORAL TAB  TAKE 1 TABLET BY MOUTH EVERY MORNING AND TAKE 3 TABLETS EVERY EVENING FOR DIABETES  360 2    ???  METFORMIN 1000 MG ORAL TAB  TAKE 1 TABLET BY MOUTH TWICE DAILY FOR DIABETES  120  3    ???  BD INSULIN SYRINGE ULT-FINE II 1/2 ML 31 X 5/16" MISC SYRINGE  USE AS DIRECTED FOR DIABETES  100  5    ???  SIMVASTATIN 40 MG ORAL TAB  TAKE 1 TABLET BY MOUTH EVERY NIGHT AT BEDTIME  60  5    ???  MECLIZINE 25 MG ORAL TAB  1/2 to 1 tablet three times daily as needed for vertigo  90  0    ???  VANIQA 13.9 % TOP CREA  APPLY TO AFFECTED AREAS TWICE DAILY  30  6    ???  ONE TOUCH ULTRA TEST MISC STRIPS  USE AS DIRECTED BY PHYSICIAN FOR DIABETES  200  6    ???  ONE TOUCH DELICA LANCETS MISC MISC  USE AS DIRECTED BY PHYSICIAN  200  5    ???  SERTRALINE 100 MG ORAL TAB  TAKE 1 TABLET BY MOUTH ONE TIME DAILY  60  3    ???  LOSARTAN 25 MG ORAL TAB  TAKE 1 TABLET BY MOUTH ONE TIME DAILY  93  3    ???  OMEPRAZOLE 20 MG ORAL CPDR SR CAP  TAKE 1 CAPSULE BY MOUTH TWICE DAILY FOR STOMACH  120  2    ???  ATENOLOL 50 MG ORALTAB  TAKE 1 TABLET BY MOUTH TWICE DAILY FOR BLOOD PRESSURE  180  3    ???  ALLOPURINOL 100 MG ORAL TAB  TAKE 2 TABLETS BY MOUTH ONE TIME DAILY TO PREVENT GOUT  186  3    ???  FLUTICASONE 50 MCG/ACTUATION NASL SPSN  USE 2 SPRAYS IN EACH NOSTRIL ONE TIME DAILY  32  5    ???  QVAR 80 MCG/ACTUATION INHL AERO  USE 2 PUFFS BY MOUTH TWICE DAILY FOR ASTHMA PREVENTION AND  CONTROL  26.1  3    ???  PROAIR HFA 90 MCG/ACTUATION INHL HFAA  USE 2 PUFFS BY MOUTH EVERY 4 TO 6 HOURS AS NEEDED FOR WHEEZING AND COUGH  8.5  2    ???  NOVOLIN N 100 UNIT/ML SUBQ SUSP  INJECT 22 UNITS SUBCUTANEOUSLY EVERY MORNING AND 35 UNITS EVERY EVENING AND MAY TREAT TO TARGET WITH A BLOOD SUGAR GOAL OF 90 TO 130 MG/DL PRIOR TO MEALS FOR DIABETES  20  5    ???  ASPIRIN 81 MG ORAL TBEC DR TAB  1 TAB PO DAILY  100  0    ???  VITAMIN  D-3 2000 UNIT ORAL TAB  Take 1-1-1/2 tabs po qd  45  3    OBJECTIVE:   Last PCP OV: 12/11/11   LAST DIABETIC RETINAL EXAM (Ordered Date): 05/31/11   Vitals:   BP Readings from Last 3 Encounters:    12/13/11  138/74    12/11/11  130/82    11/29/11  144/80    Labs:   Diabetes 04/21/2011 12/08/2011 02/13/2012   HBA1c  8.2 (H) Pending   Fasting Glucose  154 (H)    Cholesterol  207 (H) 141   Triglyceride  217 (H)    LDL Direct  143 (H) 86   HDL  35 (L) 37 (L)   Creatinine 0.67 0.57    ALT  47 42   Potassium  4.3    Microalbumin/CR  17.0 <30.8 (H)     Immunization History    Administered  Date(s) Administered    ???  INF (Influenza) unspecified formulation  09/24/1991, 08/25/1992, 10/30/2000, 10/17/2001, 10/04/2002, 09/15/2003, 09/08/2004    ???  INF H1N1-09 standard dose (Influenza H1N1-09).  11/03/2008    ???  INFs 11yrs and over (Influenza)  08/28/2008, 08/31/2010    ???  INFs 49yrs and over (influenza)  11/29/2007    ???  INFs pres free 59yrs-adult (Influenza)  09/16/2011    ???  PNUps (Pneumococcal polysaccharide, pneumonia)  10/10/2006    ???  Td 46yrs-adult (Tetanus, diphtheria)  04/07/1993, 10/10/2006      Allergies    Allergen  Reactions    ???  Codeine And Opiate Derivatives  Nausea and/or Vomiting    ???  Kiwi Fruit       Pt. States throat closes up    ???  Lisinopril       Cough    ???  Lovastatin     ???  Pamelor (Nortriptyline)       Severe constipation    ???  Talwin (Pentazocine Lactate)  Nausea and/or Vomiting    ???  Tramadol Hcl  Nausea and/or Vomiting    ???  Vicodin (Acetaminophen-Hydrocodone)       Vomiting     ASSESSMENT   1. DM: A1C goal <7.5%, pt is notcontrolled   2. Hyperlipidemia:LDL goal <100 mg/dl, pt is not controlled   Hypertension: BP goal <140/90 mm HG, pt is controlled   4. A-L-L meds currently taking: ASA yes, statin has not been taking, Lisinopril yes   5. Diabetic retinopathy? Dilated eye exam not needed.   PLAN:   1.Medications: HBA1c- 8.2   SM[facility] :    Fasting am  4/3 145  4/4 191   On 25 units insulin am and 42 units pm.   Agrees top start keeping before dinner readings also. I will call her when HBA1c results come back , labs done 02/13/12. Following process for bariatrics now.  Has re-started simvastatin 40 mg on 12/15/11.  2. Labs: done 02/13/12   3. Follow up by Care Manager after labs   4. Education given to pt: discussed diet and labs and medications

## 2012-02-20 ENCOUNTER — Telehealth

## 2012-02-20 NOTE — Telephone Encounter (Addendum)
Dear Dr. Pricilla Holm,  Your patient has been accepted into our Bariatric Surgery program!  1. Please refer the member to see cardiology and pulmonary medicine for surgery clearance.  This is required for age or BMI of 50 and over.    Almyra Brace RN  Bariatric Case Manager  [facility]  [phone]  [phone]

## 2012-02-20 NOTE — Progress Notes (Addendum)
SUBJECTIVE:   Eileen Barnes, is a 61 year old, female Type 2 DM identified for Diabetes Care management.   Spoke with pt today. Patient denies ADR's and states adherence to medications.   Pt denies hypoglycemic incidents.   Reviewed labs done on 02/13/12. HBA1c -7.8   LDL- 86.  On 25 units insulin am and 42 units pm.  reports that she quit smoking about 18 years ago. She has never used smokeless tobacco.   Current Outpatient Prescriptions    Medication  Sig  Dispense  Refill    ???  FUROSEMIDE 20 MG ORAL TAB  TAKE 1 TO 2 TABLETS BY MOUTH ONCE A DAY PRN FOR FLUID RETENTION  100  2    ???  GLYBURIDE 5 MG ORAL TAB  TAKE 1 TABLET BY MOUTH EVERY MORNING AND TAKE 3 TABLETS EVERY EVENING FOR DIABETES  360  2    ???  METFORMIN 1000 MG ORAL TAB  TAKE 1 TABLET BY MOUTH TWICE DAILY FOR DIABETES  120  3    ???  BD INSULIN SYRINGE ULT-FINE II 1/2 ML 31 X 5/16" MISC SYRINGE  USE AS DIRECTED FOR DIABETES  100  5    ???  SIMVASTATIN 40 MG ORAL TAB  TAKE 1 TABLET BY MOUTH EVERY NIGHT AT BEDTIME  60  5    ???  MECLIZINE 25 MG ORAL TAB  1/2 to 1 tablet three times daily as needed for vertigo  90  0    ???  VANIQA 13.9 % TOP CREA  APPLY TO AFFECTED AREAS TWICE DAILY  30  6    ???  ONE TOUCH ULTRA TEST MISC STRIPS  USE AS DIRECTED BY PHYSICIAN FOR DIABETES  200  6    ???  ONE TOUCH DELICA LANCETS MISC MISC  USE AS DIRECTED BY PHYSICIAN  200  5    ???  SERTRALINE 100 MG ORAL TAB  TAKE 1 TABLET BY MOUTH ONE TIME DAILY  60  3    ???  LOSARTAN 25 MG ORAL TAB  TAKE 1 TABLET BY MOUTH ONE TIME DAILY  93  3    ???  OMEPRAZOLE 20 MG ORAL CPDR SR CAP  TAKE 1 CAPSULE BY MOUTH TWICE DAILY FOR STOMACH  120  2    ???  ATENOLOL 50 MG ORAL TAB  TAKE 1 TABLET BY MOUTH TWICE DAILY FOR BLOOD PRESSURE  180  3    ???  ALLOPURINOL 100 MG ORAL TAB  TAKE 2 TABLETS BY MOUTH ONE TIME DAILY TO PREVENT GOUT  186  3    ???  FLUTICASONE 50 MCG/ACTUATION NASL SPSN  USE 2 SPRAYS IN EACH NOSTRIL ONE TIME DAILY  32  5    ???  QVAR 80 MCG/ACTUATION INHL AERO  USE 2 PUFFS BY MOUTH TWICE DAILY FOR ASTHMA  PREVENTION AND CONTROL  26.1  3    ???  PROAIR HFA 90 MCG/ACTUATION INHL HFAA  USE 2 PUFFS BY MOUTH EVERY 4 TO 6 HOURS AS NEEDED FOR WHEEZING AND COUGH  8.5  2    ???  NOVOLIN N 100 UNIT/ML SUBQ SUSP  INJECT 22 UNITS SUBCUTANEOUSLY EVERY MORNING AND 35 UNITS EVERY EVENING AND MAY TREAT TO TARGET WITH A BLOOD SUGAR GOAL OF 90 TO 130 MG/DL PRIOR TO MEALS FOR DIABETES  20  5    ???  ASPIRIN 81 MG ORAL TBEC DR TAB  1 TAB PO DAILY  100  0    ???  VITAMIN  D-3 2000 UNIT ORAL TAB  Take 1-1-1/2 tabs po qd  45  3    OBJECTIVE:   Last PCP OV:12/11/11   LAST DIABETIC RETINAL EXAM (Ordered Date): 05/31/11   Vitals:   BP Readings from Last 3 Encounters:    12/13/11  138/74    12/11/11  130/82    11/29/11  144/80    Labs:   Diabetes 04/11/2011 04/11/2011 04/21/2011 12/08/2011 02/13/2012   HBA1c 7.8 (H)   8.2 (H) 7.8 (H)   Fasting Glucose    154 (H)    Cholesterol 133   207 (H) 141   Triglyceride    217 (H)    LDL Direct 72   143 (H) 86   HDL 38 (L)   35 (L) 37 (L)   Creatinine 0.60  0.67 0.57    ALT    47 42   Potassium 4.3   4.3    Microalbumin/CR  <31.0 (H)  17.0 <30.8 (H)     Immunization History    Administered  Date(s) Administered    ???  INF (Influenza) unspecified formulation  09/24/1991, 08/25/1992, 10/30/2000, 10/17/2001, 10/04/2002, 09/15/2003, 09/08/2004    ???  INF H1N1-09 standard dose (Influenza H1N1-09).  11/03/2008    ???  INFs 57yrs and over (Influenza)  08/28/2008, 08/31/2010    ???  INFs 58yrs and over (influenza)  11/29/2007    ???  INFs pres free 51yrs-adult (Influenza)  09/16/2011    ???  PNUps (Pneumococcal polysaccharide, pneumonia)  10/10/2006    ???  Td 52yrs-adult (Tetanus, diphtheria)  04/07/1993, 10/10/2006      Allergies    Allergen  Reactions    ???  Codeine And Opiate Derivatives  Nausea and/or Vomiting    ???  Kiwi Fruit       Pt. States throat closes up    ???  Lisinopril       Cough    ???  Lovastatin     ???  Pamelor (Nortriptyline)       Severe constipation    ???  Talwin (Pentazocine Lactate)  Nausea and/or Vomiting    ???  Tramadol Hcl   Nausea and/or Vomiting    ???  Vicodin (Acetaminophen-Hydrocodone)       Vomiting    ASSESSMENT   1. DM: A1C goal <7.5%, pt is notcontrolled   2. Hyperlipidemia:LDL goal <100 mg/dl, pt is not controlled   3. Hypertension: BP goal <140/90 mm HG, pt is controlled   4. A-L-L meds currently taking: ASA yes, statin has not been taking, Lisinopril yes   5. Diabetic retinopathy? Dilated eye exam not needed.   PLAN:   1. Reviewed labs done on 02/13/12. HBA1c -7.8   LDL- 86.  On 25 units insulin am and 42 units pm.  Following process for bariatrics now.  Has re-started simvastatin 40 mg on 12/15/11.  2. Labs: done 02/13/12   3. Follow up by Care Manager as needed.   4. Education given to pt: discussed diet and labs and medications

## 2012-02-26 NOTE — Telephone Encounter (Addendum)
Attempted to reach mbr to schedule a cardiology consult, per referral.  Left message for mbr to contact us.  1st attempt    Anyone in dept can schedule.

## 2012-03-04 ENCOUNTER — Ambulatory Visit

## 2012-03-04 NOTE — Progress Notes (Addendum)
FOLLOW UP    The patient, Monee Dembeck, identity was verified by name and MRN    Those attending session: patient    SYMPTOM SCREENS: Duration: since last visit continues report mood good; denies anxiety or depression; coping well; anger in control; Denies vegetative symptoms; sleep good with Trazodone, tried to wean off but poor sleep so taking regularly; continues Zoloft 100mg  po qd, Trazodone 50mg  po qhs, Denies side effects ":can't believe the difference with the Zoloft ... Paxil wasn't as good for me... Afraid to come off it"    Session content: 62 year old son Madelaine Bhat moved out 2 weeks ago with she and husband loaning him down payment to get own condo; pleased at payment plan set up and he and 32 year old daughter doing well; less drinking and has girlfriend x 2 months who dislikes alcohol; tension with 62 year old daughter's mother who will likely take him to court; "relief" since he is gone and planning activities including gardening and travel with husband; enrolled in bariatric program and has made positive progress with weight loss and health diet.    MENTAL STATUS    Mental Status:appearance: appropriately dressed, appropriately groomed, good hygiene, light make-up, hair styled, affect: congruent with mood and appropriate, attitude: Cooperative, mood: calm, bright, easily verbal, cooperative, personable, speech: appropriate, thought content: no evidence of psychosis, thought process; logical, coherent, orientation: oriented in all spheres, memory: recent:  good and remote:  good, insight: fair, judgment: good, cognitive: intact and motor behavior: normal    RISK ASSESSMENT    Suicide screen: denies current suicidal ideation, plan and intent    Homicide screen: denies homicidal ideation, plan and intent    CLINICAL ASSESSMENT    Clinical assessment: Mood symptoms continue improved and coping well overall with treatment and self intervention.    DIAGNOSIS:    AXIS I:   Diagnosis   1. 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 Client verbalizes understanding and agrees to above plan.    GOALS, ASSIGNMENTS    COUNSELING TIME: 20 minutes    Interventions in session: Gave positive feedback for efforts and progress. Reviewed and reinforced self care strategies and resources including physiological factors that impact mood symptoms and stress discussed and reinforced. Reinforced boundaries of control and responsibility and her role in meeting her needs. Reinforced focus an areas within his/her control - self - versus hoping for change externally to meet his/her needs. Educated about medications including options, risks, and benefits.    Goals: Continue to more effectively cope with mood symptoms and stress.    Assignments: As above. Continue Zoloft 100mg  po qd, Trazodone 50-100mg  po qd prn sleep    Pt verbalizes understanding of and agreement with tx recommendation and plan - yes    NEXT STEPS    Recommended next steps: call as needed; follow up with me 6 months and prn; continue pre-bariatric surgery process
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
## 2012-03-04 NOTE — Telephone Encounter (Addendum)
Your patient was seen in [facility] for mental health screening in regards to Bariatric Surgery.  Based on the initial assessment, there are no contraindications to surgery at this time.

## 2012-03-06 NOTE — Progress Notes (Addendum)
Chief complaint: "Eileen Barnes is being sent to you for consultation for pre-op bariatric surgery clearance"    History of Present illness: Eileen Barnes is a 61 year old female patient with hx of HTN, DM, HL, former smoker, family hx of heart disease - father at age 70, severe obesity being seen for preop evaluation.  Palpitations every day for a few minutes, no lightheadedness or dizziness. Has been having problems with irregular heart beat for 18 -20 years, well controlled with atenolol. Has more episodes if she misses her atenolol. This is irregular. The episodes with the atenolol is more like a skipped beat.  Has pedal edema almost daily.  Denies any complaints of chest pain, shortness of breath, PND, orthopnea,  lightheadedness, dizziness, syncope.   Does not do too much of activity. Agrees she will have shortness of breath with one flight of stairs or with walking 15 minutes.      Past Medical History   Diagnosis Date   ??? SEVERE OBESITY (BMI >= 40) 07/09/2004     MORBID OBESITY   ??? MENOPAUSE 08/16/2004     FEMALE CLIMACTERIC STATE   ??? ACTINIC KERATOSIS. 02/12/2003     ACTINIC KERATOSIS   ??? DM 2, UNCONTROLLED, W DIABETIC POLYNEUROPATHY.    ??? ESSENTIAL HTN    ??? HYPERLIPIDEMIA    ??? GOUT       Past Surgical History   Procedure Date   ??? Lasik         Outpatient Prescriptions Marked as Taking for the 03/06/12 encounter (Office Visit) with Hermenia Bers (M.D.):  TRAZODONE  50 MG ORAL TAB TAKE 1/2 TO 2 TABLETS BY MOUTH EVERY NIGHT AT BEDTIME AS NEEDED FOR SLEEP Disp: 120 Rfl: 3   OMEPRAZOLE 20 MG ORAL CPDR SR CAP TAKE 1 CAPSULE BY MOUTH TWICE DAILY FOR STOMACH Disp: 120 Rfl: 2   ATENOLOL  50 MG ORAL TAB TAKE 1 TABLET BY MOUTH TWICE DAILY FOR BLOOD PRESSURE Disp: 180 Rfl: 3   HUMULIN N 100 UNIT/ML SUBQ SUSP INJECT 24 UNITS SUBCUTANEOUSLY EVERY MORNING AND 35 UNITS EVERY EVENING FOR DIABETES AND MAY TREAT TO TARGET ADJUSTING DOSE AS NEEDED Disp: 20 Rfl: 5   FUROSEMIDE 20 MG ORAL TAB TAKE 1 TO 2 TABLETS BY MOUTH ONCE A   DAY PRN FOR FLUID RETENTION Disp: 100 Rfl: 2   GLYBURIDE 5 MG ORAL TAB TAKE 1 TABLET BY MOUTH EVERY MORNING AND TAKE 3 TABLETS EVERY EVENING FOR DIABETES Disp: 360 Rfl: 2   METFORMIN 1000 MG ORAL TAB TAKE 1 TABLET BY MOUTH TWICE DAILY FOR DIABETES Disp: 120 Rfl: 3   BD INSULIN SYRINGE ULT-FINE II 1/2 ML 31 X 5/16" MISC SYRINGE USE AS DIRECTED FOR DIABETES Disp: 100 Rfl: 5   SIMVASTATIN 40 MG ORAL TAB TAKE 1 TABLET BY MOUTH EVERY NIGHT AT BEDTIME Disp: 60 Rfl: 5   ONE TOUCH ULTRA TEST MISC STRIPS USE AS DIRECTED BY PHYSICIAN FOR DIABETES Disp: 200 Rfl: 6   ONE TOUCH DELICA LANCETS MISC MISC USE AS DIRECTED BY PHYSICIAN Disp: 200 Rfl: 5   SERTRALINE 100 MG ORAL TAB TAKE 1 TABLET BY MOUTH ONE TIME DAILY Disp: 60 Rfl: 3   LOSARTAN  25 MG ORAL TAB TAKE 1 TABLET BY MOUTH ONE TIME DAILY Disp: 93 Rfl: 3   ALLOPURINOL 100 MG ORAL TAB TAKE 2 TABLETS BY MOUTH ONE TIME DAILY TO PREVENT GOUT Disp: 186 Rfl: 3      History   Substance Use  Topics   ??? Smoking status: Former Smoker -- 0.5 packs/day for 20 years     Quit date: 11/13/1993   ??? Smokeless tobacco: Never Used   ??? Alcohol Use: No      Family History   Problem Relation Age of Onset   ??? Other [Other] [OTHER] Mother      63 Years of Alzheimer's Disease and Living  with HTN, Thyroid problems   ??? Diabetes Father      Deceased With Complications, Dialysis patient   ??? Hypothyroidism Paternal Aunt      Hx of Grave's Disease   ??? Other [Other] [OTHER] Paternal Aunt      Neuropathy unrelated to Diabetes   ??? Other [Other] [OTHER] Brother      Overweight with HX of DVT   ??? Breast Cancer Maternal Aunt    ??? Colon Cancer Maternal Aunt      x4   ??? Ovarian Cancer None    ??? Uterine Cancer None    ??? Diabetes Maternal Grandmother    ??? Asthma Son       Review Of Systems:   Skin: negative   Eyes: cataracts and lasik  Ears/Nose/Throat: negative   Respiratory: negative symptoms (no cough, hemoptysis)   Gastrointestinal: negative symptoms (no abdominal pain,n/v, constipation, or diarrhea)    Genitourinary: no urinary symptoms   Neurologic: negative symptoms (no syncope, seizures)   Psychiatric: negative (no anxiety, feelings of depression)   Hematologic/Lymphatic/Immunologic: negative (no anemia, bleeding, bruising)   Endocrine: negative review of symptoms   ?   On Examination:   BP 138/70   Pulse 68   Temp 98.1 ??F (36.7 ??C)   Resp 16   Ht 5\' 7"  (1.702 m)   Wt 277 lb (125.646 kg)   BMI 43.38 kg/m2   Gen: Awake, alert, oriented and not in any acute distress.   Skin: Intact  Eyes: wears glasses  ENMT: No pharyngeal erythema  Neck: Supple  Heart: Normal S1, S2, No Murmurs  JVD not elevated, No Abdomino jugular reflux   Lungs: Clinically clear   Abdomen: Soft, Nontender, Obese  Ext: No edema,  peripheral pulses 2+   Neuro: No focal deficits    Labs:  Stress test 12/2005:  "IMPRESSION:  Reversible ischemia of the mid to distal anterior wall."    EKG 01/2006:  "Diagnosis Line Normal sinus rhythm   Normal ECG" Ala Bent MD.    Component 04/11/2011 04/11/2011 12/08/2011 02/13/2012   CHOL 132 133 207 (H) 141   LDL DIRECT 71 72 143 (H) 86   HGBA1C  7.8 (H) 8.2 (H) 7.8 (H)   HDL 37 (L) 38 (L) 35 (L) 37 (L)   TRIG   217 (H)        Cardiac  Cath 02/08/2006:  Normal coronaries.   Impression/ Plan:   ? 61 year old female patient with hx of HTN, DM, HL, former smoker, family hxof heart disease - father at age 27, severe obesity being seen for preop evaluation.  Normal coronaries by cath 01/2006 done after an abnormal stress test - adenosine nuclear. Patient says she had a severe reaction to the injections and is not interested in any stress test which would involve injections.  She is probably low to intermediate risk for an intermediate risk surgery, but would be hard to state with confidence due to her limited functional capacity.  Plan:   Check exercise stress echo  Palpitations: Check holter monitor.  HTN and diabetes: Goal BP <  130/80  Increase losartan to 50 mg daily.  Obesity: Counseled re diet and exercise.     RTC  55m.  CC PCP    ?   Thank you for allowing me to participate in the care of this patient.   Please feel free to contact me with any questions or concerns.    Dasani Crear L Courtany Mcmurphy MD

## 2012-03-06 NOTE — Patient Instructions (Addendum)
Do you understand your plan of care today? Is there anything else I can help you with today? You may be getting a survey in the mail regarding your care today. If you do please take a few minutes to fill it out and mail it back to us. It would be greatly appreciated.

## 2012-03-11 NOTE — Progress Notes (Addendum)
The patient, Eileen Barnes's, identity was verified by name and MRN.  24 hr holter monitor has been applied   Results will be available in [employer] [facility]

## 2012-03-14 NOTE — Progress Notes (Addendum)
The patient, Eileen Barnes's, identity was verified by name.   Visit #2  Pt attended Bariatric Weight Loss Class in preparation for bariatric surgery. Discussed function of protein in the diet, protein content of foods and educated on how to determine amountof protein in foods. Discussed importance of keeping food diary before and after surgery, discussed importance of reducing sugar in the diet prior to surgery to aid with weight loss and to prepare for post-surgery diet, drinking with meals, weight loss expectations, vitamins and minerals after surgery, beverages, reviewed diet progression after bariatric surgery.   HOs given: How to Get Your Protein.   Wt Readings from Last 3 Encounters:   03/14/12 272 lb (123.378 kg)   03/06/12 277 lb (125.646 kg)   02/15/12 283 lb 12.8 oz (128.731 kg)     Weight Goal: 270 lbs   Food Diary: Y  Pt has lost 9 lbs over 1 month.  F/U: Bariatric Weight Loss Class on 04/18/2012.   Eileen Cerros L. Dayton Martes MS., RD., LD

## 2012-04-09 NOTE — Patient Instructions (Addendum)
Thank you for allowing [place] to provide your care today. We hope all your concerns were met at this visit.    You may receive a brief survey in the mail within the next few weeks concerning this office visit. Your feedback is an opportunity to comment on the service experience with your care team.  Thank you for taking your time to complete and return the survey.  Your feedback is valuable to [place].    Keith Rake, CMA

## 2012-04-10 NOTE — Progress Notes (Addendum)
Chief Complaint   Patient presents with   ??? DIABETES CARE MANAGEMENT     PT here for DM f/u and establish PCP.           SUBJECTIVE:  61 year old female for follow up of diabetes. Diabetic Review of Systems - medication compliance:  compliant all of the time, diabetic diet compliance:  compliant all of the time, home glucose monitoring: is performed regularly.  Other symptoms and concerns: none.    Current Outpatient Prescriptions   Medication Sig Dispense Refill   ??? LOSARTAN  50 MG ORAL TAB TAKE 1 TABLET BY MOUTH ONE TIME DAILY  90  3   ??? TRAZODONE  50 MG ORAL TAB TAKE 1/2 TO 2 TABLETS BY MOUTH EVERY NIGHT AT BEDTIME AS NEEDED FOR SLEEP  120  3   ??? OMEPRAZOLE 20 MG ORAL CPDR SR CAP TAKE 1 CAPSULE BY MOUTH TWICE DAILY FOR STOMACH  120  2   ??? ATENOLOL  50 MG ORAL TAB TAKE 1 TABLET BY MOUTH TWICE DAILY FOR BLOOD PRESSURE  180  3   ??? HUMULIN N 100 UNIT/ML SUBQ SUSP INJECT 24 UNITS SUBCUTANEOUSLY EVERY MORNING AND 35 UNITS EVERY EVENING FOR DIABETES AND MAY TREAT TO TARGET ADJUSTING DOSE AS NEEDED  20  5   ??? FUROSEMIDE 20 MG ORAL TAB TAKE 1 TO 2 TABLETS BY MOUTH ONCE A  DAY PRN FOR FLUID RETENTION  100  2   ??? GLYBURIDE 5 MG ORAL TAB TAKE 1 TABLET BY MOUTH EVERY MORNING AND TAKE 3 TABLETS EVERY EVENING FOR DIABETES  360  2   ??? METFORMIN 1000 MG ORAL TAB TAKE 1 TABLET BY MOUTH TWICE DAILY FOR DIABETES  120  3   ??? BD INSULIN SYRINGE ULT-FINE II 1/2 ML 31 X 5/16" MISC SYRINGE USE AS DIRECTED FOR DIABETES  100  5   ??? SIMVASTATIN 40 MG ORAL TAB TAKE 1 TABLET BY MOUTH EVERY NIGHT AT BEDTIME  60  5   ??? MECLIZINE 25 MG ORAL TAB 1/2 to 1 tablet three times daily as needed for vertigo  90  0   ??? VANIQA 13.9 % TOP CREA APPLY TO AFFECTED AREAS TWICE DAILY  30  6   ??? ONE TOUCH ULTRA TEST MISC STRIPS USE AS DIRECTED BY PHYSICIAN FOR DIABETES  200  6   ??? ONE TOUCH DELICA LANCETS MISC MISC USE AS DIRECTED BY PHYSICIAN  200  5   ??? SERTRALINE 100 MG ORAL TAB TAKE 1 TABLET BY MOUTH ONE TIME DAILY  60  3   ??? ALLOPURINOL 100 MG ORAL TAB TAKE  2 TABLETS BY MOUTH ONE TIME DAILY TO PREVENT GOUT  186  3   ??? FLUTICASONE  50 MCG/ACTUATION NASL SPSN USE 2 SPRAYS IN EACH NOSTRIL ONE TIME DAILY  32  5   ??? ASPIRIN   81 MG ORAL TBEC DR TAB 1 TAB PO DAILY  100  0   ??? DISCONTD: LOSARTAN  25 MG ORAL TAB TAKE 1 TABLET BY MOUTH ONE TIME DAILY  93  3   ??? VITAMIN D-3 2000 UNIT ORAL TAB Take 1-1-1/2 tabs po qd  45  3       OBJECTIVE:  Appearance: alert, well appearing, and in no distress.  BP 124/68   Pulse 72   Temp(Src) 97.7 ??F (36.5 ??C) (Oral)   Resp 16   Ht 5\' 7"  (1.702 m)   Wt 273 lb (123.832 kg)   BMI 42.76  kg/m2    Exam: heart sounds normal rate, regular rhythm, normal S1, S2, no murmurs, rubs, clicks or gallops, chest clear, no hepatosplenomegaly, no carotid bruits, feet: warm, good capillary refill  HGBA1C   Date Value Range Status   02/13/2012 7.8* 3.4 to 5.9 (%) Final      (NOTE)      All Ages:       A1c Value   Interpretation       < 5.7%    Non-diabetic       5.7-6.4%   Increased risk for DM       > or = 6.5%   Consistent with DM      07/19/11      Analysis Performed by [facility] Reference laboratory: [id number] [place] [address]     ASSESSMENT:  Diabetes Mellitus: improved    PLAN:  See orders for this visit as documented in the electronic medical record.  Issues reviewed with her: referral to Diabetic Education department and glycohemoglobin and other lab monitoring discussed.

## 2012-04-11 NOTE — Telephone Encounter (Addendum)
req July fu appt    Chief Complaint: fu treadmill/echo     With Provider: Dr. Ferd Glassing    Preferred Days:  After June 26th    Preferred Time:  any    Preferred Location: [facility]    Preferred Appointment: Based on template review appointment is being forwarded to the department forscheduling assistance    Contact Phone Number: [phone]   mbr informed of 24 to 48 hour turn around time for messages

## 2012-04-11 NOTE — Telephone Encounter (Addendum)
appt confirmed

## 2012-04-11 NOTE — Patient Instructions (Addendum)
Do you understand your plan of care today? Is there anything else I can help you with today? You may be getting a survey in the mail regarding your care today. If you do please take a few minutes to fill it out and mail it back to us. It would be greatly appreciated.

## 2012-04-11 NOTE — Progress Notes (Addendum)
The patient, Eileen Barnes, identity was verified by name    SUBJECTIVE:  61 year old,  Chief Complaint   Patient presents with   ??? CONSULTATION     "Eileen Barnes is being sent to you for consultation for pre-op bariatric surgery clearance."  Referred by Dardir, Cathlean Sauer (M.D.), MEDICAL DOCTOR    The patient has a history of obesity, DM, hypertension, depression   She denied childhood asthma or lung disease. She smoked 1/2 ppd x 20 yrs quitting 20 yrs ago.  She was seen in past for cough an shortness of breath. Was taking MDI at the time. Once she was taken off the ACE inhibitor last summer her cough resolved. She no longer complains of shortness of breath. She has not used any MDI since. She also started omeprazole and reflux symptoms resolved.  She is now in bariatric program.  She complains of frequent waking if sleeping on back but not on her side. She takes 1 hour nap every day at 3 PM and stays up until 10 PM. If she misses her nap she goes to bed by 9 PM.  Has family history of narcolepsy and her son is on cpap for obstructive sleep apnea.    Review of Systems - History obtained from the patient  General ROS: positive for  - sleep disturbance  negative for - chills or fever  ENT ROS: negative  Respiratory ROS: negative for - cough, pleuritic pain, shortness of breath or wheezing  Cardiovascular ROS: positive for - palpitations  negative for - chest pain, edema, orthopnea or paroxysmal nocturnal dyspnea  Gastrointestinal ROS: positive for - heartburn  negative for - abdominal pain or swallowing difficulty/pain    Family History   Problem Relation Age of Onset   ??? Other [Other] [OTHER] Mother      21 Years of Alzheimer's Disease and Living  with HTN, Thyroid problems   ??? Diabetes Father      Deceased WithComplications, Dialysis patient   ??? Hypothyroidism Paternal Aunt      Hx of Grave's Disease   ??? Other [Other] [OTHER] Paternal Aunt      Neuropathy unrelated to Diabetes   ??? Other [Other] [OTHER] Brother       Overweight with HX of DVT   ??? Breast Cancer Maternal Aunt    ??? Colon Cancer Maternal Aunt      x4   ??? Ovarian Cancer None    ??? Uterine Cancer None    ??? Diabetes Maternal Grandmother    ??? Asthma Son    ??? narcopelsy [Other] [OTHER] Brother    ??? narcolepsy [Other] [OTHER] Maternal Grandmother    ??? narcolepsy [Other] [OTHER] Maternal Uncle    ??? Alzheimers Disease Maternal Uncle          History     Social History   ??? Marital Status: Unknown     Spouse Name: Married     Number of Children: N/A   ??? Years of Education: N/A     Occupational History   ??? 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 Smoking status: Former Smoker -- 0.5 packs/day for 20 years     Quit date: 11/13/1993   ??? Smokeless tobacco: Never Used   ??? Alcohol Use: No   ??? Drug Use: No   ??? Sexually Active: Not Currently -- Female partner(s)      ascus (=) HPV with  cryo     Other Topics Concern   ??? Not on file     Social History Narrative   ??? No narrative on file     Current Outpatient Prescriptions on File Prior to Visit   Medication Sig Dispense Refill   ??? LOSARTAN  50 MG ORAL TAB TAKE 1 TABLET BY MOUTH ONE TIME DAILY  90  3   ??? OMEPRAZOLE 20 MG ORAL CPDR SR CAP TAKE 1 CAPSULE BY MOUTH TWICE DAILY FOR STOMACH  120  2   ??? ATENOLOL  50 MG ORAL TAB TAKE 1 TABLET BY MOUTH TWICE DAILY FOR BLOOD PRESSURE  180  3   ??? HUMULIN N 100 UNIT/ML SUBQ SUSP INJECT 24 UNITS SUBCUTANEOUSLY EVERY MORNING AND 35 UNITS EVERY EVENING FOR DIABETES AND MAY TREAT TO TARGET ADJUSTING DOSE AS NEEDED  20  5   ??? FUROSEMIDE 20 MG ORAL TAB TAKE 1 TO 2 TABLETS BY MOUTH ONCE A  DAY PRN FOR FLUID RETENTION  100  2   ??? GLYBURIDE 5 MG ORAL TAB TAKE 1 TABLET BY MOUTH EVERY MORNING AND TAKE 3 TABLETS EVERY EVENING FOR DIABETES  360  2   ??? METFORMIN 1000 MG ORAL TAB TAKE 1 TABLET BY MOUTH TWICE DAILY FOR DIABETES  120  3   ??? SIMVASTATIN 40 MG ORAL TAB TAKE 1 TABLET BY MOUTH EVERY NIGHT AT BEDTIME  60  5   ??? SERTRALINE 100 MG ORAL TAB TAKE 1 TABLET BY MOUTH ONE  TIME DAILY  60  3   ??? ALLOPURINOL 100 MG ORAL TAB TAKE 2 TABLETS BY MOUTH ONE TIME DAILY TO PREVENT GOUT  186  3   ??? ASPIRIN   81 MG ORAL TBEC DR TAB 1 TAB PO DAILY  100  0   ??? TRAZODONE  50 MG ORAL TAB TAKE 1/2 TO 2 TABLETS BY MOUTH EVERY NIGHT AT BEDTIME AS NEEDED FOR SLEEP  120  3   ??? BD INSULIN SYRINGE ULT-FINE II 1/2 ML 31 X 5/16" MISC SYRINGE USE AS DIRECTED FOR DIABETES  100  5   ??? MECLIZINE 25 MG ORAL TAB 1/2 to 1 tablet three times daily as needed for vertigo  90  0   ??? VANIQA 13.9 % TOP CREA APPLY TO AFFECTED AREAS TWICE DAILY  30  6   ??? ONE TOUCH ULTRA TEST MISC STRIPS USE AS DIRECTED BY PHYSICIAN FOR DIABETES  200  6   ??? ONE TOUCH DELICA LANCETS MISC MISC USE AS DIRECTED BY PHYSICIAN  200  5   ??? DISCONTD: LOSARTAN  25 MG ORAL TAB TAKE 1 TABLET BY MOUTH ONE TIME DAILY  93  3   ??? FLUTICASONE  50 MCG/ACTUATION NASL SPSN USE 2 SPRAYS IN EACH NOSTRIL ONE TIME DAILY  32  5   ??? VITAMIN D-3 2000 UNIT ORAL TAB Take 1-1-1/2 tabs po qd  45  3       OBJECTIVE:  BP 128/70   Pulse 71   Temp 98.3 ??F (36.8 ??C)   Resp 16   Ht 5\' 7"  (1.702 m)   Wt 277 lb (125.646 kg)   BMI 43.38 kg/m2   SpO2 97% on RA  Wt Readings from Last 3 Encounters:   04/11/12 277 lb (125.646 kg)   04/09/12 273 lb (123.832 kg)   03/14/12 272 lb (123.378 kg)   no apparent distress  Obese  Throat exam normal. Oral cavity, tongue, pharynx and palate have no inflammation or  suspicious lesions. Tonsils - tonsils are absent. Teeth normal without tenderness. mallampati class 1  Neck: no palpable masses, 15.5 inches  Chest is clear, no wheezing or rales. Normal symmetric air entry throughout both lung fields. No chest wall deformities or tenderness.  S1 and S2 normal, no murmurs, clicks, gallops or rubs. Regular rate and rhythm.   Extremities:  no pedal edema, no clubbing or cyanosis.    epworth sleepiness score 4    DATE: 05/09/11   PFT: FEV1: 2.42/91%   FVC: 3.28/99%   FEV1/FVC: 74%   FEF 25-75%: 1.78/63%   TLC : 7.34/134%   DLCO: 18.3/64%   DLCO/VA:  4.87/130%      ASSESSMENT/PLAN:    EXAMINATION FOR PREOP RESPIRATORY EVALUATION  (primary encounter diagnosis)  Note: she is former smoker. No respiratory symptoms since quitting ACE inhibitor. Will recheck pulmonary function tests and get sleep test.  Plan: REFERRAL SLEEP STUDY, REFERRAL PULMONARY         FUNCTION TEST        Limited pft    SEVERE OBESITY (BMI >= 40)  Note: continue wt loss .  Sleep apnea discussed and reviewed testing for dx, treatment options,  wt. loss and CPAP.   Plan: REFERRAL SLEEP STUDY            Will communicate the consult note back to referring provider or primary provider via the  shared electronic medical record.    FOLLOW-UP: based on test results  Will complete pre-op note post test

## 2012-04-22 NOTE — Progress Notes (Addendum)
The patient, Eileen Barnes's, identity was verified by name.    Bariatric Obesity Program Visit #3    Pt attended Bariatric Weight Loss Class in preparation for bariatric surgery. Discussed function ofprotein in the diet, protein content of foods. Discussed importance of keeping food diary before and after surgery, discussed importance of reducing sugar and fatty foods in the diet prior to surgery to aid with weight loss and to prepare for post-surgery diet, drinking with meals. Reviewed Baiiatric quiz in detail. Discussed vitamins and minerals pre and post bariatric surgery.    PA reported: bike-15-30 min daily, water aerobics2x/week-60 min, gardening 2x/week-60 min    Wt Readings from Last 3 Encounters:   04/22/12 265 lb 12.8 oz (120.566 kg)   04/11/12 277 lb (125.646 kg)   04/09/12 273 lb (123.832 kg)       Weight Goal: 270 lbs    Food Diary: Y    Pt has lost 11 lbs over one month    F/U: Bariatric Weight Loss Class on 05/23/2012    Sheylin Scharnhorst L. Dayton Martes MS., RD., LD

## 2012-04-30 NOTE — Telephone Encounter (Addendum)
Spoke with member and appt for sleep study scheduled for 06/24/12 @ 9PM.  Tish Men, CLPN

## 2012-05-06 ENCOUNTER — Encounter

## 2012-05-08 ENCOUNTER — Encounter

## 2012-05-09 NOTE — Patient Instructions (Addendum)
Do you understand your plan of care today? Is there anything else I can help you with today? You may be getting a survey in the mail regarding your care today. If you do please take a few minutes to fill it out and mail it back to us. It would be greatly appreciated.

## 2012-05-09 NOTE — Progress Notes (Addendum)
Chief complaint: "Eileen Barnes is being sent to you for consultation for pre-op bariatric surgery clearance"    History of Present illness: Eileen Barnes is a 61 year old female patient with hx of HTN, DM, HL, former smoker, family hx of heart disease - father at age 31, severe obesity being seen for preop evaluation.  Last visit: "Palpitations every day for a few minutes, no lightheadedness or dizziness. Has been having problems with irregular heart beat for 18 -20 years, well controlled with atenolol. Has more episodes if she misses her atenolol. This is irregular. The episodes with the atenolol is more like a skipped beat.  Has pedal edema almost daily.  Denies any complaints of chest pain, shortness of breath, PND, orthopnea,  lightheadedness, dizziness, syncope.   Does not do too much of activity. Agrees she will have shortness of breath with one flight of stairs or with walking 15 minutes. "     Subsequently had a   Holter 03/11/2012 -  NSR, rare SVPBs, very rare VE.  Exercise stress echo - 05/08/2012:  Normal echo.  Normal stress echo.    Now more active. Lost 15 lbs, eating healthy and working out. No limitations.  Palpitations have decreased.       Past Medical History   Diagnosis Date   ??? SEVERE OBESITY (BMI >= 40) 07/09/2004     MORBID OBESITY   ??? MENOPAUSE 08/16/2004     FEMALE CLIMACTERIC STATE   ??? ACTINIC KERATOSIS. 02/12/2003     ACTINIC KERATOSIS   ??? DM 2, UNCONTROLLED, W DIABETIC POLYNEUROPATHY.    ??? ESSENTIAL HTN    ??? HYPERLIPIDEMIA    ??? GOUT          Outpatient Prescriptions Marked as Taking for the 05/09/12 encounter (Office Visit) with Hermenia Bers (M.D.):  SERTRALINE 100 MG ORAL TAB TAKE 1 TABLET BY MOUTH ONE TIME DAILY Disp: 60 Rfl: 0   ALLOPURINOL 100 MG ORAL TAB TAKE 2 TABLETS BY MOUTH ONE TIME DAILY TO PREVENT GOUT Disp: 186 Rfl: 3   LOSARTAN  50 MG ORAL TAB TAKE 1 TABLET BY MOUTH ONE TIME DAILY Disp: 90 Rfl: 3   TRAZODONE  50 MG ORAL TAB TAKE 1/2 TO 2 TABLETS BY MOUTH EVERY NIGHT AT BEDTIME  AS NEEDED FOR SLEEP Disp: 120 Rfl: 3   OMEPRAZOLE 20 MG ORAL CPDR SR CAP TAKE 1 CAPSULE BY MOUTH TWICE DAILY FOR STOMACH Disp: 120 Rfl: 2   ATENOLOL  50 MG ORAL TAB TAKE 1 TABLET BY MOUTH TWICE DAILY FOR BLOOD PRESSURE Disp: 180 Rfl: 3   HUMULIN N 100 UNIT/ML SUBQ SUSP INJECT 24 UNITS SUBCUTANEOUSLY EVERY MORNING AND 35 UNITS EVERY EVENING FOR DIABETES AND MAY TREAT TO [employer] ADJUSTING DOSE AS NEEDED Disp: 20 Rfl: 5   FUROSEMIDE 20 MG ORAL TAB TAKE 1 TO 2 TABLETS BY MOUTH ONCE A  DAY PRN FOR FLUID RETENTION Disp: 100 Rfl: 2   GLYBURIDE 5 MG ORAL TAB TAKE 1 TABLET BY MOUTH EVERY MORNING AND TAKE 3 TABLETS EVERY EVENING FOR DIABETES Disp: 360 Rfl: 2   METFORMIN 1000 MG ORAL TAB TAKE 1 TABLET BY MOUTH TWICE DAILY FOR DIABETES Disp: 120 Rfl: 3   BD INSULIN SYRINGE ULT-FINE II 1/2 ML 31 X 5/16" MISC SYRINGE USE AS DIRECTED FOR DIABETES Disp: 100 Rfl: 5   SIMVASTATIN 40 MG ORAL TAB TAKE 1 TABLET BY MOUTH EVERY NIGHT AT BEDTIME Disp: 60 Rfl: 5   ONE TOUCH ULTRA TEST  MISC STRIPS USE AS DIRECTED BY PHYSICIAN FOR DIABETES Disp: 200 Rfl: 6   ONE TOUCH DELICA LANCETS MISC MISC USE AS DIRECTED BY PHYSICIAN Disp: 200 Rfl: 5   ASPIRIN   81 MG ORAL TBEC DR TAB 1 TAB PO DAILY Disp: 100 Rfl: 0            Review Of Systems:   Skin: negative   Eyes: cataracts and lasik  Ears/Nose/Throat: negative   Respiratory: negative symptoms (no cough, hemoptysis)   Gastrointestinal: negative symptoms (no abdominal pain,n/v, constipation, or diarrhea)   Genitourinary: no urinary symptoms   Neurologic: negative symptoms (no syncope, seizures)   Psychiatric: negative (no anxiety, feelings of depression)   Hematologic/Lymphatic/Immunologic: negative (no anemia, bleeding, bruising)   Endocrine: negative review of symptoms   ?   On Examination:   BP 134/66   Pulse 69   Temp 98.6 ??F (37 ??C)   Resp 16   Ht 5\' 7"  (1.702 m)   Wt 268 lb (121.564 kg)   BMI 41.97 kg/m2   Gen: Awake, alert, oriented and not in any acute distress.   Skin: Intact  Eyes: wears  glasses  ENMT: No pharyngeal erythema  Neck: Supple  Heart: Normal S1, S2, No Murmurs  JVD not elevated, No Abdomino jugular reflux   Lungs: Clinically clear   Abdomen: Soft, Nontender, Obese  Ext: No edema,  peripheral pulses 2+   Neuro: No focal deficits    Labs:  Stress test 12/2005:  "IMPRESSION:  Reversible ischemia of the mid to distal anterior wall."    EKG 01/2006:  "Diagnosis Line Normal sinus rhythm   Normal ECG" Ala Bent MD.    Component 04/11/2011 04/11/2011 12/08/2011 02/13/2012   CHOL 132 133 207 (H) 141   LDL DIRECT 71 72 143 (H) 86   HGBA1C  7.8 (H) 8.2 (H) 7.8 (H)   HDL 37 (L) 38 (L) 35 (L) 37 (L)   TRIG   217 (H)        Cardiac  Cath 02/08/2006:  Normal coronaries.     Exercise stress echo 04/2012:  "Summary  RESTING ECHO:  Technically difficult study with limited visualization.  Normal left ventricular size and function. The estimated left ventricular  ejection fraction is 60%. Subtle focal wall motion abnormalities cannot be  excluded due to limited image quality.   No hemodynamically significant valve disease.  STRESS ECHO:  Patient exercised for 5:04 minutes and achieved 148 BPM which represents 93%  of her age predicted maximum heart rate and a workload of 6.10 METS.  Post stress the LV cavity size decreases.   All wall segments increase in contractility.   No new wall motion abnormalities are seen.  Impression:   Stress echo is negative for exercise induced ischemia at 93% of age  predicted maximal heart rate and 6.10 METS of work.  EKGs are not available for review. Please review separate EKG report.  Electronically signed by Hermenia Bers MD(Interpreting physician) on  05/08/2012"    Impression/ Plan:   ? 61 year old female patient with hx of HTN, DM, HL, former smoker, family hx of heart disease - father at age 66, severeobesity being seen for preop evaluation.  Normal coronaries by cath 01/2006 done after an abnormal stress test - adenosine nuclear.   Since her last visit had a   Holter 03/11/2012  - NSR, rare SVPBs, very rare VE.  Exercise stress echo - 05/08/2012:Normal echo.Normal stress echo.    Preop eval: Low  risk patient for intermediate risk surgery. No further workup or med changes needed.    Palpitations: No ectopy on holter. Dont bother her too much. Can increase atenolol in the future if needed.  HTN and diabetes: Goal BP <130/80. Reasonable. Continue present meds.  Obesity: Counseled re diet and exercise.     Return to cardiology as needed.  CC PCP    ?   Thank you for allowing me to participate in the care of this patient.   Please feel free to contact me with any questions or concerns.    Abena Erdman L Doyne Micke MD

## 2012-05-13 ENCOUNTER — Encounter

## 2012-05-22 MED ORDER — ALBUTEROL SULFATE (2.5 MG/3ML) 0.083% IN NEBU
RESPIRATORY_TRACT | Status: AC
Start: 2012-05-22 — End: 2014-09-16

## 2012-05-22 NOTE — Progress Notes (Addendum)
The patient, Eileen Barnes, identity was verified by Name, MRN and Birthdate.  Limited Pulmonary Function Test completed with bronchodilator on 05/22/2012. Patient tolerated procedure well.    Arlee Muslim   BS,RRT

## 2012-05-27 NOTE — Progress Notes (Addendum)
The patient, Eileen Barnes's, identity was verified by name.  Bariatric Obesity Program Visit #4  Pt attended Bariatric Weight Loss Class/Support Group in preparation for bariatric surgery. Discussed function of protein in the diet, protein content of foods.Discussed importance of keeping food diary before and after surgery, discussed importance of reducing sugar and fatty foods in the diet prior to surgery to aid with weight loss and to prepare for post-surgery diet, drinking with meals. Discussed vitamins and minerals pre and post bariatric surgery.   Member attended Bariatric Support Group.   Panel included:Dietitian   Discussion included emphasis on lifelong and permanent changes such as correct eating behaviors, protein recommendations, vitamin supplementation.   Social aspects of Bariatric Surgery - self image, peer pressure and taking care of one's needs - were examined by members.   Wt Readings from Last 3 Encounters:   05/27/12 269 lb 12.8 oz (122.38 kg)   05/09/12 268 lb (121.564 kg)   05/08/12 265 lb (120.203 kg)     PA reported: swim 2x.week, bike every other day, gardening 2x/week  Weight Goal: 270lbs   F/U: Bariatric Weight Loss Class on 06/13/2012  Savaughn Karwowski L. Dayton Martes MS., RD., LD

## 2012-05-28 ENCOUNTER — Encounter: Payer: Self-pay | Admitting: Internal Medicine

## 2012-05-30 NOTE — Telephone Encounter (Addendum)
DATE: 05/22/12  PFT: FEV1: 2.32/88%  FVC: 3.15/95%  FEV1/FVC: 74%  FEF 25-75% :  1.65/59%    DATE: 05/09/11   PFT: FEV1: 2.42/91%   FVC: 3.28/99%   FEV1/FVC: 74%   FEF 25-75%: 1.78/63%   TLC : 7.34/134%   DLCO: 18.3/64%   DLCO/VA: 4.87/130%    No change. Sleep test pending.

## 2012-06-13 NOTE — Progress Notes (Addendum)
The patient, Eileen Barnes's, identity was verified by name.    Bariatric Obesity Program Visit #5    Pt attended Bariatric Weight Loss Class in preparation for bariatric surgery. Discussed function ofprotein in the diet, protein content of  foods and educated on how to determine amount of protein in foods. Discussed importance of keeping food diary before and after surgery, discussed importance of reducing sugar in the diet prior to surgery to aid with weight loss and to prepare for post-surgery diet, drinking with meals, and reviewed diet progression after bariatric surgery.    PA reported: water aerobics 3x/week, 1 hr, treadmill 4x/week, up to 25 min, bike 2x/week 15-20 min    Wt Readings from Last 3 Encounters:   06/13/12 264 lb 9.6 oz (120.022 kg)   05/27/12 269 lb 12.8 oz (122.38 kg)   05/09/12 268 lb (121.564 kg)       Weight Goal: 270, met  Food Diary: Y         F/U: Bariatric Weight Loss Class on 06/17/2012.Marland Kitchen    Miette Molenda L. Dayton Martes MS., RD., LD

## 2012-06-18 ENCOUNTER — Telehealth

## 2012-06-18 NOTE — Telephone Encounter (Addendum)
Received a verbal order for colon prep gavilyte rx, possible glucoscan and to start IV with 0.09 %NS at [facility] the day of the procedure from Dr Chipper Herb .    Order repeated back to Dr Chipper Herb  and order confirmed.  Diagnosis to be associated with order h/o polyp, diagnosis confirmed Gavilyte disp. # 1 take as directed. Dr. Chipper Herb . No refills.

## 2012-06-18 NOTE — Telephone Encounter (Addendum)
Appointment for colonoscopy scheduled with  Dr. Chipper Herb at [facility].    Order IV start and gavilyte, glucoscan if appropriate.  Appointment date and time confirmed with patient. Reviewed printed instructions with patient, instructions were mailed. All questions were answered.   NI:[id number]

## 2012-06-21 ENCOUNTER — Telehealth

## 2012-06-21 NOTE — Telephone Encounter (Addendum)
Metformin reordered may phone in R X& let patient know when done - C. Baili Stang CRNP

## 2012-06-21 NOTE — Telephone Encounter (Addendum)
Spoke with mbr. And advised that message received and will be addressed within 24-48 hours. Member states that is fine. Will not be out of her medication for awhile.

## 2012-06-21 NOTE — Telephone Encounter (Addendum)
pt is requesting refill on below medication and pt also instructed to call pharmacy before pickup.    Pharmacy: [facility]   pharmacy phone  [phone]    medication     : SMORAG, CAROL L Medication:METFORMIN 1000 MG ORAL TAB Qty:120 Ref:3/3 Ordered:10/23/2011 End:10/22/2012 Route: Class:Phone In NUU:VOZD 1 TABLET BY MOUTH TWICE DAILY FOR DIABETES Comment:GIANT EAGLE , BEREA        The doctor has 24 to 48 hours to respond to your message

## 2012-06-21 NOTE — Telephone Encounter (Addendum)
Prescription called to [facility] pharmacy. Patient was instructed to check with her pharmacy for prescription availability.

## 2012-06-28 NOTE — Progress Notes (Addendum)
The patient, Eileen Barnes's, identity was verified by name, MRN and address.    Patient arrived at 8:53 PM. Study started at 10:27PM at approximate normal bedtime. Consent form obtained.  Mild snoring was observed. The Patient Apnea/Hypopnea index was 3.4 per hour. The Patient achieved  all stages of sleep. The patient spent the study on either side and supine. Sleep Study completed. Patient discharged.

## 2012-06-28 NOTE — Progress Notes (Addendum)
Sleep history: The patient [age] year [age] female with snoring. The study started at 22:26:12 and ended at 05:33:38. Total sleep time was 333 minutes resulting in a sleep efficiency of 79%. There were 10 awakenings with a total time awake after sleep onset of 50 minutes. The sleep latency was 39 minutes and the REM latency was 162 minutes. The patient spent 50.2% of sleep time in the supine position. The sleep stage percentages were 5.7% stage 1, 49.0% stage 2, 30.0% stages 3 & 4 and 15.3% REM sleep. There were 16 arousals, resulting in an arousal index of 2.9. There were 42 stage shifts. Light snoring was noted. There were 19 respiratory events consisting of 0 apneas (0 obstructive, 0 central, 0 mixed) and 19 hypopneas. The apnea-hypopnea index was 3.4. The mean oxygen saturation during the study was 92.0%, with a minimum oxygen saturation of 71.0%. The patient spent 16.1% of sleep time with an oxygen saturation below 90%. The PLM index is 0.  ICSD DIAGNOSIS:  Snoring  IMPRESSION:  The overal AHI and PLM indices are within normal limits. There is no evidence of obstructive sleep apnea or periodic limb movement disorder.   INTERPRETING PHYSICIAN:  Dr. Jenene Slicker  Diplomate ABPN, certified in sleep medicine.

## 2012-07-11 ENCOUNTER — Encounter

## 2012-07-17 ENCOUNTER — Telehealth

## 2012-07-17 NOTE — Telephone Encounter (Addendum)
Dr  Katrinka Blazing: mbr calling for results of Sleep Study 06/24/12 pls call [phone], thank you     mbr informed of 24 to 48 hour turn around time for messages

## 2012-07-17 NOTE — Telephone Encounter (Addendum)
REVIEW OF SLEEP STUDY:  Date of sleep study: 06/24/12  Type of Study: In-Lab PSG  Apnea/Hypopnea Indes (AHI): 3.4/hour  (<5 WNL, >40 severe)  Worse in: REM and AHI 22  With nocturnal oximetry:  O2 Saturation: Average: 92%, Low: 71%, 16 % of the ime < 90%        DATE: 05/22/12   PFT: FEV1: 2.32/88%   FVC: 3.15/95%   FEV1/FVC: 74%   FEF 25-75% : 1.65/59%   DATE: 05/09/11   PFT: FEV1: 2.42/91%   FVC: 3.28/99%   FEV1/FVC: 74%   FEF 25-75%: 1.78/63%   TLC : 7.34/134%   DLCO: 18.3/64%   DLCO/VA: 4.87/130%      Has REM related  Sleep apnea with desaturations  Spoke to her and discussed ordering cpap at 8 cpap. with wt loss she should not need cpap lifelong.  Once on cpap she should use in post-op period.

## 2012-07-22 ENCOUNTER — Encounter

## 2012-07-24 NOTE — Progress Notes (Addendum)
The patient, Eileen Barnes's, identity was verified by name.    Bariatric Obesity Program Visit #6    Change in medical history:  Had sleep study, states will be getting a CPAP    Supplements/herbs/alternative therapy: 2,000 IU D3, B12, MV gummy    Wt Readings from Last 3 Encounters:   07/24/12 268 lb (121.564 kg)   06/13/12 264 lb 9.6 oz (120.022 kg)   05/27/12 269 lb 12.8 oz (122.38 kg)     GLUCOSE, RANDOM   Date Value Range Status   04/11/2011 165* 80-115 (MG/DL) Final   16/08/9603 540* 80-115 (MG/DL) Final   98/11/1912 782* 80-115 (MG/DL) Final        GLUCOSE, FASTING   Date Value Range Status   12/08/2011 154* 70-110 (MG/DL) Final   9/56/2130 865* 70-110 (MG/DL) Final   05/21/4695 Pending  70-110 (MG/DL) Incomplete   12/22/5282 176* 70-110 (MG/DL) Final        [id number]   Date Value Range Status   06/13/2012 6.9* 3.4 to 5.9 (%) Final      (NOTE)      All Ages:       A1c Value   Interpretation       < 5.7%    Non-diabetic       5.7-6.4%   Increased risk for DM       > or = 6.5%   Consistent with DM      07/19/11      Analysis Performed by [facility] Reference laboratory: [address] [place]., [address]   02/13/2012 7.8* 3.4 to 5.9 (%) Final      (NOTE)      All Ages:       A1c Value   Interpretation       < 5.7%    Non-diabetic       5.7-6.4%   Increased risk for DM       > or = 6.5%   Consistent with DM      07/19/11      Analysis Performed by [facility] Reference laboratory: [address] [place]., [address]   12/08/2011 8.2* 3.4 to 5.9 (%) Final      (NOTE)      All Ages:       A1c Value   Interpretation       < 5.7%    Non-diabetic       5.7-6.4%   Increased risk for DM   > or = 6.5%   Consistent with DM      07/19/11      Analysis Performed by [facility] Reference laboratory: [address] [place]., [address]        MICROALBUMIN, URINE   Date Value Range Status   06/13/2012 <0.5  <3.0 (MG/DL) Final   04/16/8755 <4.3  <3.0 (MG/DL) Final   02/09/5187 3.3* <3.0 (MG/DL) Final         MICROALBUMIN/CREATININE RATIO, URINE   Date Value Range Status   06/13/2012 <3.0  <30 (MG/G) Final   02/13/2012 <30.8* <30 (MG/G) Final   12/08/2011 17.0  <30 (MG/G) Final       Assessment:    At wt goal, keeping food log, diet is balanced. Will eat a sliver of a sweetand is satisfied. Believed has met all goals but states she has been told she has sleep apnea and will need  a CPAP machine. Discussed with her that Program requires on if for 2 mos at least prior to surgery. A1c improved.    Pt ed: diet progression after bariatric surgery, reviewed sample menus    Goals: see PI    F/U: Bariatric wt. class  Weight goal: 270 lbs  A1C: 6.9 from 7.8  Food records brought: Y  Smoking:  Bariatric Support Group:Y  [facility] [place]:  Bariatric Packet given:   Drinking w/meals: N  Protein supplement:  Pureed: N, discussed today  Quiz: Y    F/U: 10/2 sched    Monaca Wadas L. Dayton Martes MS., RD., LD

## 2012-07-29 NOTE — Telephone Encounter (Addendum)
This was my note of 07/17/12:  REVIEW OF SLEEP STUDY:   Date of sleep study: 06/24/12   Type of Study: In-Lab PSG   Apnea/Hypopnea Indes (AHI): 3.4/hour   (<5 WNL, >40 severe)   Worse in: REM and AHI 22   With nocturnal oximetry:   O2 Saturation: Average: 92%, Low: 71%, 16 % of the ime < 90%   DATE: 05/22/12   PFT: FEV1: 2.32/88%   FVC: 3.15/95%   FEV1/FVC: 74%   FEF 25-75% : 1.65/59%   DATE: 05/09/11   PFT: FEV1: 2.42/91%   FVC: 3.28/99%   FEV1/FVC: 74%   FEF 25-75%: 1.78/63%   TLC : 7.34/134%   DLCO: 18.3/64%   DLCO/VA: 4.87/130%  Has REM related Sleep apnea with desaturations   Spoke to her and discussed ordering cpap at 8 cpap. with wt loss she should not need cpap lifelong.   Once on cpap she should use in post-op period.    I ordered cpap due to her REM related obstructive sleep apnea and desaturations.

## 2012-07-29 NOTE — Telephone Encounter (Addendum)
Dear Dr. Katrinka Blazing,  Thank you for your pre- bariatric surgery clearance consult on 04/11/12.  Patient had PFT on 05/30/12 and normal sleep study on 06/28/12.  See results. below  I she cleared for pending bariatric surgery this Fall 2013 ?  Thank you,  Abbey Chatters RN  Bariatric Case Manager  [facility] [place]  [phone]  [phone]        Call Documentation       Eden Lathe (M.D.) 05/30/2012 6:22 PM Signed   DATE: 05/22/12   PFT: FEV1: 2.32/88%   FVC: 3.15/95%   FEV1/FVC: 74%   FEF 25-75% : 1.65/59%   DATE: 05/09/11   PFT: FEV1: 2.42/91%   FVC: 3.28/99%   FEV1/FVC: 74%   FEF 25-75%: 1.78/63%   TLC : 7.34/134%   DLCO: 18.3/64%   DLCO/VA: 4.87/130%  No change. Sleep test pending.         PATIENT INFORMATION      Patient Name  MRN  Sex  Eileen Barnes     Eileen Barnes, Eileen Barnes  [id number]  Female  January 19, 1951      Visit Information          Date & Time  Provider  Department  Encounter #  Center     06/24/2012 9:00 PM  Sleep Technician  [facility]  [id number]  PRM        Referring Provider       Concha Se (M.D.) Katrinka Blazing     Visit Diagnoses and Associated Orders       SNORING - Primary    786.09    POLYSOMNOGRAPHY, SLEEP STAGING W =>4 ADDL PARAMETERS, ATTENDED, =>6 YEARS [96295 CPT(R)]          Description  Code  Modifiers  Qty     POLYSOM 6/>YRS SLEEP 4/> ADDL PARAM ATTND  28413   1     OTHER DYSPNEA AND RESPIRATORY ABNORMALITIES [786.09]           Oswaldo Done Tue Jun 25, 2012 2:53 AM       AVS distributed and discussed. Patient verbalizes understanding and has no additional questions.              Electronically signed by: Oswaldo Done at [facility] Jun 25, 2012 2:53 AM     Progress Notes         Body Mass Index (BMI): Estimated Body mass index is 41.44 kg/(m^2) as calculated from the following:   Height as of 05/09/12: 5\' 7" (1.702 m).   Weight as of 06/13/12: 264 lb 9.6 oz(120.022 kg).         Adult Weight Status by BMI: Less than 18.5 is underweight, 18.5-24.9 is normal, 25-29.9 is overweight, greater than 30 is obese. For more information  please visit [email or web address]. If no height or weight are displayed above, this information can be disregarded.

## 2012-08-05 ENCOUNTER — Encounter

## 2012-08-05 NOTE — Telephone Encounter (Addendum)
Dear Dr. Katrinka Blazing,  You ordered Cpap.  Patient does dot have DME coverage with [facility] for Cpap and Medicare denies approval based on sleep study results. She cannot afford self payment.  Please advise.  Thank you  Abbey Chatters RN  Bariatric Case Manager  [facility] [place]  [phone]  [phone]

## 2012-08-05 NOTE — Telephone Encounter (Addendum)
For total study the AHI was 3.4 which is with in normal range. However she had REM related apneas of AHI 22.  If Medicare will not cover due to low overall AHI she will not get machine.    Recommend at time of surgery:  limit sedating analgesics as much as possible  to use supplemental oxygen if needed to maintain oxygen sat > 92%.

## 2012-08-06 NOTE — Telephone Encounter (Addendum)
Dr. Katrinka Blazing,   thank you for your advice. I informed patient.  I will forward this to Dr. Raelyn Ensign RN  Bariatric Case Manager  [facility] [place]  [phone]  [phone]

## 2012-08-07 NOTE — Progress Notes (Addendum)
The patient, Eileen Barnes , identity was verified by name and MRN.  Please see scanned note for details.    Lucile Crater.D.

## 2012-08-20 NOTE — Progress Notes (Addendum)
The patient, Eileen Barnes's, identity was verified by name.    Bariatric Obesity Program Visit #7    Pt attended Bariatric Weight Loss Class in preparation for bariatric surgery. Discussed function ofprotein in the diet, protein content of  foods and educated on how to determine amount of protein in foods. Discussed importance of keeping food diary before and after surgery, discussed importance of reducing sugar in the diet prior to surgery to aid with weight loss and to prepare for post-surgery diet, drinking with meals, and reviewed diet progression after bariatric surgery. Reviewed bariatric quiz in detail.    HOs given: How to Get Your Protein.    PA reported: bike 3-4x/week-15 - 30 min, water aerobics 2-3x/wk 1 hr    Wt Readings from Last 3 Encounters:   08/20/12 264 lb 9.6 oz (120.022 kg)   08/07/12 265 lb (120.203 kg)   07/24/12 268 lb (121.564 kg)       Weight Goal: 268, met  Food Diary:Y      F/U: Bariatric Weight Loss Class 09/2012    Gianina Olinde L. Dayton Martes MS., RD., LD

## 2012-09-03 ENCOUNTER — Telehealth

## 2012-09-03 MED ORDER — BLOOD GLUCOSE TEST VI STRP
Status: DC
Start: 2012-09-03 — End: 2014-09-16

## 2012-09-03 NOTE — Progress Notes (Addendum)
09/03/2012 11:11 AM received a verbal order for one touch strips from Dr. Bobbye Morton.  Order repeated back to Dr. Bobbye Morton and order confirmed.  Diagnosis to be associated with order dm, diagnosis confirmed.

## 2012-09-03 NOTE — Telephone Encounter (Addendum)
Mbr is asking for refll  And using Mail Order pharmacy    ONE TOUCH ULTRA TEST MISC STRIPS 200 5/6 07/04/2011 07/03/2012 No Sig: USE AS DIRECTED BY PHYSICIAN FOR DIABETES        Telephone Information:   Home Phone [phone]   Work Phone Not on file.   Mobile [phone]     advised member of 24-48 hour call back response from doctor

## 2012-09-03 NOTE — Progress Notes (Addendum)
Chrisandra Carota (R.N.) 09/03/2012 11:10 AM Signed   See my note for verbal orders.  Adalberto Cole, [place] 09/03/2012 11:02 AM Signed   Mbr is asking for refll   And using Mail Order pharmacy   ONE TOUCH ULTRA TEST MISC STRIPS 200 5/6 07/04/2011 07/03/2012 No Sig: USE AS DIRECTED BY PHYSICIAN FOR DIABETES

## 2012-09-03 NOTE — Telephone Encounter (Addendum)
See my note for verbal orders.

## 2012-09-05 NOTE — Patient Instructions (Addendum)
YOU HAVE BEEN SCHEDULED FOR SURGERY AT:  [facility] [address], [place], [place]    Your surgery will be performed by: Virgina Jock, MD  The scheduled date of your surgery is: 10/14/12     Pre-Admission Testing is required prior to your surgery.  Your Pre-Admission Testing has been scheduled on: 09/30/12 at 2:00 o'clock at:     [facility], Registration Desk "D", [address], [address], [place] [phone]    There are no special preparations for Pre-admission Testing.  You may eat your regular diet and take all of your regularly prescribed medications, unless otherwise instructed by your surgeon (ask him/her whether you should stop taking aspirin, anti-inflammatory medications, ibuprofen or specific pain medications 10 - 14 days prior to your surgery; these medications can prolong clotting time and may cause excessive bleeding during and after your surgery.)  Bring a list of all your medications with you.    It is VERY important that you keep your appointment with Pre-Admission Testing, as your surgery could be canceled if you have not completed the testing required beforehand.  If for any reason you must cancel or change your appointment, please call the number above, for the facility where your testing is scheduled.    The day before your surgery: Please call to find out what time to report to admitting:  [facility]: Inpatients call [phone]; Outpatients [phone]    Please call [phone] or [phone] if you have any questions or need to reschedule any of your appointments.    IN THE EVENT THAT YOU NEED TO CANCEL YOUR SURGERY AT ANY LOCATION, PLEASE CALL [phone].OLYVE STROBLE [phone] [email or web address] @    DEPARTMENT OF PRE-ADMISSION TESTING [facility]  Patient safety is our goal during your surgical experience.    Your pre-admission testing consists of a detailed interview and exam, performed by a Physician Assistant (P.A.). To help  ensure that your surgery and anesthesia can be done in the safest manner possible, you will need to schedule a pre-admission appointment within 30 days prior to your surgery (as required by law).    During your P.A.T. appointment you can expect the following:    * Your appointment will last 30 - 60 minutes.   * A P.A. will take a detailed medical history and perform a physical exam. The  P.A may order lab work, EKG, and/or X-ray to be completed before your surgery.    * Please bring a list of your current medications (prescription medications as well as over-the-counter vitamins and supplements); the dose for each medication on the list; and how often you take each medication.  * Please bring a list of your past surgical history.  * If you recently have had tests (cardiac tests, pulmonary tests, etc.) performed outside of the [facility] system, please bring the results to your P.A.T. appointment.   * If your surgeon has instructed you to stop any medications, please follow these instructions. Further instructions will be given at your pre-admission appointment.   * Please dress casually for your exam.     The Pre-Admission Department may contact you if any clearance appointments are needed prior to your surgery. Please let us know if your phone numbers change.    Please bring a translator to both your P.A.T. appointment and surgery, if you require one.    P.A.T. Date/Location: 09/30/12 [facility]  Surgery Date/Location: 10/14/12 [facility]  Postoperative Follow-up Date/Location: 10/24/12 9:30am [facility]  To contact the pre-admission staff, please use the following information:    [place]: [address] [place], [place] [address] -- [phone]    [facility]: [address] [place]., [place] [address] -- [phone]    [place]: [address] [place], [place] [address]-- [phone]    If you need to cancel your surgery, please call the surgery cancellation line at: [phone]    You will be contacted to  set up your EGD.    Do you understand your plan of care today? Is there anything else I can help you with today? You may be getting a survey in the mail regarding your care today. If you do please take a few minutes to fill it out and mail it back to [place]. It would be greatly appreciated.

## 2012-09-05 NOTE — Progress Notes (Addendum)
The patient, Eileen Barnes, identity was verified by name and MRN.     SUBJECTIVE:     Eileen Barnes is a 61 year old female whose Estimated Body mass index is 41.66 kg/(m^2) as calculated from the following:    Height as of this encounter: 5\' 7" (1.702 m).    Weight as of this encounter: 266 lb(120.657 kg)..  She has successfully completed dietary and behavioral health evaluation.    I spent at least half ofthis 30 minute office visit counseling the patient concerning the risks and benefits of Bariatric surgery.  Laparoscopic roux en y gastric bypass, laparoscopic sleeve gastrectomy and the laparoscopic placement of adjustable gastric band were discussed.  Informed Ajna about the national published mortality rates of 0.2% for gastric bypass and sleeve gastrectomy and 0.05% for laparoscopic adjustable gastric band placement.  Explained that the most common cause of death nationally is pulmonary embolus.  Explained that our program's rate of pulmonary embolus was around 1% with no mortality and that all patients receive prophylaxis with compression stockings and subcutaneous Heparin.  Explained that leaks can occur with gastric bypass and sleeve gastrectomy and that all staple lines are leak tested before the procedure is completed.  All procedures are evaluated with contrast X-ray after surgery.  Our program has has one leak that manifested 5 days after surgery with normal postop studies with no mortality.  In our program, we have had one death in about 400 gastric bypass patients due to cardiac arrhythmia.  Informed that the wound infection rate is approximately 2%, incisional hernia rate is approximately 5 %, risk of pneumonia is approximately 4% and small bowel obstruction are approximately 1-2%.Explained that with gastric bypass the risks of ulcer, stricture, and GI bleed are about 1-2%    The results of gastric bypass surgery are that 80% of patients have successful weight loss that is maintained at 10  years.  Ten (10) percent do not achieve successful weight loss and 10% achieve successful weight but regain.  Results with sleeve gastrectomy are similar, but there is little data beyond 5 years post-procedure.  Results with laparoscopic adjustable gastric banding show about 50% achieve successful weight loss.  Comorbidities such as type 2 diabetes mellitus, hypertension and obstruction sleep apnea show about 70% resolution after surgery.  On average, patients lose approximately 70% of their excess body weight after surgery.  The weight loss is generally greater in younger patients and less in older patients.  Explained that NSAID'S (except for baby aspirin in patients at risk for coronary disease) are contraindicated for life due to the risk of pouch inflammation, ulceration and stricture formation in gastric bypass.   The use of NSAID'S appears to be relatively safe with sleeve gastrectomy and laparoscopic adjustable gastric banding.    For gastric bypass and sleeve gastrectomy, pain control is via IV PCA for the first 48 hours.  Water by mouth is started on the first post-op day and  clear liquids on the second post-op day.  Most medications are given intravenously for the first 48 hours, then given orally starting on post-op day two.  Half of our  patients go home at the end of the second post-op day, most go home by the third post-op day.   For patients with laparoscopic adjustable gastric bands, they have IV pain medications for the first 24 hours, and start water and oral medications the day of surgery.Clear liquids are started the next day and they are usually discharged home that  day. Follow-up is at ten days check incisions and remove staples and drains as needed, six weeks, and then every three months for the first year with labs,and then annually thereafter with their PCP with labs.  The patient goes home on a liquid diet, starts their supplements when they get home. Pureed foods are started on day 10  after surgery and solid foods started on day 28.  The first laparoscopic adjustable gastric band adjustment usually occurs at the six week visit.  Subsequent band adjustments will occur at each visit, or between visits if symptoms warrant.  An upper GI series will be done at the one year visit to document adequate position and function of the band.  After each band adjustment, the patients are advised to stay on a liquid diet for at least 24 hours.    Explained that all Bariatric surgery patients will need to take dietary supplements and eat a healthy diet in order to achieve healthy weight loss.  They all should have lab tests to check for anemia, electrolyte abnormalities, vitamin deficiencies and adequate protein synthesis with weight loss and will need to have these done life long.  Gastric bypass patients and sleeve gastrectomy patients will also need to have iron and B12 levels checked.  Failure to do so may lead to life threatening complications.    PAST MEDICAL HISTORY  Past Medical History   Diagnosis Date   ??? SEVERE OBESITY (BMI >= 40) 07/09/2004     MORBID OBESITY   ??? MENOPAUSE 08/16/2004     FEMALE CLIMACTERIC STATE   ??? ACTINIC KERATOSIS. 02/12/2003     ACTINIC KERATOSIS   ??? DM 2, UNCONTROLLED, W DIABETIC POLYNEUROPATHY.    ??? ESSENTIAL HTN    ??? HYPERLIPIDEMIA    ??? GOUT        PAST SURGICAL HISTORY  Past Surgical History   Procedure Date   ??? Lasik        ALLERGIES  Allergies as of 09/05/2012 Loraine Leriche as Reviewed 09/05/2012   Allergen Reaction Noted   ??? Codeine and opiate derivatives Nausea and/or Vomiting 03/04/2007   ??? Kiwi fruit  03/04/2007   ??? Lisinopril  07/06/2011   ??? Lovastatin  12/11/2011   ??? Pamelor (nortriptyline)  03/18/2007   ??? Talwin (pentazocine lactate) Nausea and/or Vomiting 03/18/2007   ??? Tramadol hcl Nausea and/or Vomiting 03/04/2007   ??? Vicodin (acetaminophen-hydrocodone)  03/18/2007       MEDICATIONS  Current Outpatient Prescriptions   Medication Sig Dispense Refill   ??? ONE TOUCH ULTRA TEST  MISC STRIPS USE AS DIRECTED FOR DIABETES  100  5   ??? GLYBURIDE 5 MG ORAL TAB TAKE 1 TABLET BY MOUTH EVERY MORNING AND TAKE 3 TABLETS EVERY EVENING FOR DIABETES  360  1   ??? HUMULIN N 100 UNIT/ML SUBQ SUSP INJECT 24 UNITS SUBCUTANEOUSLY EVERY MORNING AND 35 UNITS EVERY EVENING FOR DIABETES AND MAY TREAT TO TARGET ADJUSTING DOSE AS NEEDED  80  3   ??? SERTRALINE 100 MG ORAL TAB TAKE 1 TABLET BY MOUTH ONE TIME DAILY  60  1   ??? OMEPRAZOLE 20 MG ORAL CPDR SR CAP TAKE 1 CAPSULE BY MOUTH TWICE DAILY FOR STOMACH  120  2   ??? METFORMIN 1000 MG ORAL TAB TAKE 1 TABLET BY MOUTH TWICE DAILY FOR DIABETES  120  3   ??? ALLOPURINOL 100 MG ORAL TAB TAKE 2 TABLETS BY MOUTH ONE TIME DAILY TO PREVENT GOUT  186  3   ???  LOSARTAN  50 MG ORAL TAB TAKE 1 TABLET BY MOUTH ONE TIME DAILY  90  3   ??? TRAZODONE  50 MG ORAL TAB TAKE 1/2 TO 2 TABLETS BY MOUTH EVERY NIGHT AT BEDTIME AS NEEDED FOR SLEEP  120  3   ??? ATENOLOL  50 MG ORAL TAB TAKE 1 TABLET BY MOUTH TWICE DAILY FOR BLOOD PRESSURE   3   ??? FUROSEMIDE 20 MG ORAL TAB TAKE 1 TO 2 TABLETS BY MOUTH ONCE A  DAY PRN FOR FLUID RETENTION  100  2   ??? BD INSULIN SYRINGE ULT-FINE II 1/2 ML 31 X 5/16" MISC SYRINGE USE AS DIRECTED FOR DIABETES  100  5   ??? SIMVASTATIN 40 MG ORAL TAB TAKE 1 TABLET BY MOUTH EVERY NIGHT AT BEDTIME  60  5   ??? ASPIRIN   81 MG ORAL TBEC DR TAB 1 TAB PO DAILY  100  0   ??? ALBUTEROL SULFATE 2.5 MG /3 ML (0.083 %) INHL NEB SOLN USE 1 VIAL VIA NEBULIZER AS DIRECTED  1  0   ??? MECLIZINE 25 MG ORAL TAB 1/2 to 1 tablet three times daily as needed for vertigo  90  0   ??? VITAMIN D-3 2000 UNIT ORAL TAB Take 1-1-1/2 tabs po qd  45  3        ROS  No prior abdominal surgery.  Self treated with omeprazole for last year for reflux    OBJECTIVE:     BP 138/80   Pulse 70   Temp(Src) 98.4 ??F (36.9 ??C) (Oral)   Resp 18   Ht 5\' 7"  (1.702 m)   Wt 266 lb (120.657 kg)   BMI 41.66 kg/m2    GENERAL APPEARANCE: alert, oriented, no acute distress and obese  LUNGS:clear to auscultation, no wheezes or rales and  unlabored breathing  HEART: regular rate and rhythm, no murmurs  ABDOMEN: soft, nontender, nondistended, no masses or organomegaly    ASSESSMENT/PLAN:    SEVERE OBESITY (BMI >= 40)  (primary encounter diagnosis)  Note: meets criteria for surgery  Plan: PRECERTIFICATION & SCHEDULE SURG PROCEDURE         INPT, REFERRAL ESOPHAGOGASTRODUODENOSCOPY        Scheduled for laparoscopic gastric bypass 10/14/2012    DM 2  Note: well controlled.  On insulin for 2 years  Plan: PRECERTIFICATION & SCHEDULE SURG PROCEDURE INPT            HYPERTENSION  Note: well controlled  Plan: PRECERTIFICATION & SCHEDULE SURG PROCEDURE INPT              FOLLOW-UP: 10 days postop    The usual risks, benefits and alternatives of surgery were discussed with the patient including, but not limited to, infection, bleeding, allergy to medicines which may or may not have been previously taken, anesthesia, injury to nerves, arteries, veins or other adjacent organs or structures in the operative field, and the possibility of cardiac, pulmonary, GI, GU, neurologic or other systemic complications.    The patient and the patient's spouse appear(s) to understand these risks, and informed consent is confirmed.

## 2012-09-12 NOTE — Telephone Encounter (Addendum)
Patient contacted/called and Gastroscopy scheduled with Dr Talmadge Coventry on 09/20/12 @ 2:30PM. Order IV start Instructions and prep mailed.

## 2012-09-16 ENCOUNTER — Telehealth

## 2012-09-16 NOTE — Progress Notes (Addendum)
The patient, Eileen Barnes's, identity was verified by name.    Bariatric Obesity Program Visit #8      Change in medical history: N    Current Outpatient Prescriptions on File Prior to Visit   Medication Sig Dispense Refill   ??? ONE TOUCH ULTRA TEST MISC STRIPS USE AS DIRECTED FOR DIABETES  100  5   ??? GLYBURIDE 5 MG ORAL TAB TAKE 1 TABLET BY MOUTH EVERY MORNING AND TAKE 3 TABLETS EVERY EVENING FOR DIABETES  360  1   ??? HUMULIN N 100 UNIT/ML SUBQ SUSP INJECT 24 UNITS SUBCUTANEOUSLY EVERY MORNING AND 35 UNITS EVERY EVENING FOR DIABETES AND MAY TREAT TO TARGET ADJUSTING DOSE AS NEEDED  80  3   ??? SERTRALINE 100 MG ORAL TAB TAKE 1 TABLET BY MOUTH ONE TIME DAILY  60  1   ??? OMEPRAZOLE 20 MG ORAL CPDR SR CAP TAKE 1 CAPSULE BY MOUTH TWICE DAILY FOR STOMACH  120  2   ??? METFORMIN 1000 MG ORAL TAB TAKE 1 TABLET BY MOUTH TWICE DAILY FOR DIABETES  120  3   ??? ALBUTEROL SULFATE 2.5 MG /3ML (0.083 %) INHL NEB SOLN USE 1 VIAL VIA NEBULIZER AS DIRECTED  1  0   ??? ALLOPURINOL 100 MG ORAL TAB TAKE 2 TABLETS BY MOUTH ONE TIME DAILY TO PREVENT GOUT  186  3   ??? LOSARTAN  50 MG ORAL TAB TAKE 1 TABLET BY MOUTH ONE TIME DAILY  90  3   ??? TRAZODONE50 MG ORAL TAB TAKE 1/2 TO 2 TABLETS BY MOUTH EVERY NIGHT AT BEDTIME AS NEEDED FOR SLEEP  120  3   ??? ATENOLOL  50 MG ORAL TAB TAKE 1 TABLET BY MOUTH TWICE DAILY FOR BLOOD PRESSURE  180  3   ??? FUROSEMIDE 20 MG ORAL TAB TAKE 1 TO 2 TABLETS BY MOUTH ONCE A  DAY PRN FOR FLUID RETENTION  100  2   ??? BD INSULIN SYRINGE ULT-FINE II 1/2 ML 31 X 5/16" MISC SYRINGE USE AS DIRECTED FOR DIABETES  100  5   ??? SIMVASTATIN 40 MG ORAL TAB TAKE 1 TABLET BY MOUTH EVERY NIGHT AT BEDTIME  60  5   ??? MECLIZINE 25 MG ORAL TAB 1/2 to 1 tablet three times daily as needed for vertigo  90  0   ??? ASPIRIN   81 MG ORAL TBEC DR TAB 1 TAB PO DAILY  100  0   ??? VITAMIN D-3 2000 UNIT ORAL TAB Take 1-1-1/2 tabs po qd  45  3       Supplements/herbs/alternative therapy: D3 2,000 IU, MV, B12      Assessment:    S: Having EGD before bariatric  surgery d/t Hx of GERD..  O: 61 YOF    Wt Readings from Last 3 Encounters:   09/16/12 265 lb 3.2 oz (120.294 kg)   09/05/12 266 lb (120.657 kg)   08/20/12 264 lb 9.6 oz (120.022 kg)        A: Pt maintaining wt. Sched for sleeve gastrectomy on 10/14/2012. States is getting regular PA and feels good. Keeping food log. Preparing for post surgery at home with foods/ fluids.  P: Pt to continue with current diet and PA. Understands maintaining weight.    Pt ed: Reviewed in detail sample meal plans for post surgery diet progression      PA: 15 min treadmill, 15 min recumbent  F/U: Bariatric wt. class  Weight goal: 268 lbs  A1C:  Food records brought: Y  Smoking:  Bariatric Support Group: Y  [facility] [place]:  Bariatric Packet given: Y  Drinking w/meals: still practicing  Protein supplement: Y  Pureed: Y  Quiz: Y    F/U: 12011/2013 10 day tele f/u sched    Jaleisa Brose L. [place] Martes MS., RD., LD

## 2012-09-16 NOTE — Progress Notes (Addendum)
SUBJECTIVE:   Eileen Barnes, is a 61 year old, female Type 2 DM identified for Diabetes Care management.   Spoke with pt today. Patient denies ADR's and states adherence to medications.   Pt denies hypoglycemic incidents.   SMBG :   On 25 units insulin am and 47 units pm.     Fasting am  11/2 93  11/3 110  11/4 87  reports that she quit smoking about 18 years ago. She has never used smokeless tobacco.   Current Outpatient Prescriptions    Medication  Sig  Dispense  Refill    ???  FUROSEMIDE 20 MG ORAL TAB  TAKE 1 TO 2 TABLETS BY MOUTH ONCE A DAY PRN FOR FLUID RETENTION  100  2    ???  GLYBURIDE 5 MG ORAL TAB  TAKE 1 TABLET BY MOUTH EVERY MORNING AND TAKE 3 TABLETS EVERY EVENING FOR DIABETES  360  2    ???  METFORMIN 1000 MG ORAL TAB  TAKE 1 TABLET BY MOUTH TWICE DAILY FOR DIABETES  120  3    ???  BD INSULIN SYRINGE ULT-FINE II 1/2 ML 31 X 5/16" MISC SYRINGE  USE AS DIRECTED FOR DIABETES  100  5    ???  SIMVASTATIN 40 MG ORAL TAB  TAKE 1 TABLET BY MOUTH EVERY NIGHT AT BEDTIME  60  5    ???  MECLIZINE 25 MG ORAL TAB  1/2 to 1 tablet three times daily as needed for vertigo  90  0    ???  VANIQA 13.9 % TOP CREA  APPLY TO AFFECTED AREAS TWICE DAILY  30  6    ???  ONE TOUCH ULTRA TEST MISC STRIPS  USE AS DIRECTED BY PHYSICIAN FOR DIABETES  200  6    ???  ONE TOUCH DELICA LANCETS MISC MISC  USE AS DIRECTED BY PHYSICIAN  200  5    ???  SERTRALINE 100 MG ORAL TAB  TAKE 1 TABLETBY MOUTH ONE TIME DAILY  60  3    ???  LOSARTAN 25 MG ORAL TAB  TAKE 1 TABLET BY MOUTH ONE TIME DAILY  93  3    ???  OMEPRAZOLE 20 MG ORAL CPDR SR CAP  TAKE 1 CAPSULE BY MOUTH TWICE DAILY FOR STOMACH  120  2    ???  ATENOLOL 50 MG ORAL TAB  TAKE 1 TABLET BY MOUTH TWICE DAILY FOR BLOOD PRESSURE  180  3    ???  ALLOPURINOL 100 MG ORAL TAB  TAKE 2 TABLETS BY MOUTH ONE TIME DAILY TO PREVENT GOUT   3    ???  FLUTICASONE 50 MCG/ACTUATION NASL SPSN  USE 2 SPRAYS IN EACH NOSTRIL ONE TIME DAILY  32  5    ???  QVAR 80 MCG/ACTUATION INHL AERO  USE 2 PUFFS BY MOUTH TWICE DAILY FOR ASTHMA  PREVENTION AND CONTROL  26.1  3    ???  PROAIR HFA 90 MCG/ACTUATION INHL HFAA  USE 2 PUFFS BY MOUTH EVERY 4 TO 6 HOURS AS NEEDED FOR WHEEZING AND COUGH  8.5  2    ???  NOVOLIN N 100 UNIT/ML SUBQ SUSP  INJECT 22 UNITS SUBCUTANEOUSLY EVERY MORNING AND 35 UNITS EVERY EVENING AND MAY TREAT TO TARGET WITH A BLOOD SUGAR GOAL OF 90 TO 130 MG/DL PRIOR TO MEALS FOR DIABETES  20  5    ???  ASPIRIN 81 MG ORAL TBEC DR TAB  1 TAB PO DAILY  100  0    ???  VITAMIN D-3 2000 UNIT ORAL TAB  Take 1-1-1/2 tabs po qd  45  3    OBJECTIVE:   Last PCP OV: 12/11/11   LAST DIABETIC RETINAL EXAM (Ordered Date): 05/31/11   Vitals:   BP Readings from Last 3 Encounters:    12/13/11  138/74    12/11/11  130/82    11/29/11  144/80    Labs:      Diabetes 04/11/2011 04/11/2011 04/21/2011 12/08/2011 02/13/2012   HBA1c 7.8 (H)   8.2 (H) 7.8 (H)   Fasting Glucose    154 (H)    Cholesterol 133   207 (H) 141   Triglyceride    217 (H)    LDL Direct 72   143 (H) 86   HDL 38 (L)   35 (L) 37 (L)   Creatinine 0.60  0.67 0.57    ALT    47 42   Potassium 4.3   4.3    Microalbumin/CR  <31.0 (H)  17.0 <30.8 (H)     Diabetes 06/13/2012   HBA1c 6.9 (H)   Fasting Glucose    Cholesterol 124   Triglyceride 165 (H)   LDL Direct 81   HDL 34 (L)   Creatinine    ALT    Potassium    Microalbumin/CR <3.0     Immunization History    Administered  Date(s) Administered    ???  INF (Influenza) unspecified formulation  09/24/1991, 08/25/1992,10/30/2000, 10/17/2001, 10/04/2002, 09/15/2003, 09/08/2004    ???  INF H1N1-09 standard dose (Influenza H1N1-09).  11/03/2008    ???  INFs 87yrs and over (Influenza)  08/28/2008, 08/31/2010    ???  INFs 59yrs and over (influenza)  11/29/2007    ???  INFs pres free 103yrs-adult (Influenza)  09/16/2011    ???  PNUps (Pneumococcal polysaccharide, pneumonia)  10/10/2006    ???  Td 81yrs-adult (Tetanus, diphtheria)  04/07/1993, 10/10/2006      Allergies    Allergen  Reactions    ???  Codeine And Opiate Derivatives  Nausea and/or Vomiting    ???  Kiwi Fruit       Pt. States throat closes  up    ???  Lisinopril       Cough    ???  Lovastatin     ???  Pamelor (Nortriptyline)       Severe constipation    ???  Talwin (Pentazocine Lactate)  Nausea and/or Vomiting    ???  Tramadol Hcl  Nausea and/or Vomiting    ???  Vicodin (Acetaminophen-Hydrocodone)       Vomiting    ASSESSMENT   1. DM: A1C goal <7.5%, pt is controlled  2. Hyperlipidemia:LDL goal <100 mg/dl, pt is controlled   3. Hypertension: BP goal <140/90 mm HG, pt is controlled   4. A-L-L meds currently taking: ASA yes, statin has not been taking, Lisinopril yes   5. Diabetic retinopathy? Dilated eye exam not needed.   PLAN:   SMBG :   On 25 units insulin am and 47 units pm.     Fasting am  11/2 93  11/3 110  11/4 87  Following process for bariatrics now.  Has re-started simvastatin 40 mg on 12/15/11.  2. Labs: being done for preop on 09/30/12.   3. Follow up by Care Manager as needed.   Education given to pt: discussed diet and labs and medications

## 2012-09-26 ENCOUNTER — Telehealth

## 2012-09-26 NOTE — Telephone Encounter (Addendum)
[facility] is calling to request a refill for:    VANIQA 13.9 % TOP CREA 30 6/6 08/16/2011 08/15/2012 -- Sig - Route: APPLY TO AFFECTED AREAS TWICE DAILY - Topical    [facility]: [facility]    Pharmacy Address:  [address]    Pharmacy Phone Number: [phone]      Member Phone:   Home Phone [phone]       Mobile [phone]     Patient instructed to contact the pharmacy prior to picking up the medication.    [facility] advised of 24-48 hour response time

## 2012-09-27 NOTE — Telephone Encounter (Addendum)
Endoscopy procedure results letter per Dr. Thornton mailed to patient. Recall per referring MD

## 2012-09-29 NOTE — Telephone Encounter (Addendum)
OK for Vaniqa cream - I put in order as an external Rx - please call in #30 gm, apply thin film twice daily, 6R to her requested pharmacy, and Please inform the patient.

## 2012-09-30 MED ORDER — EFLORNITHINE HCL 13.9 % EX CREA
13.9 % | CUTANEOUS | Status: AC
Start: 2012-09-30 — End: 2013-09-29

## 2012-09-30 NOTE — Telephone Encounter (Addendum)
Good Afternoon Dr. Benjaman Kindler,    Today your patient, Eileen Barnes, had an appointment in PAT regarding her gastric sleeve procedure scheduled with you on 10/14/12.  I wanted to send you an FYI that your Pt appeared extremely anxious at today's visit regarding her upcoming surgery and low O2 sat during a sleep study she had 06/28/12. The Pt was concerned that she did not discuss the sleep study with you at your last visit together. She was also concerned about the biopsy results from a recent EGD she had and whether it would change your decision about the gastric sleeve operation.    Thank you!    Meredith Leeds, PA-C    Sleep Study: 06/28/12:  "Patient arrived at 8:53 PM. Study started at 10:27PM at approximate normal bedtime. Consent form obtained. Mild snoring was observed. The Patient Apnea/Hypopnea index was 3.4 per hour. The Patient achieved all stages of sleep. The patient spent the study on either side and supine. Sleep Study completed. Patient discharged.     [name] (D.O.) 06/28/2012 3:16 PM Signed   Sleep history: The patient is [age] year old female with snoring. The study started at 22:26:12 and ended at 05:33:38. Total sleep time was 333 minutes resulting in a sleep efficiency of 79%. There were 10 awakenings with a total time awake after sleep onset of 50 minutes.The sleep latency was 39 minutes and the REM latency was 162 minutes. The patient spent 50.2% of sleep time in the supine position. The sleep stage percentages were 5.7% stage 1, 49.0% stage 2, 30.0% stages 3 & 4 and 15.3% REM sleep. There were 16 arousals, resulting in an arousal index of 2.9. There were 42 stage shifts. Light snoring was noted. There were 19 respiratory events consisting of 0 apneas (0 obstructive, 0 central, 0 mixed) and 19 hypopneas. The apnea-hypopnea index was 3.4. The mean oxygen saturation during the study was 92.0%, with a minimum oxygen saturation of 71.0%. The patient spent 16.1% of sleep time with an  oxygen saturation below 90%. The PLM index is 0.  ICSD DIAGNOSIS:  Snoring  IMPRESSION:  The overal AHI and PLM indices are within normal limits. There is no evidence of obstructive sleep apnea or periodic limb movement disorder.   INTERPRETING PHYSICIAN:  Dr. Jenene Slicker  Diplomate ABPN, certified in sleep medicine."

## 2012-09-30 NOTE — Telephone Encounter (Addendum)
Mckinsley A Fackrell a 61 year old female, calling back regarding Rx refill, order noted in encounter, RX called into [facility] in [place] , pharmacy as written below ,  to  [place]  [facility],,Informed.  mbr  to call pharmacy prior to pick up, to make sure Rx is ready. MBR verbalizes understanding and states will comply..    Triage Plan:  Demographics verified, Instructed to call back if symptoms persist/change/worsen and Verbalized understanding/willingness to follow instructions     Assaf, Bonna Gains (M.D.) 09/29/2012 11:23 PM Signed   OK for Vaniqa cream - I put in order as an external Rx - please call in #30 gm, apply thin film twice daily, 6R to her requested pharmacy, and Please inform the patient.   Carrie Mew, MSR 09/26/2012 1:25 PM Signed   Mbr is calling to request a refill for:   VANIQA 13.9 % TOP CREA 30 6/6 08/16/2011 08/15/2012 -- Sig - Route: APPLY TO AFFECTED AREAS TWICE DAILY - Topical  [facility]   Pharmacy Address: [address]   Pharmacy Phone Number: [phone]   Member Phone:      Home Phone  [phone]            Mobile  [phone]      Patient instructed to contact the pharmacy prior to picking up the medication.   Mbr advised of 24-48 hour response time

## 2012-10-01 NOTE — Progress Notes (Addendum)
PRE-OPERATIVE ASSESSMENT PREADMISSION TESTING     The patient, Eileen Barnes, identity was verified by name and MRN.      History of Present Illness:  Pt is a 61 year old female who presents today c/o severe obesity, DM, and HTN. 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 Patient states she has tried multiple weight loss programs in the past, including Weight Watchers, without success.  Prior abdominal surgeries include a appendectomy at age 55. Patient denies abdominal pain, N/V/D/C. Denies fever/chills. Denies melena and hematochezia.  Procedure: Laparoscopic Sleeve Gastrectomy  Planned OR Date: 10/14/2012  Anesthesia Requirement: general   Reason for Inpatient Surgery: * Patient requires length of stay > 24 hours  Primary Surgeon: Elmer Sow. Benjaman Kindler, MD     Diagnosis:  Severe obesity, BMI >40, DM, HTN    LATEX allergy (reviewed by CMA):  None.    REVIEWED TODAY by CMA:  Allergies   Allergen Reactions   ??? Codeine And Opiate Derivatives Nausea and/or Vomiting   ??? Kiwi Fruit      Pt. States throat closes up    ??? Lisinopril      Cough     ??? Lovastatin    ??? Pamelor (Nortriptyline)      Severe constipation   ??? Talwin (Pentazocine Lactate) Nausea and/or Vomiting   ??? Tramadol Hcl Nausea and/or Vomiting   ??? Vicodin (Acetaminophen-Hydrocodone)      Vomiting       History of severe PONV following colonoscopies.     Outpatient Prescriptions Marked as Taking for the 09/30/12 encounter (Office Visit) with Susa Griffins A:  VANIQA 13.9 % TOP CREA APPLY THIN FILM TWICE DAILY TO AFFECTED AREAS Disp: 30 Rfl: 6   METFORMIN  500 MG ORAL TAB TAKE 2 TABLETS BY MOUTH TWICE DAILY WITH FOOD FOR DIABETES Disp: 360 Rfl: 3   ONE TOUCH ULTRA TEST MISC STRIPS USE AS DIRECTED FOR DIABETES Disp: 100 Rfl: 5   GLYBURIDE 5 MG ORAL TAB TAKE 1 TABLET BY MOUTH EVERY MORNING AND TAKE 3 TABLETS EVERY EVENING FOR DIABETES Disp: 360 Rfl: 1   HUMULIN N  100 UNIT/ML SUBQ SUSP INJECT 24 UNITS SUBCUTANEOUSLY EVERY MORNING AND 35 UNITS EVERY EVENING FOR DIABETES AND MAY TREAT TO [employer] ADJUSTING DOSE AS NEEDED Disp: 80 Rfl: 3   SERTRALINE 100 MG ORAL TAB TAKE 1 TABLET BY MOUTH ONE TIME DAILY Disp: 60 Rfl: 1   OMEPRAZOLE 20 MG ORAL CPDR SR CAP TAKE 1 CAPSULE BY MOUTH TWICE DAILY FOR STOMACH Disp: 120 Rfl: 2   METFORMIN 1000 MG ORAL TAB TAKE 1 TABLET BY MOUTH TWICE DAILY FOR DIABETES Disp: 120 Rfl: 3   ALLOPURINOL 100 MG ORAL TAB TAKE 2 TABLETS BY MOUTH ONE TIME DAILY TO PREVENT GOUT Disp: 186 Rfl: 3   LOSARTAN  50 MG ORAL TAB TAKE 1 TABLET BY MOUTH ONE TIME DAILY Disp: 90 Rfl: 3   TRAZODONE  50 MG ORAL TAB TAKE 1/2 TO 2 TABLETS BY MOUTH EVERY NIGHT AT BEDTIME AS NEEDED FOR SLEEP Disp: 120 Rfl: 3   ATENOLOL  50 MG ORAL TAB TAKE 1 TABLET BY MOUTH TWICE DAILY FOR BLOOD PRESSURE Disp: 180 Rfl: 3   FUROSEMIDE 20 MG ORAL TAB TAKE 1 TO 2 TABLETS BY MOUTH ONCE A  DAY PRN FOR FLUID RETENTION Disp: 100 Rfl: 2   BD INSULIN SYRINGE ULT-FINE II 1/2  ML 31 X 5/16" MISC SYRINGE USE AS DIRECTED FOR DIABETES Disp: 100 Rfl: 5   SIMVASTATIN 40 MG ORAL TAB TAKE 1 TABLET BY MOUTH EVERY NIGHT AT BEDTIME Disp: 60 Rfl: 5   MECLIZINE 25 MG ORAL TAB 1/2 to 1 tablet three times daily as needed for vertigo Disp: 90 Rfl: 0   ASPIRIN   81 MG ORAL TBEC DR TAB 1 TAB PO DAILY Disp: 100 Rfl: 0       Past Medical History   Diagnosis Date   ??? SEVERE OBESITY (BMI >= 40) 07/09/2004     MORBID OBESITY   ??? ACTINIC KERATOSIS. 02/12/2003     ACTINIC KERATOSIS   ??? DM 2, UNCONTROLLED, W DIABETIC POLYNEUROPATHY.    ??? ESSENTIAL HTN    ??? HYPERLIPIDEMIA    ??? GOUT    ??? 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 PANIC DISORDER W AGORAPHOBIA 07/22/2010   ??? GERD (GASTROESOPHAGEAL REFLUX DISEASE)    ??? INTERNAL HEMORRHOID 08/07/2012   ??? HX OF COLONIC POLYP 08/07/2012                                                 Past Surgical History   Procedure Date   ??? Lasik    ??? Past surgical history, other      appendectomy   ??? Past  surgical history, other      Tonsillectomy   ??? Past surgical history, other      D&C        History     Social History   ??? Marital Status: Unknown     Spouse Name: Married     Number of Children: N/A   ??? Years of Education: N/A     Occupational History   ??? 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 Smoking status: Former Smoker -- 0.5 packs/day for 20 years     Quit date: 11/13/1993   ??? Smokeless tobacco: Never Used   ??? Alcohol Use: No   ??? Drug Use: No   ??? Sexually Active: Not Currently -- Female partner(s)      ascus (=) HPV with cryo     Other Topics Concern   ??? Not on file     Social History Narrative   ??? No narrative on file    reviewed by CMA    Family History   Problem Relation Age of Onset   ??? Other [Other] [OTHER] Mother      62 Years of Alzheimer's Disease and Living  with HTN, Thyroid problems   ??? Diabetes Father      Deceased With Complications, Dialysis patient   ??? Hypothyroidism Paternal Aunt      Hx of Grave's Disease   ??? Other [Other] [OTHER] Paternal Aunt      Neuropathy unrelated to Diabetes   ??? Other [Other] [OTHER] Brother      Overweight with HX of DVT   ??? Breast Cancer Maternal Aunt    ??? Colon Cancer Maternal Aunt      x4   ??? Ovarian Cancer None    ??? Uterine Cancer None    ??? Diabetes Maternal Grandmother    ??? Asthma Son    ??? narcopelsy [Other] [OTHER] Brother    ???  narcolepsy [Other] [OTHER] Maternal Grandmother    ??? narcolepsy [Other] [OTHER] Maternal Uncle    ??? Alzheimers Disease Maternal Uncle                 PATIENT CAN PERFORM THE FOLLOWING:  Walk a block or two on level ground (2.75 METs). Denies chest pain, denies shortness of breath.    REVIEW OF SYSTEMS:    GENERAL:   Denies  fevers, chills, unexplained weight loss, and fatigue.    CNS:   Admits intermittent dizziness X several years. Pt states resolves with meclizine. Last episode approximately 3 months ago.  Denies  stroke, denies TIAs, denies  migraines, denies  seizures.  No  recent syncopal  episodes, paresis, weakness, tremors, or paraesthesias.   No h/o MS, no h/o Myasthenia Gravis.    EENT:  History of  B/L age related cataracts, stable.  History of sensorineural hearing loss, stable.   History of tinnitus, intermittent. Worsening. (TE sent to PCP.)  Denies epistaxis.  Denies odynophagia and dysphagia.    RESP:   Denies asthma.  Denies  COPD, bronchitis, and emphysema.  Denies TB.   Patient denies cough, denies DOE  Denies pneumonia < 4 weeks ago.  Denies URI/URI sx's < 2 weeks ago.  Tobacco use = Former, quit in 1995    Chest Radiograph 04/19/11:  "IMPRESSION:  1. Chronic pulmonary markings without radiographic evidence of acute   cardiopulmonary disease.   2. Old granulomatous disease.   3. No significant interval change." Dr. Junita Push, MD  CT Thorax 04/19/11  "IMPRESSION:   1. No CT evidence of acute intrathoracic pathology.   2. Old granulomatous disease.   3. Fatty replaced liver." Dr. Junita Push, MD  ____________________________________________  OSA Screening:  Known dx of OSA: No.  However, during sleep study on 06/24/12 Pt's O2 sat dropped to 71%. See study below.  Severe/loud snoring:  NO  Daytime sleepiness: NO  Frequent nighttime waking: NO  BMI greater than 35: Yes. Estimated Body mass index is 41.82 kg/(m^^2) as calculated from the following:    Height as of this encounter: 5\' 7" (1.702 m).    Weight as of this encounter: 267 lb(121.11 kg)  Hypertension: Yes.  Age over 13: Yes.  Female (risk factor) : No.  Apneic episodes/cessation of breathing during sleep: NO  CPAP Setting: Pt does not use CPAP    Sleep Study 06/24/12:  "[name] (D.O.) 06/28/2012 3:16 PM Signed   Sleep history: The [age] is [age] year old female with snoring. The study started at 22:26:12 and ended at 05:33:38. Total sleep time was 333 minutes resulting in a sleep efficiency of 79%. There were 10 awakenings with a total time awake after sleep onset of 50 minutes. The sleep latency was 39 minutes and the REM  latency was 162 minutes. The patient spent 50.2% of sleep time in the supine position. The sleep stage percentages were 5.7% stage 1, 49.0% stage 2, 30.0% stages 3 & 4 and 15.3% REM sleep. There were 16 arousals, resulting in an arousal index of 2.9. There were 42 stage shifts. Light snoring was noted. There were 19 respiratory events consisting of 0 apneas (0 obstructive, 0 central, 0 mixed) and 19 hypopneas. The apnea-hypopnea index was 3.4. The mean oxygen saturation during the study was 92.0%, with a minimum oxygen saturation of 71.0%. The patient spent 16.1% of sleep time with an oxygen saturation below 90%. The PLM index is 0.  ICSD DIAGNOSIS:  Snoring  IMPRESSION:  The overal AHI and PLM indices are within normal limits. There is no evidence of obstructive sleep apnea or periodic limb movement disorder.   INTERPRETING PHYSICIAN:  Dr. Jenene Slicker  Diplomate ABPN, certified in sleep medicine."    CARD:   History of hypertension, stable on med.  History of hyperlipidemia, stable on med.  Patient denies recent  angina, denies shortness of breath, denies orthopnea, denies paroxysmal dyspnea, denies recent MI, denies  valvular disease.   Denies  DVT or PE.   Denies rheumatic fever, denies scarlet fever.  Denies  PVD.   Denies palpitations.   Denies lower extremity swelling.    ECG 05/10/12:  "Ventricular Rate 77 BPM  Atrial Rate 77 BPM  P-R Interval 184 ms  QRS Duration 92 ms  QT 416 ms  QTc 470 ms  P Axis 61 degrees  R Axis 78 degrees  T Axis 61 degrees  Diagnosis Line Normal sinus rhythm   Incomplete right bundle branch block  Poor R wave progression  Abnormal ECG  When compared with ECG of 06-Mar-2012 10:02,  Borderline criteria for Anterior myocardial infarction are now Present  Borderline criteria for Anterolateral infarction are now Present  Nonspecific T wave abnormality now evident in Lateral leads  Confirmed by DAS M.D., Lorenda Peck [phone]) on 05/10/2012 "    Stress Echo: 05/08/12:  "Summary  RESTING  ECHO:    Technically difficult study with limited visualization.    Normal left ventricular size and function. The estimated left ventricular  ejection fraction is 60%. Subtle focal wall motion abnormalities cannot be  excluded due to limited image quality.     No hemodynamically significant valve disease.    STRESS ECHO:    Patient exercised for 5:04 minutes and achieved 148 BPM which represents 93%  of her age predicted maximum heart rate and a workload of 6.10 METS.    Post stress the LV cavity size decreases.     All wall segments increase in contractility.     No new wall motion abnormalities are seen.    Impression:     Stress echo is negative for exercise induced ischemia at 93% of age  predicted maximal heart rate and 6.10 METS of work." Ashwath Mahi MD    FULL ECHO REPORT 05/08/12  "M-Mode/2D Measurements    LV Diastolic Dimension: 4.21 cm (3.6 LV Systolic Dimension: 2.7 cm (2.3  * 5.2 cm) * 3.9 cm)  LV Septum Diastolic: 1.09 cm (0.6 *  1.1 cm) AO Root Dimension: 3.3 cm (2.0 *  LV PW Diastolic: 1.3 cm (0.6 * 1.1 3.7 cm)  cm) LA Dimension: 3.75 cm (1.9 * 4.0  cm)    EF Estimated: 60 %      Findings:  Structures:  LEFT VENTRICLE: Normal left ventricular size and function. The estimated  left ventricular ejection fraction is 60%. Subtle focal wall motion  abnormalities cannot be excluded due to limited image quality.  RIGHT VENTRICLE: The right ventricle was not clearly visualized  LEFT ATRIUM: Normal left atrium.  RIGHT ATRIUM: Normal right atrium.  MISCELLANEOUS: Estimated left ventricular end diastolic pressure (LVEDP) is  normal (based on the pulmonary vein doppler).  PERICARDIAL EFFUSION: No evidence of pericardial effusion.  Valves:  AORTIC VALVE: The aortic valve leaflets were not well visualized.  MITRAL VALVE: Normal mitral valve structure and function.  TRICUSPID VALVE: Normal tricuspid valve structure and function  PULMONIC VALVE: The pulmonic valve was not well visualized    Summary  Technically  difficult  study with limited visualization.    Normal left ventricular size and function. The estimated left ventricular  ejection fraction is 60%. Subtle focal wall motion abnormalities cannot be  excluded due to limited image quality.     No hemodynamically significant valve disease." Hermenia Bers MD    Nuclear Stress Test 01/04/05  "IMPRESSION:    Reversible ischemia of the mid to distal anterior wall." Lady Gary MD    GI:    See HPI.  History of internal hemorrhoid, stable.  History of GERD, stable on med.  Denies PUD, denies liver disease/ hepatitis  Denies  abdominal pain, N/V/D/C.  Denies  melena and hematochezia.    GU / RENAL:   No history of UTI < 6 months.  No  history of nephrolithiasis.  No  dysuria. No  urethral/vaginal discharge.  No  Hematuria.  Postmenopausal.    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  Denies  thyroid problems, denies steroid use.  Home glucose, preprandial: 112    Component 12/08/2011 02/13/2012 06/13/2012   CHOL 207 (H) 141 124   TRIG 217 (H)  165 (H)   HDL 35 (L) 37 (L) 34 (L)   LDL DIRECT 143 (H) 86 81   HGBA1C 8.2 (H) 7.8 (H) 6.9 (H)     Component 12/08/2011   GLUC FAST 154 (H)     HEME:   Patient denies  bleeding/bruising easily.  Denies gum bleeding. Denies frequent nosebleeds.  No history of  anemia, no history of  prior transfusion.   Last aspirin = Today.      MUSCULOSKELETAL:    History of gout, stable on med.   Denies joint pain, denies muscle pain.    SKIN:  History of basal cell skin CA, stable.   Denies skin rashes/breaks in skin integrity.   Denies pruritis and dryness.    PSYCHIATRIC:    History of major depression, stable on med.  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 No recent panic attacks.  Denies suicidal ideation at this moment.  Denies illicit drug use, denies ETOH  abuse.    ANESTHESIA COMPLICATIONS:    History of severe PONV following colonoscopies.    Denies history of difficult intubation.    Denies history of severe headache.   Denies family history of malignant hyperthermia.    POST-OP COMPLICATIONS:   NONE.    ====================================================  PHYSICAL EXAM:  BP 135/59   Pulse 64   Temp(Src) 98.4 ??F (36.9 ??C) (Oral)   Resp 20   Ht 5\' 7"  (1.702 m)   Wt 267 lb (121.11 kg)   BMI 41.82 kg/m2   SpO2 95%. Estimated Body mass index is 41.50 kg/(m^2) as calculated from the following:    Height as of 09/20/12: 5\' 7" (1.702 m).    Weight as of 09/20/12: 265 lb(120.203 kg).  (Obtained by CMA).    GENERAL:   Pt appears visibly distressed and anxious during today's visit. States she is nervous about sleep study results and upcoming surgery.  Patient is alert, well appearing,  oriented x 3, cooperative.    SKIN:   Skin color, texture normal. No rashes or lesions.    HEENT:   Head: Normocephalic.  Eyes: PERRLA, EOMI, not icteric, no conjunctival injection. Pt wears glasses.  Nose: Patent nares.  Oropharynx: clear.  Dentition: Normal.  TMJ no clicks.  Mallampati classification:  II.    NECK:   FROM, no adenopathy, no bruits.  Thyroid smooth, not enlarged.  No JVD noted on exam.    CARDIAC:   S1, S2, RRR, no murmurs, rubs or gallops.    LUNGS:   Lungs clear to auscultation anterior/posterior  No wheezing, no rales, no rhonchi.    ABDOMEN:   Abdomen soft, non-tender, BS x4. No masses, no organomegaly.  No abdominal bruits appreciated on exam. No rebound tenderness.    EXTREMITIES:   FROM, no deformities, no cyanosis, no edema, no stasis changes.    NEURO:   Grossly normal cognition and motor function.  Cranial nerves II-XII grossly intact.     PULSES:  +2/4 radial  +2/4 posterior tibial bilateral.  ==================================================  IMPRESSION:  ASSESSMENT  Diagnosis   1. PREOP EXAM (V72.84)    2. SEVERE OBESITY (BMI >= 40) (278.01)    3. DM 2 (250.00)     4. DM 2 W DIABETIC PERIPHERAL NEUROPATHY (250.60, 357.2)    5. HYPERTENSION (401.9)    6. MAJOR DEPRESSION, RECURRENT (296.30)    7. PANIC DISORDER W AGORAPHOBIA (300.21)    8. GERD (GASTROESOPHAGEAL REFLUX DISEASE) (530.81)    9. INTERNAL HEMORRHOID (455.0)    10. HX OF COLONIC POLYP (V12.72)    11. HX OF BASAL CELL CA OF SKIN (V10.83)        PLAN:  Orders Placed This Encounter   ??? CBC  W/  DIFFERENTIAL, AUTO [85025B]   ??? Comprehensive metabolic panel (Na,K,Cl,CO2,BUN,Cr,glu,total Ca, alb,TBili,TProt,ALT,AST,ALKP) [80053A]   ??? TYPE (ABORH) AND ANTIBODY SCREEN PANEL [562130]   ??? ELECTROCARDIOGRAM, ROUTINE, W AT LEAST 12 LEADS, INTERP AND RPT [93000B]       Pt appears extremely anxious at today's visit regarding her upcoming surgery and sleep study results (06/2012). Pt states " I am supposed to receive a breathing treatment before my surgery, but I am not sure if Dr. Benjaman Kindler knows. We didn't talk about it at the last visit."  TE sent to Dr. Benjaman Kindler regarding Pt's anxiety about upcoming surgery.   Pt given clear liquid diet instructions to follow day prior to surgery. Pt states she has SAGE cloths to use day prior and DOS. Pt understands proper use.   Instructions reviewed and issued to patient. Patient verbalizes understanding of instructions provided.  An after visit summary (AVS) was printed and given to the patient.     TE sent to PCP. Pt has upcoming appointment next Tuesday with Dr. Bobbye Morton. Pt has been experiencing dizziness and tinnitus every few months. She has been using Meclizine for years. Pt needs further evaluation.   Pt advised:  PLEASE BE AWARE THAT IF YOU DEVELOP AN OPEN WOUND, ABRASION, OR RASH BETWEEN TODAY AND SURGERY ON AREA THAT IS UNDERGOING SURGERY, YOU MUST INFORM YOUR SURGEON OR OUR DEPARTMENT. THIS WOULD BE CAUSE FOR CANCELLATION

## 2012-10-01 NOTE — Telephone Encounter (Addendum)
Good Morning Dr. Bobbye Morton,    Yesterday I saw your new patient, Eileen Barnes in PAT for an upcoming gastric sleeve surgery she has scheduled with Dr. Benjaman Kindler on 10/14/12. She has an office visit scheduled with you next Tues, 10/08/12. I wanted to give you an FYI that she has been experiencing episodes of dizziness every few months for the past several years. She takes meclizine for her symptoms, but has not had further evaluation. The patient also c/o worsening tinnitus over the the last 2 months. The patient was extremely anxious about her upcoming surgery. I told her that I would send a note to you so that she would not forget to discuss these symptoms.    Thank you,    Meredith Leeds, PA-C

## 2012-10-02 NOTE — Progress Notes (Addendum)
SUBJECTIVE:    Eileen Barnes, is a 61 year old, female Type 2 DM identified for Diabetes Care management.   Spoke with pt today. Patient denies  ADR's and states adherence to medications.   Pt denies  hypoglycemic incidents.     SMBG :   Fasting am  11/18 112   Metformin 500 mg two twice daily, glyburide 5mg  one in am and 3 in pm and insulin 25 units am and 47 units pm.   Having gastric bypass Oct 14, 2012.     reports that she quit smoking about 18 years ago. She has never used smokeless tobacco.    Current Outpatient Prescriptions   Medication Sig Dispense Refill   ??? VANIQA 13.9 % TOP CREA APPLY THIN FILM TWICE DAILY TO AFFECTED AREAS  30  6   ??? METFORMIN  500 MG ORAL TAB TAKE 2 TABLETS BY MOUTH TWICE DAILY WITH FOOD FOR DIABETES  360  3   ??? ONE TOUCH ULTRA TEST MISC STRIPS USE AS DIRECTED FOR DIABETES  100  5   ??? GLYBURIDE 5 MG ORAL TAB TAKE 1 TABLET BY MOUTH EVERY MORNING AND TAKE 3 TABLETS EVERY EVENING FOR DIABETES  360  1   ??? HUMULIN N 100 UNIT/ML SUBQ SUSP INJECT 24 UNITS SUBCUTANEOUSLY EVERY MORNING AND 35 UNITS EVERY EVENING FOR DIABETES AND MAY TREAT TO TARGET ADJUSTING DOSE AS NEEDED  80  3   ??? SERTRALINE 100 MG ORAL TAB TAKE 1 TABLET BY MOUTH ONE TIME DAILY  60  1   ??? OMEPRAZOLE 20 MG ORAL CPDR SR CAP TAKE 1 CAPSULE BY MOUTH TWICE DAILY FOR STOMACH  120  2   ??? METFORMIN 1000 MG ORAL TAB TAKE 1 TABLET BY MOUTH TWICE DAILY FOR DIABETES  120  3   ??? ALBUTEROL SULFATE 2.5 MG /3 ML (0.083 %) INHL NEB SOLN USE 1 VIAL VIA NEBULIZER AS DIRECTED  1  0   ??? ALLOPURINOL 100 MG ORAL TAB TAKE 2 TABLETS BY MOUTH ONE TIME DAILY TO PREVENT GOUT  186  3   ??? LOSARTAN  50 MG ORAL TAB TAKE 1 TABLET BY MOUTH ONE TIME DAILY  90  3   ??? TRAZODONE  50 MG ORAL TAB TAKE 1/2 TO 2 TABLETS BY MOUTH EVERY NIGHT AT BEDTIME AS NEEDED FOR SLEEP  120  3   ??? ATENOLOL  50 MG ORAL TAB TAKE 1 TABLET BY MOUTH TWICE DAILY FOR BLOOD PRESSURE  180  3   ??? FUROSEMIDE 20 MG ORAL TAB TAKE 1 TO 2 TABLETS BY MOUTH ONCE A  DAY PRN FOR FLUID RETENTION   100  2   ??? BD INSULIN SYRINGE ULT-FINE II 1/2 ML 31 X 5/16" MISC SYRINGE USE AS DIRECTED FOR DIABETES  100  5   ??? SIMVASTATIN 40 MG ORAL TAB TAKE 1 TABLET BY MOUTH EVERY NIGHT AT BEDTIME  60  5   ??? MECLIZINE 25 MG ORAL TAB 1/2 to 1 tablet three times daily as needed for vertigo  90  0   ??? ASPIRIN   81 MG ORAL TBEC DR TAB 1 TAB PO DAILY  100  0   ??? VITAMIN D-3 2000 UNIT ORAL TAB Take 1-1-1/2 tabs po qd  45  3         OBJECTIVE:    Last PCP OV: 04/09/12    LAST DIABETIC RETINAL EXAM (Ordered Date): 05/31/11   Vitals:  BP Readings from Last 3 Encounters:  09/30/12 135/59   09/05/12 138/80   05/09/12 134/66       Labs:      Diabetes 12/08/2011 02/13/2012 06/13/2012 09/30/2012   HBA1c 8.2 (H) 7.8 (H) 6.9 (H) 6.6 (H)   Fasting Glucose 154 (H)      Cholesterol 207 (H) 141 124 137   Triglyceride 217 (H)  165 (H)    LDL Direct 143 (H) 86 81 81   HDL 35 (L) 37 (L) 34 (L) 35 (L)   Creatinine 0.57   0.67   ALT 47 42  47   Potassium 4.3   4.2   Microalbumin/CR 17.0 <30.8 (H) <3.0 <4.5         Immunization History   Administered Date(s) Administered   ??? INF (Influenza) unspecified formulation 09/24/1991, 08/25/1992, 10/30/2000, 10/17/2001, 10/04/2002, 09/15/2003, 09/08/2004   ??? INF H1N1-09 standard dose (Influenza H1N1-09). 11/03/2008   ??? INFs 31yrs and over (Influenza) 08/28/2008, 08/31/2010   ??? INFs 21yrs and over (influenza) 11/29/2007, 09/14/2012   ??? INFs pres free 28yrs-adult (Influenza) 09/16/2011   ??? PNUps (Pneumococcal polysaccharide, pneumonia) 10/10/2006   ??? Td 74yrs-adult (Tetanus, diphtheria) 04/07/1993, 10/10/2006       Allergies   Allergen Reactions   ??? Codeine And Opiate Derivatives Nausea and/or Vomiting   ??? Kiwi Fruit      Pt. States throat closes up    ??? Lisinopril      Cough     ??? Lovastatin    ??? Pamelor (Nortriptyline)      Severe constipation   ??? Talwin (Pentazocine Lactate) Nausea and/or Vomiting   ??? Tramadol Hcl Nausea and/or Vomiting   ??? Vicodin (Acetaminophen-Hydrocodone)      Vomiting           ASSESSMENT    1.  DM: A1C goal <7.5%, pt is controlled  2. Hyperlipidemia:LDL goal <100 mg/dl, pt is controlled  3. Hypertension: BP goal <140/90 mm HG, pt is controlled   4. A-L-L meds currently taking: ASA yes, statin yes, Lisinopril yes  5. Diabetic retinopathy? Dilated eye exam not needed.    PLAN:    1.Medications:     SMBG :   Fasting am  11/18 112   Metformin 500 mg two twice daily, glyburide 5mg  one in am and 3 in pm and insulin 25 units am and 47 units pm.   Having gastric bypass Oct 14, 2012.    2. Labs: done 09/30/12    3. Follow up by Care Manager as needed     4. Education given to pt: reviewed labs and medications

## 2012-10-02 NOTE — Telephone Encounter (Addendum)
Called and pt informed.

## 2012-10-07 NOTE — Patient Instructions (Addendum)
Thank you for allowing [place] to provide your care today. We hope all your concerns were met at this visit.    You may receive a brief survey in the mail within the next few weeks concerning this office visit. Your feedback is an opportunity to comment on the service experience with your care team.  Thank you for taking your time to complete and return the survey.  Your feedback is valuable to [place].    Keith Rake, CMA

## 2012-10-08 NOTE — Progress Notes (Addendum)
SUBJECTIVE:  61 year old female for follow up of diabetes. Diabetic Review of Systems - medication compliance:  compliant all of the time, diabetic diet compliance:  compliant all of the time, home glucose monitoring: is performed regularly.  Other symptoms and concerns: patient have long standing history of vertigo and is getting worse and more frequent in the last few month  She is also schedule for gastric bypass in 2 weeks.    Current Outpatient Prescriptions   Medication Sig Dispense Refill   ??? VANIQA 13.9 % TOP CREA APPLY THIN FILM TWICE DAILY TO AFFECTED AREAS  30  6   ???  500 MG ORAL TAB TAKE 2 TABLETS BY MOUTH TWICE DAILY WITH FOOD FOR DIABETES  360  3   ??? ONE TOUCH ULTRA TEST MISC STRIPS USE AS DIRECTED FOR DIABETES  100  5   ??? GLYBURIDE 5 MG ORAL TAB TAKE 1 TABLET BY MOUTH EVERY MORNING AND TAKE 3 TABLETS EVERY EVENING FOR DIABETES  360  1   ??? HUMULIN N 100 UNIT/ML SUBQ SUSP INJECT 24 UNITS SUBCUTANEOUSLY EVERY MORNING AND 35 UNITS EVERY EVENING FOR DIABETES AND MAY TREAT TO TARGET ADJUSTING DOSE AS NEEDED  80  3   ??? SERTRALINE 100 MG ORAL TAB TAKE 1 TABLET BY MOUTH ONE TIME DAILY  60  1   ??? OMEPRAZOLE 20 MG ORAL CPDR SR CAP TAKE 1 CAPSULE BY MOUTH TWICE DAILY FOR STOMACH  120  2   ??? METFORMIN 1000 MG ORAL TAB TAKE 1 TABLET BY MOUTH TWICE DAILY FOR DIABETES  120  3   ??? ALLOPURINOL 100 MG ORAL TAB TAKE 2 TABLETS BY MOUTH ONE TIME DAILY TO PREVENT GOUT  186  3   ??? LOSARTAN  50 MG ORAL TAB TAKE 1 TABLET BY MOUTH ONE TIME DAILY  90  3   ??? TRAZODONE  50 MG ORAL TAB TAKE 1/2 TO 2 TABLETS BY MOUTH EVERY NIGHT AT BEDTIME AS NEEDED FOR SLEEP  120  3   ??? ATENOLOL  50 MG ORAL TAB TAKE 1 TABLET BY MOUTH TWICE DAILY FOR BLOOD PRESSURE  180  3   ??? FUROSEMIDE 20 MG ORAL TAB TAKE 1 TO 2 TABLETS BY MOUTH ONCE A  DAY PRN FOR FLUID RETENTION  100  2   ??? BD INSULIN SYRINGE ULT-FINE II 1/2 ML 31 X 5/16" MISC SYRINGE USE AS DIRECTED FOR DIABETES  100  5   ??? SIMVASTATIN 40 MG ORAL TAB TAKE 1 TABLET BY MOUTH EVERY NIGHT AT  BEDTIME  60  5   ??? MECLIZINE 25 MG ORAL TAB 1/2 to 1 tablet three times daily as needed for vertigo  90  0   ??? ASPIRIN   81 MG ORAL TBEC DR TAB 1 TAB PO DAILY  100  0   ??? ALBUTEROL SULFATE 2.5 MG /3 ML (0.083 %) INHL NEB SOLN USE 1 VIAL VIA NEBULIZER AS DIRECTED  1  0   ??? VITAMIN D-3 2000 UNIT ORAL TAB Take 1-1-1/2 tabs po qd  45  3       OBJECTIVE:  Appearance: alert, well appearing, and in no distress.  BP 138/68   Pulse 64   Temp(Src) 97.5 ??F (36.4 ??C) (Oral)   Resp 16   Ht 5\' 7"  (1.702 m)   Wt 266 lb (120.657 kg)   BMI 41.66 kg/m2    Exam: heart sounds normal rate, regular rhythm, normal S1, S2, no murmurs, rubs, clicks or gallops,  chest clear, no hepatosplenomegaly, no carotid bruits, feet: warm, good capillary refill      ASSESSMENT/PLAN:    DM 2 W DIABETIC PERIPHERAL NEUROPATHY  (primary encounter diagnosis)  Note:The current medical regimen is effective;  continue present plan and medications.  Patient schedule for gastric bypass and will follow up after to adjust insulin    VERTIGO  Note: stale   Plan: REFERRAL OTOLARYNGOLOGY      SCREENING MAMMOGRAM FOR BREAST CANCER    Plan: XR MAMMOGRAPHY SCREENING BILATERAL, 2 VIEWS         EACH BREAST, COMPUTER AIDED LESION DETECTION         SCREENING MAMMOGRAPHY

## 2012-10-17 ENCOUNTER — Telehealth

## 2012-10-17 NOTE — Telephone Encounter (Addendum)
Advise omeprazole doesn't come in powder form

## 2012-10-17 NOTE — Telephone Encounter (Addendum)
Mbr calling sts that she has recently undergone  Bariatric surgery. She is to take Omeprazole 20 mg tab BID however the pill is too large to digest. It was suggested that she requested the Powder form of the the Omeprazole . Mbr will HS [facility]  [facility]. Mbr can be reached per below if needed. Thanks     Telephone Information:   Home Phone [phone]   Work Phone Not on file.   Mobile [phone]     Patient advised of office/PCP has 24 - 48 business hours to return their call.

## 2012-10-17 NOTE — Telephone Encounter (Addendum)
A user error has taken place: encounter opened in error, closed for administrative reasons.

## 2012-10-17 NOTE — Progress Notes (Addendum)
SUBJECTIVE:    Eileen Barnes, is a 61 year old, female Type 2 DM identified for Diabetes Care management.   Spoke with pt today. Patient denies  ADR's and states adherence to medications.   Pt denies  hypoglycemic incidents.     SMBG: has been watching since bariatric surgery and sugars have been 100-130.  Has stopped glyburide, metformin and all insulin.     reports that she quit smoking about 18 years ago. Shehas never used smokeless tobacco.    Current Outpatient Prescriptions   Medication Sig Dispense Refill   ??? VANIQA 13.9 % TOP CREA APPLY THIN FILM TWICE DAILY TO AFFECTED AREAS  30  6   ??? METFORMIN  500 MGORAL TAB TAKE 2 TABLETS BY MOUTH TWICE DAILY WITH FOOD FOR DIABETES  360  3   ??? ONE TOUCH ULTRA TEST MISC STRIPS USE AS DIRECTED FOR DIABETES  100  5   ??? GLYBURIDE 5 MG ORAL TAB TAKE 1 TABLET BY MOUTH EVERY MORNING AND TAKE 3 TABLETS EVERY EVENING FOR DIABETES  360  1   ??? HUMULIN N 100 UNIT/ML SUBQ SUSP INJECT 24 UNITS SUBCUTANEOUSLY EVERY MORNING AND 35 UNITS EVERY EVENING FOR DIABETES AND MAY TREAT TO TARGET ADJUSTING DOSE AS NEEDED  80  3   ??? SERTRALINE 100 MG ORAL TAB TAKE 1 TABLET BY MOUTH ONE TIME DAILY  60  1   ??? OMEPRAZOLE 20 MG ORAL CPDR SR CAP TAKE 1 CAPSULE BY MOUTH TWICE DAILY FOR STOMACH  120  2   ??? METFORMIN 1000 MG ORAL TAB TAKE 1 TABLET BY MOUTH TWICE DAILY FOR DIABETES  120  3   ??? ALBUTEROL SULFATE 2.5 MG /3 ML (0.083 %) INHL NEB SOLN USE1 VIAL VIA NEBULIZER AS DIRECTED  1  0   ??? ALLOPURINOL 100 MG ORAL TAB TAKE 2 TABLETS BY MOUTH ONE TIME DAILY TO PREVENT GOUT  186  3   ??? LOSARTAN  50 MG ORAL TAB TAKE 1 TABLET BY MOUTH ONE TIME DAILY  90  3   ??? TRAZODONE  50 MG ORAL TAB TAKE 1/2 TO 2 TABLETS BY MOUTH EVERY NIGHT AT BEDTIME AS NEEDED FOR SLEEP  120  3   ??? ATENOLOL  50 MG ORAL TAB TAKE 1 TABLET BY MOUTH TWICE DAILY FOR BLOOD PRESSURE  180  3   ??? FUROSEMIDE 20 MG ORAL TAB TAKE 1 TO 2 TABLETS BY MOUTH ONCE A  DAY PRN FOR FLUID RETENTION  100  2   ??? BD INSULIN SYRINGE ULT-FINE II 1/2 ML 31 X  5/16" MISC SYRINGE USE AS DIRECTED FOR DIABETES  100  5   ??? SIMVASTATIN 40 MG ORAL TAB TAKE 1 TABLET BY MOUTH EVERY NIGHT AT BEDTIME  60  5   ??? MECLIZINE 25 MG ORAL TAB 1/2 to 1 tablet three times daily as needed for vertigo  90  0   ??? ASPIRIN   81 MG ORAL TBEC DR TAB 1 TAB PO DAILY  100  0   ??? VITAMIN D-3 2000 UNIT ORAL TAB Take 1-1-1/2 tabs po qd  45  3         OBJECTIVE:    Last PCP OV:10/07/12    LAST DIABETIC RETINAL EXAM (Ordered Date): 05/31/11   Vitals:  BP Readings from Last 3 Encounters:   10/07/12 138/68   09/30/12 135/59   09/05/12 138/80       Labs:     Diabetes 12/08/2011 02/13/2012 06/13/2012 09/30/2012  HBA1c 8.2 (H) 7.8 (H) 6.9 (H) 6.6 (H)   Fasting Glucose 154 (H)      Cholesterol 207 (H) 141 124 137   Triglyceride 217 (H)  165 (H)    LDL Direct 143 (H) 86 81 81   HDL 35 (L) 37 (L) 34 (L) 35 (L)   Creatinine 0.57   0.67   ALT 47 42  47   Potassium 4.3   4.2   Microalbumin/CR 17.0 <30.8 (H) <3.0 <4.5         Immunization History   Administered Date(s) Administered   ??? INF (Influenza) unspecifiedformulation 09/24/1991, 08/25/1992, 10/30/2000, 10/17/2001, 10/04/2002, 09/15/2003, 09/08/2004   ??? INF H1N1-09 standard dose (Influenza H1N1-09). 11/03/2008   ??? INFs 69yrs and over (Influenza) 08/28/2008, 08/31/2010   ??? INFs 48yrs and over (influenza) 11/29/2007, 09/14/2012   ??? INFs pres free 57yrs-adult (Influenza) 09/16/2011   ??? PNUps (Pneumococcal polysaccharide, pneumonia) 10/10/2006   ??? Td 55yrs-adult (Tetanus, diphtheria) 04/07/1993, 10/10/2006       Allergies   Allergen Reactions   ??? Codeine And Opiate Derivatives Nausea and/or Vomiting   ??? Kiwi Fruit      Pt. States throat closes up    ??? Lisinopril      Cough     ??? Lovastatin    ??? Pamelor (Nortriptyline)      Severe constipation   ??? Talwin (Pentazocine Lactate) Nausea and/or Vomiting   ??? Tramadol Hcl Nausea and/or Vomiting   ??? Vicodin (Acetaminophen-Hydrocodone)      Vomiting           ASSESSMENT    1. DM: A1C goal <7.5%, pt is controlled  2.  Hyperlipidemia:LDL goal <100 mg/dl, pt is controlled  3. Hypertension: BP goal <140/90 mm HG, pt is controlled   4. A-L-L meds currently taking: ASA yes, statin yes, Lisinopril yes  5. Diabetic retinopathy? Dilated eye exam not needed.    PLAN:    1.Medications:  SMBG: has been watching since bariatric surgery and sugars have been 100-130.  Has stopped glyburide, metformin and all insulin.    2. Labs: done 09/30/12    3. Follow up by Care Manager as needed.     4. Education given to pt: reviewed medications and need for smbg, agreeable.

## 2012-10-17 NOTE — Telephone Encounter (Addendum)
Spoke with mbr, adv. [facility] Pharmacist researching alternatives, will advise. Verbalizes understanding.

## 2012-10-18 NOTE — Telephone Encounter (Addendum)
Spoke to patient and advised, transferred to pharmacy regarding when it will be available.

## 2012-10-18 NOTE — Telephone Encounter (Addendum)
Medication ordered notify patient

## 2012-10-23 NOTE — Progress Notes (Addendum)
The patient, Romeo A Kimmons's, identity was verified by name.    10 day telephone post op sleeve gastrectomy.    Spoke with pt.    Diet: CIB w/one percent milk plus 1/2 scoop whey (15 g pro), 2 Muscle Milk lite (40 g), 1 cup Greek yogurt w/1 scoop whey (45) , 1 can chicken noodle soup-pureed and strained (10?), SF pudding.    Says off of all diabetes medications.    No GI complaints    Protein: 100 + g    Fluids: 64 oz.water    BGs 193 yesterday, 134 day before, day before 126    Vitamins: chewable Flintstones complete 2x, B12 5,000 mcg, caltrate 600 mg/day chewable    Not tolerating omeprazole capsule, was given powdered sodium bicarbonate from by PCP. Questioning whether she should take it.    Sees Dr. Benjaman Kindler on 12/12.    F/U: 11/21/12 sched for 1 mo F/U    Melynda Krzywicki L. Dayton Martes MS., RD., LD

## 2012-10-24 MED ORDER — PANTOPRAZOLE SODIUM 40 MG PO TBEC
40 MG | ORAL | Status: DC
Start: 2012-10-24 — End: 2014-02-12

## 2012-10-24 NOTE — Patient Instructions (Addendum)
Please follow up in one month.    Do you understand your plan of care today? Is there anything else I can help you with today? You may be getting a survey in the mail regarding your care today. If you do please take a few minutes to fill it out and mail it back to [place]. It would be greatly appreciated.

## 2012-10-24 NOTE — Progress Notes (Addendum)
The patient, Eileen Barnes, identity was verified by name and MRN.    Eileen Barnes is a status post bariatric surgery and is here for initial followup visit.  Weight loss is ok.  Protein intake is ok.  Fluid intake is ok.      Past Medical History   Diagnosis Date   ??? SEVERE OBESITY (BMI >= 40) 07/09/2004     MORBID OBESITY   ??? ACTINIC KERATOSIS. 02/12/2003     ACTINIC KERATOSIS   ??? DM 2, UNCONTROLLED, W DIABETIC POLYNEUROPATHY.    ??? ESSENTIAL HTN    ??? HYPERLIPIDEMIA    ??? GOUT    ??? 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 PANIC DISORDER W AGORAPHOBIA 07/22/2010   ??? GERD (GASTROESOPHAGEAL REFLUX DISEASE)    ??? INTERNAL HEMORRHOID 08/07/2012   ??? HX OF COLONIC POLYP 08/07/2012       Past Surgical History   Procedure Laterality Date   ??? Lasik     ??? Past surgical history, other       appendectomy   ??? Past surgical history, other       Tonsillectomy   ??? Past surgical history,other       D&C        Allergies   Allergen Reactions   ??? Codeine And Opiate Derivatives Nausea and/or Vomiting   ??? Kiwi Fruit      Pt. States throat closes up    ??? Lisinopril      Cough     ??? Lovastatin    ??? Pamelor (Nortriptyline)      Severe constipation   ??? Talwin (Pentazocine Lactate) Nausea and/or Vomiting   ??? Tramadol Hcl Nausea and/or Vomiting   ??? Vicodin (Acetaminophen-Hydrocodone)      Vomiting         ROS    Hair Loss no  Leg Cramps no  Skin Rash no  Abdominal pain no  Constipation no  Dry mouth no  Diarrhea no  Binge eating no  Memory loss no  Sweating no  Tremor no  Drowsiness no  Fatigue no  Insomnia no  Nausea/vomiting no  Depression no  Chest pain no  Shortness of breath no      PE:    BP 136/66   Pulse 61   Temp(Src) 98.2 ??F (36.8 ??C) (Oral)   Resp 16   Ht 5\' 7"  (1.702 m)   Wt 256 lb 9.6 oz (116.393 kg)   BMI 40.18 kg/m2    GENERAL APPEARANCE: alert, oriented, no acute distress and obese  SKIN: well hydrated, no lesions, surgical site examined and drain removed  ABDOMEN: soft, nontender, nondistended, no masses or  organomegaly  No evidence of hernia or infection.    VIT B12   Date Value Range Status   02/16/2006 519  180 - 914 PG/ML Final               DEFICIENT - LESS THAN 145 PG/ML         INTERMEDIATE - 145 TO 180 PG/ML       FOLATE   Date Value Range Status   02/16/2006 11.8  3.0 - 20.0 NG/ML Final               DEFICIENT LESS THAN 2.5 NG/ML         INDETERMINATE 2.5 - 3.0 NG/ML       No results found for this basename: IRON, IBC, FESAT, FERRITIN  WBC COUNT   Date Value Range Status   09/30/2012 11.2* 4.5-11.0 K/uL Final        HGB   Date Value Range Status   09/30/2012 13.6  12.0-16.0 GM/DL Final        HEMATOCRIT   Date Value Range Status   09/30/2012 41.6  36.0-48.0 % Final        PLATELETS, AUTOMATED COUNT   Date Value Range Status   09/30/2012 199  150-450 K/uL Final         POTASSIUM   Date Value Range Status   09/30/2012 4.2  3.5-5.0 mmol/L Final        SODIUM   Date Value Range Status   09/30/2012 143  135-150 mmol/L Final        CHLORIDE   Date Value Range Status   09/30/2012 102  99-111 mmol/L Final        CO2   Date Value Range Status   09/30/2012 29  22-31 mmol/L Final       ALT   Date Value Range Status   09/30/2012 47  9-52 U/L Final        AST   Date Value Range Status   09/30/2012 33  14-48 U/L Final        ALKALINE PHOSPHATASE   Date Value Range Status   09/30/2012 110  38-126 U/L Final      (NOTE)      Normal Range for Adults only 47 years old      and above.  The Normal Range has not been      validated for children less than 40 years      old.        BILIRUBIN, TOTAL   Date Value Range Status   09/30/2012 0.5  0.2-1.3 MG/DL Final       [id number]   Date Value Range Status   09/30/2012 6.6* 3.4 to 5.9 % Final      (NOTE)      All Ages:       A1c Value   Interpretation       < 5.7%    Non-diabetic       5.7-6.4%   Increased risk for DM       > or = 6.5%   Consistent with DM      07/19/11      Analysis Performed by [facility] Reference laboratory: [id number] [place] [address]          ASSESSMENT/PLAN:    AFTERCARE FOR BARIATRIC SURGERY  (primary encounter diagnosis)  Note: progressing well  HX OF SLEEVE GASTRECTOMY  Note: progressing well  Plan:PANTOPRAZOLE 40 MG ORAL TBEC DR TAB        To take in place of prilosec    DM 2  Note: Reports glucose 150-180 at home off medication.  Recommend continued monitoring, followup with PCP  GERD (GASTROESOPHAGEAL REFLUX DISEASE)  Note: has mild symptoms  SEVERE OBESITY (BMI >= 40)  Note: improving  HYPERTENSION  Note: on medication    FOLLOW-UP: 6 weeks postop

## 2012-10-30 ENCOUNTER — Encounter

## 2012-10-30 MED ORDER — SIMVASTATIN 40 MG PO TABS
40 MG | ORAL | Status: AC
Start: 2012-10-30 — End: 2013-10-30

## 2012-10-30 NOTE — Telephone Encounter (Addendum)
Mbr has mole on mbrs face that was pre-cancerous when mbr had this mole biopsied.  Mbr sts this mole is coming back and looks suspicious.   Mbr would like a screening appt sooner that April or a referral for outside of [facility] to Dr. Sharlene Motts [facility].  Mbr was seen by this doctor previously.  Thank you.  Mbrcan be reached at [phone]

## 2012-10-30 NOTE — Telephone Encounter (Addendum)
We received message. Unfortunately there is nothing sooner than April. I will forward this to the derm nurses who can assist with this referral. Thank you.

## 2012-10-31 NOTE — Telephone Encounter (Addendum)
I spoke to pt. Pt primary medicare-no referral needed. I provided pt with [facility] contact information. Pt will call to schedule with them. Pt thankful and had no further questions for me. Pt states she will keep her full body skin check appt in April with Dr. Lavonia Dana.

## 2012-11-05 NOTE — Telephone Encounter (Addendum)
TOC opened 11/05/12. DC from hospital 10/16/12. Had follow up with dietician and Dr Benjaman Kindler , no complaints. No TOC call necessary 20 days after dc.

## 2012-11-19 ENCOUNTER — Encounter

## 2012-11-19 ENCOUNTER — Ambulatory Visit

## 2012-11-19 NOTE — Progress Notes (Addendum)
FOLLOW UP    The patient, Eileen Barnes, identity was verified by name and MRN    Those attending session: patient    SYMPTOM SCREENS: Duration: since last visit continues to report mood "doing well... Life is good"; denies anxiety or depression; coping well; Denies vegetative symptoms; sleep good with Trazodone; continues Zoloft 100mg  po qd, Trazodone 50mg  po qhs, Denies side effects    Session content: Pleased with progress overall; had bariatric surgery 12/2 and please with 30 pound weight loss, increased ability to be more physically active; working with nutritionist and maintaining diet well and regular exercise; 55 year old son Madelaine Bhat independent since 02-2012, now engaged and likes fiance; he continues alcohol use but less; granddaughters doing well and spending time with them; reconciled with brother she had been estranged from and feel positive about the relationship especially with realistic expectations; to go to [facility] next month for another brother's wedding; marriage good.    MENTAL STATUS    Mental Status:appearance: appropriately dressed, appropriately groomed, good hygiene, light make-up, hair styled, affect: congruent with mood and appropriate, attitude: Cooperative, mood: calm, bright, easily verbal, cooperative, personable, speech: appropriate, thought content: no evidence of psychosis, thought process; logical, coherent, orientation: oriented in all spheres, memory: recent:  good and remote:  good, insight: fair, judgment: good, cognitive: intact and motor behavior: normal  ASSESSMENT    Suicide screen: denies current suicidal ideation, plan and intent    Homicide screen: denies homicidal ideation, plan and intent    CLINICAL ASSESSMENT    Clinical assessment: Mood symptoms continue improved and coping well overall with treatment and self intervention.    DIAGNOSIS:    AXIS I:   Diagnosis   1. 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 Client verbalizes understanding and agrees to above plan.    GOALS, ASSIGNMENTS    COUNSELING TIME: 20 minutes    Interventions in session: Gave positive feedback for efforts and progress. Reviewed and reinforced self care strategies and resources including physiological factors that impact mood symptoms and stress discussed and reinforced. Reinforced boundaries of control and responsibility and her role in meeting her needs. Reinforced focus an areas within his/her control. Educated about medications including options, risks, and benefits.    Goals: Continue to more effectively cope with mood symptoms and stress.    Assignments: As above. Continue Zoloft 100mg  po qd, Trazodone 50-100mg  po qd prn sleep    Pt verbalizes understanding of and agreement with tx recommendation and plan - yes    NEXT STEPS    Recommended next steps: call as needed; follow up with me 6 months and prn; follow up with Renita Papa as planned; continue post bariatric surgery supports and follow up

## 2012-11-19 NOTE — Progress Notes (Addendum)
The patient, Eileen Barnes's, identity was verified by name, MRN and address    SYMPTOMS/FUNCTIONAL IMPAIRMENT (emotional,social occupational, legal, behavioral, SI, HI):Mood euthymic. Pt is compliant w meds. Pt has been socializing with family & friends & had a good holiday season. Husband is retired as well. They are enjoying themselves. She had bariatric surgery on 10/14/12. She has lost 30 lbs & is pleased w her progress. She feels good & is able to do more things physically. She is looking forward to an active spring/summer doing things that she could not do in the past due to wgt. Denies SI/HI    Duration:n/a    OBSERVATIONS/MS:WNL    CURRENT ISSUES/TOPICS/STRESSORS:Pt updated me on her family. Oldest son Eileen Barnes is 36 yrs old - still married since the age of 62 yrs old. He is retired Engineer, technical sales at [employer]. He completed his degree in the service. He is a Corporate investment banker for [employer]. He did the same thing for the [employer] where he was in charge of the [address] in the [place]. He is up for promotion & earning a 6 digit salary. His oldest son who had a deadly form of leukemia as a baby is in remission. He is 19 yrs old, attends [employer] & wks 2 part-time jobs. His younger son is 31 yrs old & is doing well. Eileen Barnes is 62 yrs old - divorced from his 1st marriage but recently got engaged. He still continues to drink & smoke-he is an alcoholic but is drinking less. After 3 yrs of living w them, they helped him purchase a condo that he can afford. He completed his degree in June'13 in IT. He is employed for a [employer], community. He did get a small raise & they like his wk so he has a job for now. He has an 49 yr old daughter:Eileen Barnes from his marriage who he has shared parenting w. She is in the [employer]. He found out 3 yrs ago that he fathered [place] who is now 63 yrs old. He sees her 3 x/wk. Since she lives close to her, she has spent a lot of "alone time" w her. The sisters are bounded as they are  both only children & have no other sibs. She gives Eileen Barnes alone time as the girls compete for [employer]. She is doing well recovering from her surgery. She keeps a food diary & exercises almost daily.  She is ff the rules of eating protein 1st. She had the gastric sleeve opposed to the Rhuen-Y because she could tolerate no more "dumping" which she has experienced due to her IBS. She does get hungry & has a tendency to eat too quickly. She does not want to regain the wgt once lost. Her goal is 165 lbs which may not be realistic due to Eileen Barnes because of her age. She is going to try.     INTERVENTIONS:Assessed status & functioning                       Listened & provided feedback & support                       Validated feelings & reinforced efforts                        Attend the monthly support gp here  Try the gp that meets monthly at [facility] as well    ASSESSMENT/OUTCOMES (progress/impairment/effectiveness of interventions):Pt is stable emotionally. She is doing well w the bariatric program. Reminded her to eat slowly, keep food diary & exercise daily.    FINAL DX:MDD, Rec, Panic Dis w Agoraphobia    LOS 60 min    PLAN:take meds & fu w Bonita Quin, CNS in 6 mon   Self-care & wkly PPA   Use skills learned   FU PRN

## 2012-11-19 NOTE — Patient Instructions (Addendum)
Do you understand your plan of care today? Is there anything else I can help you with today? You may be getting a survey in the mail regarding your care today. If you do please take a few minutes to fill it out and mail it back to [place]. It would be greatly appreciated.      [address] [facility]  [address]  [place], [place] [address]  [phone]  Fax: [phone]

## 2012-11-21 NOTE — Progress Notes (Addendum)
Bariatric Surgery Nutrition Follow-Up    Post operative month: 5 weeks post sleeve gastrectomy    Informant: self    Current Outpatient Prescriptions   Medication Sig Dispense Refill   ??? SIMVASTATIN 40 MG ORAL TAB TAKE 1 TABLET BY MOUTH EVERY NIGHT AT BEDTIME  60  5   ??? SERTRALINE 100 MG ORAL TAB TAKE 1 TABLET BY MOUTH ONE TIME DAILY  60  1   ??? PANTOPRAZOLE 40 MG ORAL TBEC DR TAB TAKE 1 TABLET BY MOUTH ONE TIME DAILY FOR 8 WEEKS  56  2   ??? ZEGERID 40-1,680 MG ORAL PACK DISSOLVE CONTENTS OF ONE PACKET IN LIQUID AND DRINK ONE TIME DAILY  90  1   ??? VANIQA 13.9 % TOP CREA APPLY THIN FILM TWICE DAILY TO AFFECTED AREAS  30  6   ??? METFORMIN  500 MG ORAL TAB TAKE 2 TABLETS BY MOUTH TWICE DAILY WITH FOOD FOR DIABETES  360  3   ??? ONE TOUCH ULTRA TEST MISC STRIPS USE AS DIRECTED FOR DIABETES  100  5   ??? GLYBURIDE 5 MG ORAL TAB TAKE 1 TABLET BY MOUTH EVERY MORNING AND TAKE 3 TABLETS EVERY EVENING FOR DIABETES  360  1   ??? HUMULIN N 100 UNIT/ML SUBQ SUSP INJECT 24 UNITS SUBCUTANEOUSLY EVERY MORNING AND 35 UNITS EVERY EVENING FOR DIABETES AND MAY TREAT TO TARGET ADJUSTING DOSE AS NEEDED  80  3   ??? OMEPRAZOLE 20 MG ORAL CPDR SR CAP TAKE 1 CAPSULE BY MOUTH TWICE DAILY FOR STOMACH  120  2   ??? METFORMIN 1000 MG ORAL TAB TAKE 1 TABLET BY MOUTH TWICE DAILY FOR DIABETES  120  3   ??? ALBUTEROL SULFATE 2.5 MG /3 ML (0.083 %) INHL NEB SOLN USE 1 VIAL VIA NEBULIZER AS DIRECTED  1  0   ??? ALLOPURINOL 100 MG ORAL TAB TAKE 2 TABLETS BY MOUTH ONE TIME DAILY TO PREVENT GOUT  186  3   ??? LOSARTAN  50 MG ORAL TAB TAKE 1 TABLET BY MOUTH ONE TIME DAILY  90  3   ??? TRAZODONE  50 MG ORAL TAB TAKE 1/2 TO 2 TABLETS BY MOUTH EVERY NIGHT AT BEDTIME AS NEEDED FOR SLEEP  120  3   ??? ATENOLOL  50 MG ORAL TAB TAKE 1 TABLET BY MOUTH TWICE DAILY FOR BLOOD PRESSURE  180  3   ??? FUROSEMIDE 20 MG ORAL TAB TAKE 1 TO 2 TABLETS BY MOUTH ONCE A  DAY PRN FOR FLUID RETENTION  100  2   ??? BD INSULIN SYRINGE ULT-FINE II 1/2 ML 31 X 5/16" MISC SYRINGE USE AS DIRECTED FOR DIABETES   100  5   ??? MECLIZINE 25 MG ORAL TAB 1/2 to 1 tablet three times daily as needed for vertigo  90  0   ??? ASPIRIN   81 MG ORAL TBEC DR TAB 1 TAB PO DAILY  100  0   ??? [EXPIRED] VITAMIN D-3 2000 UNIT ORAL TAB Take 1-1-1/2 tabs po qd  45  3     States taking no diabetes meds at this time.    Fasting BGs 160-175, later in day 154    No current facility-administered medications for this visit.     Subjective: Feels good. Gets hungry. Diet: soft. Drinking water    Exercise:  30 min/day-treadmill, recumbent bike    Protein:  70-90 g    Fluids:64 - 72 oz  Vitamins:   Multivitamin: Flintstones complete 1-2x/day  Calcium: Y-chewable-amount ?     What kind? Thinks it's citrate-will double check   Vitamin B12: 1,000 mcg sublingual              Vitamin D 2,000 IU   Thiamin  Y - 100 mg              Iron: N   Omeprazole: Y    Vomiting: N    Constipation: N  Diarrhea: N  Dumping Syndrome: N    Eating behaviors:   How many meals a day: 3   How much food at a meal: 1/2 - 1 CUP   How long does it take to eat meals: 20 MINUTES   How long do you wait before drinking after meals: 30 MIN   Snack foods: CHEESE, SF PUDDING, SF JELLO, LUNCHMEAT  Objective:    Wt Readings from Last 3 Encounters:   11/19/12 238 lb (107.956 kg)   10/24/12 256 lb 9.6 oz (116.393 kg)   10/07/12 266 lb (120.657 kg)     Weight loss post operatively: 20 lbs  Wt before surgery: 266 lbs  Wt at BMI of 25 = 159 lbs  EBW : 107 lbs  Percentage of EBW lost = 18.6     Protein requirements: 70 - 90 g       Labs:  VIT B12   Date Value Range Status   02/16/2006 519  180 - 914 PG/ML Final               DEFICIENT - LESS THAN 145 PG/ML         INTERMEDIATE - 145 TO 180 PG/ML       ALBUMIN   Date Value Range Status   09/30/2012 4.5  3.5-5.0 GM/DL Final       HGB   Date Value Range Status   09/30/2012 13.6  12.0-16.0 GM/DL Final       FOLATE   Date Value Range Status   02/16/2006 11.8  3.0 - 20.0 NG/ML Final               DEFICIENT LESS THAN 2.5 NG/ML         INDETERMINATE 2.5 - 3.0  NG/ML       No results found for this basename: Ferritin         Assessment: Progressing well. Taking all rec vits/mins, meeting protein goals, meeting fluid goals. Kcal intake 859 - 1375 kcal/day over past 8 days-997, 859, 937, 981, 1,375, 1,108, 1,109, 1,066. Pt is going to discuss BGs with her PCP as they are not at target. Has seen [facility].    Plan: Labs at 3 mos, pt sees Dr. Benjaman Kindler next week.     Patient education:  Foods to avoid for first 6 mos post bariatric surgery    F/U: 3 mo post op nut appt sched, pt states plans to attend support group in February    Eileen Barnes L. Dayton Martes MS., RD., LD

## 2012-11-27 ENCOUNTER — Telehealth

## 2012-11-27 MED ORDER — METFORMIN HCL 500 MG PO TABS
500 MG | ORAL | Status: DC
Start: 2012-11-27 — End: 2013-10-23

## 2012-11-27 NOTE — Progress Notes (Addendum)
The patient, Eileen Barnes, identity was verified by name and MRN.    Eileen Barnes is a status post bariatric surgery and is here for 6 week followup visit.  Weight loss is good.  Protein intake is good.  Fluid intake is good.      Past Medical History   Diagnosis Date   ??? SEVERE OBESITY (BMI >=40) 07/09/2004     MORBID OBESITY   ??? ACTINIC KERATOSIS. 02/12/2003     ACTINIC KERATOSIS   ??? DM 2, UNCONTROLLED, W DIABETIC POLYNEUROPATHY.    ??? ESSENTIAL HTN    ??? HYPERLIPIDEMIA    ??? GOUT    ??? 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 PANIC DISORDER W AGORAPHOBIA 07/22/2010   ??? GERD (GASTROESOPHAGEAL REFLUX DISEASE)    ??? INTERNAL HEMORRHOID 08/07/2012   ??? HX OF COLONIC POLYP 08/07/2012       Past Surgical History   Procedure Laterality Date   ??? Lasik     ??? Past surgical history, other       appendectomy   ??? Past surgical history, other       Tonsillectomy   ??? Past surgical history, other       D&C    ??? 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 Codeine And Opiate Derivatives Nausea and/or Vomiting   ??? Kiwi Fruit      Pt. States throat closes up    ??? Lisinopril      Cough     ??? Lovastatin    ??? Pamelor (Nortriptyline)      Severe constipation   ??? Talwin (Pentazocine Lactate) Nausea and/or Vomiting   ??? Tramadol Hcl Nausea and/or Vomiting   ??? Vicodin (Acetaminophen-Hydrocodone)      Vomiting         ROS    Hair Loss no  Leg Cramps no  Skin Rash no  Abdominal pain no  Constipation no  Dry mouth no  Diarrhea no  Binge eating no  Memory loss no  Sweating no  Tremor no  Drowsiness no  Fatigue no  Insomnia no  Nausea/vomiting no  Depression no  Chest pain no  Shortness of breath no      PE:    BP 132/84   Pulse 60   Temp(Src) 97.7 ??F (36.5 ??C) (Oral)   Resp 14   Ht 5\' 7"  (1.702 m)   Wt 238 lb 3.2 oz (108.047 kg)   BMI 37.3 kg/m2    GENERAL APPEARANCE: alert, oriented, no acute distress and obese  SKIN: well hydrated, no lesions, surgical site examined  ABDOMEN: soft, nontender,  nondistended, no masses or organomegaly  No evidence of hernia or infection.    VIT B12   Date Value Range Status   02/16/2006 519  180 - 914 PG/ML Final               DEFICIENT - LESS THAN 145 PG/ML         INTERMEDIATE - 145 TO 180 PG/ML       FOLATE   Date Value Range Status   02/16/2006 11.8  3.0 - 20.0 NG/ML Final               DEFICIENT LESS THAN 2.5 NG/ML         INDETERMINATE 2.5 - 3.0 NG/ML       No results found for this basename: IRON,  IBC, FESAT, FERRITIN         WBC COUNT   Date Value Range Status   09/30/2012 11.2* 4.5-11.0 K/uL Final        HGB   Date Value Range Status   09/30/2012 13.6  12.0-16.0 GM/DL Final        HEMATOCRIT   Date Value Range Status   09/30/2012 41.6  36.0-48.0 % Final        PLATELETS, AUTOMATED COUNT   Date Value Range Status   09/30/2012 199  150-450 K/uL Final         POTASSIUM   Date Value Range Status   09/30/2012 4.2  3.5-5.0 mmol/L Final        SODIUM   Date Value Range Status   09/30/2012 143  135-150 mmol/L Final        CHLORIDE   Date Value Range Status   09/30/2012 102  99-111 mmol/L Final        CO2   Date Value Range Status   09/30/2012 29  22-31 mmol/L Final       ALT   Date Value Range Status   09/30/2012 47  9-52 U/L         AST   Date Value Range Status   09/30/2012 33  14-48 U/L Final        ALKALINE PHOSPHATASE   Date Value Range Status   09/30/2012 110  38-126 U/L Final      (NOTE)      Normal Range for Adults only 26 years old      and above.  The Normal Range has not been      validated for children less than 4 years      old.        BILIRUBIN, TOTAL   Date Value Range Status   09/30/2012 0.5  0.2-1.3 MG/DL Final       [id number]   Date Value Range Status   09/30/2012 6.6* 3.4 to 5.9 % Final      (NOTE)      All Ages:       A1c Value   Interpretation       < 5.7%    Non-diabetic       5.7-6.4%   Increased risk for DM       > or = 6.5%   Consistent with DM      07/19/11      Analysis Performed by [facility] Reference laboratory: [id number] [place]., [address]         ASSESSMENT/PLAN:    AFTERCARE FOR BARIATRIC SURGERY  (primary encounter diagnosis)  Note: progressing well  HX OF SLEEVE GASTRECTOMY  Note: progressing well  Plan: PREALBUMIN, COMPREHENSIVE METABOLIC PANEL         (NA,K,CL,CO2,BUN,CR,GLU,TOTAL CA,         ALB,TBILI,TPROT,ALT,AST,ALKP), VITAMIN B12, CBC        W DIFFERENTIAL, AUTO, FOLIC ACID, PLASMA, IRON         AND TIBC, FERRITIN, VITAMIN D, 25-HYDROXY        Will check labs before next visiti    SEVERE OBESITY (BMI >= 40)  Note: improving  HYPERTENSION  Note: remains on medications, except off lasix  GERD (GASTROESOPHAGEAL REFLUX DISEASE)  Note: minimal symptoms  DM 2  Note: off meds, BS in 160 range, likely will need oral medication  Plan: HEMOGLOBIN A1C  FOLLOW-UP: 3 months postop

## 2012-11-27 NOTE — Progress Notes (Addendum)
Patient called , saw surgeon this am post op bariatric surgery. Her fasting am smbg have been elevated, 166,179, 171, 192,169,180  . 11/27/2012 4:09 PM received a verbal order for start metformin 500 mg twice daily  from Dr. Bobbye Morton.  Order repeated back to Dr. Bobbye Morton and order confirmed.  Diagnosis to be associated with order dm, diagnosis confirmed.   Agrees to be called in 2 weeks for smbg.

## 2012-11-27 NOTE — Patient Instructions (Addendum)
Please have lab work done at least 3 to 5 days prior to appointment    Do you understand your plan of care today? Is there anything else I can help you with today? You may be getting a survey in the mail regarding your care today. If you do please take a few minutes to fill it out and mail it back to [place]. It would be greatly appreciated.

## 2012-11-28 NOTE — Progress Notes (Addendum)
Patient called back she was asking if metformin ordered. Advised metformin to pharmacy and agreeable to pick up.

## 2012-12-11 ENCOUNTER — Telehealth

## 2012-12-11 NOTE — Progress Notes (Addendum)
SUBJECTIVE:    Eileen Barnes, is a 62 year old, female Type 2 DM identified for Diabetes Care management.   Spoke with pt today. Patient denies  ADR's and states adherence to medications.   Pt denies  hypoglycemic incidents.     SMBG :    Fasting am 2 hr pc dinner  1/23 151  143  1/24 143  1/25 165  1/26 154  1/27 155  180  1/28 150  1/29 148   Has been on metformin 500 mg twice daily since 11/29/12.     reports that she quit smoking about 19 years ago. She has never used smokeless tobacco.    Current Outpatient Prescriptions   Medication Sig Dispense Refill   ??? METFORMIN  500 MG ORAL TAB TAKE 1 TABLET BY MOUTH TWICE DAILY WITH FOOD FOR DIABETES  180  3   ??? SIMVASTATIN 40 MG ORAL TAB TAKE 1 TABLET BY MOUTH EVERY NIGHT AT BEDTIME  60  5   ??? SERTRALINE 100 MG ORAL TAB TAKE 1 TABLET BY MOUTH ONE TIME DAILY  60  1   ??? PANTOPRAZOLE 40 MG ORAL TBEC DR TAB TAKE 1 TABLET BY MOUTH ONE TIME DAILY FOR 8 WEEKS  56  2   ??? ZEGERID 40-1,680 MG ORAL PACK DISSOLVE CONTENTS OF ONE PACKET IN LIQUID AND DRINK ONE TIME DAILY  90  1   ??? VANIQA 13.9 % TOP CREA APPLY THIN FILM TWICE DAILY TO AFFECTED AREAS  30  6   ??? ONE TOUCH ULTRA TEST MISC STRIPS USE AS DIRECTED FOR DIABETES  100  5   ??? GLYBURIDE 5 MG ORAL TAB TAKE 1 TABLET BY MOUTH EVERY MORNING AND TAKE 3 TABLETS EVERY EVENING FOR DIABETES  360  1   ??? HUMULIN N 100 UNIT/ML SUBQ SUSP INJECT 24 UNITS SUBCUTANEOUSLY EVERY MORNING AND 35 UNITS EVERY EVENING FOR DIABETES AND MAY TREAT TO TARGET ADJUSTING DOSE AS NEEDED  80  3   ??? OMEPRAZOLE 20 MG ORAL CPDR SR CAP TAKE 1 CAPSULE BY MOUTH TWICE DAILY FOR STOMACH  120  2   ??? METFORMIN 1000 MG ORAL TAB TAKE 1 TABLET BY MOUTH TWICE DAILY FOR DIABETES  120  3   ??? ALBUTEROL SULFATE 2.5 MG /3 ML (0.083 %) INHL NEB SOLN USE 1 VIAL VIA NEBULIZER AS DIRECTED  1  0   ??? ALLOPURINOL 100 MG ORAL TAB TAKE 2 TABLETS BY MOUTH ONE TIME DAILY TO PREVENT GOUT  186  3   ??? LOSARTAN  50 MG ORAL TAB TAKE 1 TABLET BY MOUTH ONE TIME DAILY  90  3   ??? TRAZODONE  50 MG  ORAL TAB TAKE 1/2 TO 2 TABLETS BY MOUTH EVERY NIGHT AT BEDTIME AS NEEDED FOR SLEEP  120  3   ??? ATENOLOL  50 MG ORAL TAB TAKE 1 TABLET BY MOUTH TWICE DAILY FOR BLOOD PRESSURE  180  3   ??? FUROSEMIDE 20 MG ORAL TAB TAKE 1 TO 2 TABLETS BY MOUTH ONCE A  DAY PRN FOR FLUID RETENTION  100  2   ??? BD INSULIN SYRINGE ULT-FINE II 1/2 ML 31 X 5/16" MISC SYRINGE USE AS DIRECTED FOR DIABETES  100  5   ??? MECLIZINE 25 MG ORAL TAB 1/2 to 1 tablet three times daily as needed for vertigo  90  0   ??? ASPIRIN   81 MG ORAL TBEC DR TAB 1 TAB PO DAILY  100  0   ??? [  EXPIRED] VITAMIN D-3 2000 UNIT ORAL TAB Take 1-1-1/2 tabs po qd  45  3     No current facility-administered medications for this visit.         OBJECTIVE:    Last PCP OV: appt sch 01/14/13.    LAST DIABETIC RETINAL EXAM (Ordered Date): 05/31/11   Vitals:  BP Readings from Last 3 Encounters:   11/27/12 132/84   11/19/12 125/67   10/24/12 136/66       Labs:     Diabetes 12/08/2011 02/13/2012 06/13/2012 09/30/2012   HBA1c 8.2 (H) 7.8 (H) 6.9 (H) 6.6 (H)   Fasting Glucose 154 (H)      Cholesterol 207 (H) 141 124 137   Triglyceride 217 (H)  165 (H)    LDL Direct 143 (H) 86 81 81   HDL 35 (L) 37 (L) 34 (L) 35 (L)   Creatinine 0.57   0.67   ALT 47 42  47   Potassium 4.3   4.2   Microalbumin/CR 17.0 <30.8 (H) <3.0 <4.5          Immunization History   Administered Date(s) Administered   ??? INF (Influenza) unspecified formulation 09/24/1991, 08/25/1992, 10/30/2000, 10/17/2001, 10/04/2002, 09/15/2003, 09/08/2004   ??? INF H1N1-09 standard dose (Influenza H1N1-09). 11/03/2008   ??? INFs 33yrs and over (Influenza) 08/28/2008, 08/31/2010   ??? INFs 74yrs and over (influenza) 11/29/2007, 09/14/2012   ??? INFs pres free 33yrs-adult (Influenza) 09/16/2011   ??? PNUps (Pneumococcal polysaccharide, pneumonia) 10/10/2006   ??? Td 36yrs-adult (Tetanus, diphtheria) 04/07/1993, 10/10/2006       Allergies   Allergen Reactions   ??? Codeine And Opiate Derivatives Nausea and/or Vomiting   ??? Kiwi Fruit      Pt. States throat closes  up    ??? Lisinopril      Cough     ??? Lovastatin    ??? Pamelor (Nortriptyline)      Severe constipation   ??? Talwin (Pentazocine Lactate) Nausea and/or Vomiting   ??? Tramadol Hcl Nausea and/or Vomiting   ??? Vicodin (Acetaminophen-Hydrocodone)      Vomiting           ASSESSMENT    1. DM: A1C goal <7.5%, pt is controlled  2. Hyperlipidemia:LDL goal <100 mg/dl, pt is controlled  3. Hypertension: BP goal <140/90 mm HG, pt is controlled   4. A-L-L meds currently taking: ASA no, statin yes, Lisinopril yes  5. Diabetic retinopathy? Dilated eye exam not needed.    PLAN:    1.Medications:   SMBG :    Fasting am 2 hr pc dinner  1/23 151  143  1/24 143  1/25 165  1/26 154  1/27 155  180  1/28 150  1/29 148   Has been on metformin 500 mg twice daily since 11/29/12.    2. Labs: ordered and agrees to have done    3. Follow up by Care Manager 12/25/12     4. Education given to pt: advised on diet and need for labs.

## 2012-12-25 ENCOUNTER — Telehealth

## 2012-12-26 NOTE — Progress Notes (Addendum)
Reviewed & would advise she continue current therapy for now & wait to see if further improvement occurs - C. Leonore Frankson CRNP

## 2012-12-26 NOTE — Progress Notes (Addendum)
SUBJECTIVE:   Eileen Barnes, is a 62 year old, female Type 2 DM identified for Diabetes Care management.   Spoke with pt today. Patient denies ADR's and states adherence to medications.  Pt denies hypoglycemic incidents.   SMBG :    Fasting am bedtime  2/5 166    2/6 164  138  2/7 168  2/8 153  2/9 150  2/10 140  2/11 137  2/12 154  Has been on metformin 500 mg twice daily since 11/29/12.   reports that she quit smoking about 19 years ago. She has never used smokeless tobacco.   Current Outpatient Prescriptions    Medication  Sig  Dispense  Refill    ???  METFORMIN 500 MG ORAL TAB  TAKE 1 TABLET BY MOUTH TWICE DAILY WITH FOOD FOR DIABETES  180  3    ???  SIMVASTATIN 40 MG ORAL TAB  TAKE 1 TABLET BY MOUTH EVERY NIGHT AT BEDTIME  60  5    ???  SERTRALINE 100 MG ORAL TAB  TAKE 1 TABLET BY MOUTH ONE TIME DAILY  60  1    ???  PANTOPRAZOLE 40 MG ORAL TBEC DR TAB  TAKE 1 TABLET BY MOUTH ONE TIME DAILY FOR 8 WEEKS  56  2    ???  ZEGERID 40-1,680 MG ORAL PACK  DISSOLVE CONTENTS OF ONE PACKET IN LIQUID AND DRINK ONE TIME DAILY  90  1    ???  VANIQA 13.9 % TOP CREA  APPLY THIN FILM TWICE DAILY TO AFFECTED AREAS  30  6    ???  ONE TOUCH ULTRA TEST MISC STRIPS  USE AS DIRECTED FOR DIABETES  100  5    ???  GLYBURIDE 5 MG ORAL TAB  TAKE 1 TABLET BY MOUTH EVERY MORNING AND TAKE 3 TABLETS EVERY EVENING FOR DIABETES  360  1    ???  HUMULIN N 100 UNIT/ML SUBQ SUSP  INJECT 24 UNITS SUBCUTANEOUSLY EVERY MORNING AND 35 UNITS EVERY EVENING FOR DIABETES AND MAY TREAT TO TARGET ADJUSTING DOSE AS NEEDED  80  3    ???  OMEPRAZOLE 20 MG ORAL CPDR SR CAP  TAKE 1 CAPSULE BY MOUTH TWICE DAILY FOR STOMACH  120  2    ???  METFORMIN 1000 MG ORAL TAB  TAKE 1 TABLET BY MOUTH TWICE DAILY FOR DIABETES  120  3    ???  ALBUTEROL SULFATE 2.5 MG /3 ML (0.083 %) INHL NEB SOLN  USE 1 VIAL VIA NEBULIZER AS DIRECTED  1  0    ???  ALLOPURINOL 100 MG ORAL TAB  TAKE 2 TABLETS BY MOUTH ONE TIME DAILY TO PREVENT GOUT  186  3    ???  LOSARTAN 50 MG ORAL TAB  TAKE 1 TABLET BY MOUTH ONE TIME  DAILY  90  3    ???  TRAZODONE 50 MG ORAL TAB  TAKE 1/2 TO 2 TABLETS BY MOUTH EVERY NIGHT AT BEDTIME AS NEEDED FOR SLEEP  120  3    ???  ATENOLOL 50 MG ORAL TAB  TAKE 1 TABLET BY MOUTH TWICE DAILY FOR BLOOD PRESSURE  180  3   ???  FUROSEMIDE 20 MG ORAL TAB  TAKE 1 TO 2 TABLETS BY MOUTH ONCE A DAY PRN FOR FLUID RETENTION  100  2    ???  BD INSULIN SYRINGE ULT-FINE II 1/2 ML 31 X 5/16" MISC SYRINGE  USE AS DIRECTED FOR DIABETES  100  5    ???  MECLIZINE 25 MG ORAL TAB  1/2 to 1 tablet three times daily as needed for vertigo  90  0    ???  ASPIRIN 81 MG ORAL TBEC DR TAB  1 TAB PO DAILY  100  0    ???  [EXPIRED] VITAMIN D-3 2000 UNIT ORAL TAB  Take 1-1-1/2 tabs po qd  45  3      No current facility-administered medications for this visit.    OBJECTIVE:   Last PCP OV: appt sch 01/14/13.   LAST DIABETIC RETINAL EXAM (Ordered Date): 05/31/11   Vitals:   BP Readingsfrom Last 3 Encounters:    11/27/12  132/84    11/19/12  125/67    10/24/12  136/66    Labs:   Diabetes  12/08/2011  02/13/2012  06/13/2012  09/30/2012    HBA1c  8.2 (H)  7.8 (H)  6.9 (H)  6.6 (H)    Fasting Glucose  154 (H)       Cholesterol  207 (H)  141  124  137    Triglyceride  217 (H)   165 (H)     LDL Direct  143 (H)  86  81  81    HDL  35 (L)  37 (L)  34 (L)  35 (L)    Creatinine  0.57    0.67    ALT  47  42      Potassium  4.3    4.2    Microalbumin/CR  17.0  <30.8 (H)  <3.0  <4.5      Immunization History    Administered  Date(s) Administered    ???  INF (Influenza) unspecified formulation  09/24/1991, 08/25/1992, 10/30/2000, 10/17/2001, 10/04/2002, 09/15/2003, 09/08/2004    ???  INF H1N1-09 standard dose (Influenza H1N1-09).  11/03/2008    ???  INFs 36yrs and over (Influenza)  08/28/2008, 08/31/2010    ???  INFs 34yrs and over (influenza)  11/29/2007, 09/14/2012    ???  INFs pres free 34yrs-adult (Influenza)  09/16/2011    ???  PNUps (Pneumococcal polysaccharide, pneumonia)  10/10/2006    ???  Td 26yrs-adult (Tetanus, diphtheria)  04/07/1993, 10/10/2006      Allergies    Allergen  Reactions     ???  Codeine And Opiate Derivatives  Nausea and/or Vomiting    ???  Kiwi Fruit       Pt. States throat closes up    ???  Lisinopril       Cough    ???  Lovastatin     ???  Pamelor (Nortriptyline)       Severe constipation    ???  Talwin (Pentazocine Lactate)  Nausea and/or Vomiting    ???  Tramadol Hcl  Nausea and/or Vomiting    ???  Vicodin (Acetaminophen-Hydrocodone)       Vomiting    ASSESSMENT   1. DM: A1C goal <7.5%, pt is controlled   2. Hyperlipidemia:LDL goal <100 mg/dl, pt is controlled   3. Hypertension: BP goal <140/90 mm HG, pt is controlled   4. A-L-L meds currently taking: ASA no, statin yes, Lisinopril yes   5. Diabetic retinopathy? Dilated eye exam not needed.   PLAN:   1.Medications:    SMBG :    Fasting am bedtime  2/5 166    2/6 164  138  2/7 168  2/8 153  2/9 150  2/10 140  2/11 137  2/12 154  Has been on metformin 500 mg twice daily since  11/29/12. Should she increase her metformin still losing weight down to 226 lbs now.  2. Labs: ordered andagrees to have done   3. Follow up by Care Manager 12/25/12   4. Education given to pt: advised on diet and need for labs.

## 2012-12-26 NOTE — Progress Notes (Addendum)
Advised and Agreeable.

## 2012-12-29 ENCOUNTER — Telehealth

## 2012-12-29 NOTE — Telephone Encounter (Addendum)
Barb from [facility] pharmacy calling for [employer] a 62 year old , calling for a refill on a prescription,req refill on Atenolol 50mg  tablets , 1 twice a day for blood pressure,  previously ordered by  C Smorag. Req message to provider.   Camera operator.   Pharmacy line [phone]        Triage Plan:  Demographics verified and Messages are responded to in 24-48 business hours.

## 2012-12-30 ENCOUNTER — Telehealth

## 2012-12-30 MED ORDER — ATENOLOL 50 MG PO TABS
50 MG | ORAL | Status: DC
Start: 2012-12-30 — End: 2013-10-15

## 2012-12-30 NOTE — Telephone Encounter (Addendum)
Medication reordered & may call Atenolol to [facility] - C. Naseer Hearn CRNP

## 2012-12-30 NOTE — Telephone Encounter (Addendum)
Second request , Med refill placed previously & phoned in  - C. Ethelreda Sukhu CRNP

## 2012-12-30 NOTE — Telephone Encounter (Addendum)
Rx called in and member made aware

## 2012-12-30 NOTE — Progress Notes (Addendum)
The patient, Eileen Barnes's, identity was verified by name.    Member attended Bariatric Support Group.  Panel included:Dietitian  Discussion included emphasis on lifelong and permanent changes such as correct eating behaviors, protein recommendations, vitamin supplementation and possible pitfalls of dining out .  Social aspects of Bariatric Surgery - self image, peer pressure and taking care of one's needs - were examined by members.    Nunzio Cory, MS, RD, LD

## 2012-12-30 NOTE — Telephone Encounter (Addendum)
Received fax from [facility] phone [phone].  Requesting refill for Atenolol 50 mg tab sig: take one tablet by mouth twice a day for blood pressure.

## 2012-12-31 NOTE — Progress Notes (Addendum)
Last ENT: ~ Five (5) years ago    Consult From: Dardir, Cathlean Sauer (M.D.) --- "Eileen Barnes is being sent to you for Consultation for Vertigo"        Chief Complaint   Patient presents with   ??? 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 Symptoms started ~ One (1) year ago w/ severe vertigo lasting about 2 - 3 weeks.    Initial Symptoms upon awaking; lasted 2 -3 weeks    Since, symptoms occur with movement, getting up suddenly, moving head to the right, lying down and rolling over and last for a much shorter time frame.     Frequency of the dizziness is --- last episode back in September.     Symptoms associated with dizziness include: no new hearing loss and no tinnitus.      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    Denies aural pressure, fulness or roaring tinnitus    There is no prior ear/head trauma, no prior ear surgery, no focal neurological symptoms, no migraine headaches and nothing to suggest Meniere's disease.         ALLERGIES: Codeine and opiate derivatives; Kiwi fruit; Lisinopril; Lovastatin; Pamelor; Talwin; Tramadol hcl; and Vicodin        Past Medical History   Diagnosis Date   ??? SEVERE OBESITY (BMI >= 40) 07/09/2004     MORBID OBESITY   ??? ACTINIC KERATOSIS. 02/12/2003     ACTINIC KERATOSIS   ??? DM 2, UNCONTROLLED, W DIABETIC POLYNEUROPATHY.    ??? ESSENTIAL HTN    ??? HYPERLIPIDEMIA    ??? GOUT    ??? 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 PANIC DISORDER W AGORAPHOBIA 07/22/2010   ??? GERD (GASTROESOPHAGEAL REFLUX DISEASE)    ??? INTERNAL HEMORRHOID 08/07/2012   ??? HX OF COLONIC POLYP 08/07/2012         Past Surgical History   Procedure Laterality Date   ??? Lasik     ??? Past surgical history, other       appendectomy    ??? Past surgical history, other       Tonsillectomy   ??? Past surgical history, other       D&C          History   Substance Use Topics   ??? Smoking status: Former Smoker -- 0.50 packs/day for 20 years     Quit date: 11/13/1993   ??? Smokeless tobacco: Used   ??? Alcohol Use: No         Review of Systems - General ROS: negative for - chills, fever or malaise  ENT ROS: negative for - headaches, sinus pain, visual changes or vocal changes  Neurological ROS: no TIA or stroke symptoms      OBJECTIVE:    General appearance: alert, well appearing, and in no distress and oriented to person, place, and time.    BP 125/67   Pulse 57   Temp(Src) 98 ??F (36.7 ??C) (Oral)   Resp 16   Ht 5\' 7"  (1.702 m)   Wt 238 lb (107.956 kg)   BMI 37.27 kg/m2    Voice:  normal. Language and Communication appropriate and fluent    Head and Face: symmetric and nontender.  Facial nn intact and equal bilaterally    Ears - Pinnae normal.  EAC's clear.  TM's normal and mobile.    Nose:  External Nose normal. Passage patent. No drainage, septum midline, turbinates non-edematous    ORO:  Lips, teeth, gums, oral cavity and oropharynx all WNL.  Tongue midline.  Palate elevates normally    Neck: Supple without nodes or masses.      Thyroid exam reveals thyroid is normal in size without nodules or tenderness.    Salivary Glands: normal    NEURO:   Alert and oriented  PERRL; EOMI without nystagmus  CN II-XII intact  Cerebellar, Finger to Nose Intact  Motor and Sensory Grossly Normal  Gait Steady  Negative Romberg  Dix-Hallpike: positive Head RIGHT, without latency, + fatigue and RIGHT Nystagmus         ASSESSMENT  Diagnosis   1. RIGHT BENIGN PAROXYSMAL POSITIONAL VERTIGO (386.11)        PLAN:  Orders Placed This Encounter   ??? REFERRAL THERAPY, PHYSICAL - [facility] for Appropriate Evaluation/Maneuvers/Therapy       Follow Up HS ENT prn

## 2013-01-16 NOTE — Patient Instructions (Addendum)
Thank you for trusting the [facility] Primary Care Team to provide your care today. We hope all your concerns were met and that we surpassed your expectations. Our goal is to provide you with EXCEPTIONAL service EVERY time.    You may receive a survey in the mail regarding today's visit. May I ask that you take a moment to complete your survey and return it in the enclosed postage-paid envelope. Your input matters and will help [place] to improve and maintain only the HIGHEST standards. We strive to achieve ONLY the excellent care option.    Loretta A. Mekus, LPN

## 2013-01-16 NOTE — Patient Instructions (Addendum)
Follow Up in 3 months.You will receive a notice in the mail

## 2013-01-16 NOTE — Progress Notes (Addendum)
The patient, Eileen Barnes, identity was verified by name and MRN.    Eileen Barnes is a status post bariatric surgery and is here for 3 month followup visit.  Weight loss is excellent.  Protein intake is excellent.  Fluid intake is excellent.  She feels well and is extremely happy with results so far..      Past Medical History   Diagnosis Date   ??? SEVERE OBESITY (BMI >= 40) 07/09/2004     MORBID OBESITY   ??? ACTINIC KERATOSIS. 02/12/2003     ACTINIC KERATOSIS   ??? DM 2, UNCONTROLLED, W DIABETIC POLYNEUROPATHY.    ??? ESSENTIAL HTN    ??? HYPERLIPIDEMIA    ??? GOUT    ??? 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 PANIC DISORDER W AGORAPHOBIA 07/22/2010   ??? GERD (GASTROESOPHAGEAL REFLUX DISEASE)    ??? INTERNAL HEMORRHOID 08/07/2012   ??? HX OF COLONIC POLYP 08/07/2012       Past Surgical History   Procedure Laterality Date   ??? Lasik     ??? Past surgical history, other       appendectomy   ??? Past surgical history, other       Tonsillectomy   ??? Past surgical history, other       D&C    ??? 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 Codeine And Opiate Derivatives Nausea and/or Vomiting   ??? Kiwi Fruit      Pt. States throat closes up    ??? Lisinopril      Cough     ??? Lovastatin    ??? Pamelor (Nortriptyline)      Severe constipation   ??? Talwin (Pentazocine Lactate) Nausea and/or Vomiting   ??? Tramadol Hcl Nausea and/or Vomiting   ??? Vicodin (Acetaminophen-Hydrocodone)      Vomiting         ROS    Hair Loss no  Leg Cramps no  Skin Rash no  Abdominal pain no  Constipation no  Dry mouth no  Diarrhea no  Binge eating no  Memory loss no  Sweating no  Tremor no  Drowsiness no  Fatigue no  Insomnia no  Nausea/vomiting no  Depression no  Chest pain no  Shortness of breath no      PE:    BP 137/73   Pulse 59   Temp(Src) 97.6 ??F (36.4 ??C) (Oral)   Resp 16   Ht 5\' 7"  (1.702 m)   Wt 228 lb 6.4 oz (103.602 kg)   BMI 35.76 kg/m2    GENERAL APPEARANCE: alert, oriented, no acute distress and obese  SKIN: well  hydrated  ABDOMEN: soft, nontender, nondistended, no masses or organomegaly    VIT B12   Date Value Range Status   01/13/2013 >1000* 239-931 PG/ML Final       FOLATE   Date Value Range Status   01/13/2013 >20.0 (NOTE) NORMAL ADULTS(AGED 18-65)   2.79->20.00  NG/ML  FOLATE DEFICIENT PATIENTS   1.04-2.78 NG/ML   Final       IRON   Date Value Range Status   01/13/2013 58  37-170 UG/DL Final        TOTAL IRON BINDING CAPACITY   Date Value Range Status   01/13/2013 370  265-497 UG/DL Final        IRON SAT   Date Value Range Status   01/13/2013 16  14-34 %  Final        FERRITIN   Date Value Range Status   01/13/2013 44.0  17.9-464 NG/ML Final         WBC COUNT   Date Value Range Status   01/13/2013 9.5  4.5-11.0 K/uL Final        HGB   Date Value Range Status   01/13/2013 13.8  12.0-16.0 GM/DL Final        HEMATOCRIT   Date Value Range Status   01/13/2013 41.8  36.0-48.0 % Final        PLATELETS, AUTOMATED COUNT   Date Value Range Status   01/13/2013 173  150-450 K/uL Final         POTASSIUM   Date Value Range Status   01/13/2013 4.0  3.5-5.0 mmol/L Final        SODIUM   Date Value Range Status   01/13/2013 149  135-150 mmol/L Final        CHLORIDE   Date Value Range Status   01/13/2013 106  99-111 mmol/L Final        CO2   Date Value Range Status   01/13/2013 29  22-31 mmol/L Final       ALT   Date Value Range Status   01/13/2013 39  9-52 U/L Final        AST   Date Value Range Status   01/13/2013 31  14-48 U/L Final        ALKALINE PHOSPHATASE   Date Value Range Status   01/13/2013 99  38-126 U/L Final      (NOTE)      Normal Range for Adults only 92 years old      and above.  The Normal Range has not been      validated for children less than 24 years      old.        BILIRUBIN, TOTAL    Value Range Status   01/13/2013 0.4  0.2-1.3 MG/DL Final       [id number]    Value Range Status   01/13/2013 6.9* 3.4 to 5.9 % Final      (NOTE)      All Ages:       A1c Value   Interpretation       < 5.7%    Non-diabetic       5.7-6.4%   Increased risk for DM       > or =  6.5%   Consistent with DM      Revised:  07/19/11      Analysis Performed by [facility] Reference laboratory: [id number] [place] [address]         ASSESSMENT/PLAN:    AFTERCARE FOR BARIATRIC SURGERY  (primary encounter diagnosis)  Note: doing well  SEVERE OBESITY (BMI >= 40)  Note: improving, weight loss appropriate  GERD (GASTROESOPHAGEAL REFLUX DISEASE)  Note: no symptoms  HYPERTENSION  Note: well controlled  DM 2  Note: off insulin, on metformin alone  HX OF SLEEVE GASTRECTOMY  Note: doing well    FOLLOW-UP: 3 months with labs

## 2013-01-17 NOTE — Progress Notes (Addendum)
SUBJECTIVE:  62 year old female for follow up of diabetes. Diabetic Review of Systems - medication compliance:  compliant most of the time, home glucose monitoring: is performed regularly.  Other symptoms and concerns: NONE.    Outpatient Prescriptions Marked as Taking for the 01/16/13 encounter (Office Visit) with Merline Perkin, Cathlean Sauer (M.D.):  ATENOLOL  50 MG ORAL TAB TAKE 1 TABLET BY MOUTH TWICE DAILY FOR BLOOD PRESSURE Disp: 180 Rfl: 2   METFORMIN  500 MG ORAL TAB TAKE 1 TABLET BY MOUTH TWICE DAILY WITH FOOD FOR DIABETES Disp: 180 Rfl: 3   SIMVASTATIN 40 MG ORAL TAB TAKE 1 TABLET BY MOUTH EVERY NIGHT AT BEDTIME Disp: 60 Rfl: 5   SERTRALINE 100 MG ORAL TAB TAKE 1 TABLET BY MOUTH ONE TIME DAILY Disp: 60 Rfl: 1   PANTOPRAZOLE 40 MG ORAL TBEC DR TAB TAKE 1 TABLET BY MOUTH ONE TIME DAILY FOR 8 WEEKS Disp: 56 Rfl: 2   VANIQA 13.9 % TOP CREA APPLY THIN FILM TWICE DAILY TO AFFECTED AREAS Disp: 30 Rfl: 6   ONE TOUCH ULTRA TEST MISC STRIPS USE AS DIRECTED FOR DIABETES Disp: 100 Rfl: 5   LOSARTAN  50 MG ORAL TAB TAKE 1 TABLET BY MOUTH ONE TIME DAILY Disp: 90 Rfl: 3   TRAZODONE  50 MG ORAL TAB TAKE 1/2 TO 2 TABLETS BY MOUTH EVERY NIGHT AT BEDTIME AS NEEDED FOR SLEEP Disp: 120 Rfl: 3   MECLIZINE 25 MG ORAL TAB 1/2 to 1 tablet three times daily as needed for vertigo Disp: 90 Rfl: 0   ASPIRIN   81 MG ORAL TBEC DR TAB 1 TAB PO DAILY Disp: 100 Rfl: 0   VITAMIN D-3 2000 UNIT ORAL TAB Take 1-1-1/2 tabs po qd Disp: 45 Rfl: 3     No Facility-Administered Medications for the 01/16/13 encounter (Office Visit) with Janese Radabaugh, Cathlean Sauer (M.D.).    OBJECTIVE:  Appearance: alert, well appearing, and in no distress.  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  Patient decline allopurinol for now despite the elevated uric aicd level         FOLLOW-UP: 6 month

## 2013-01-22 NOTE — Progress Notes (Addendum)
The patient, Eileen Barnes's, identity was verified by name:    Bariatric Surgery Nutrition Follow-Up    Informant: pt    Post operative month: 3, post sleeve gastrectomy  Current Outpatient Prescriptions   Medication Sig Dispense Refill   ??? ATENOLOL  50 MG ORAL TAB TAKE 1 TABLET BY MOUTH TWICE DAILY FOR BLOOD PRESSURE  180  2   ??? METFORMIN  500 MG ORAL TAB TAKE 1 TABLET BY MOUTH TWICE DAILY WITH FOOD FOR DIABETES  180  3   ??? SIMVASTATIN 40 MG ORAL TAB TAKE 1 TABLET BY MOUTH EVERY NIGHT AT BEDTIME  60  5   ??? SERTRALINE 100 MG ORAL TAB TAKE 1 TABLET BY MOUTH ONE TIME DAILY  60  1   ??? PANTOPRAZOLE 40 MGORAL TBEC DR TAB TAKE 1 TABLET BY MOUTH ONE TIME DAILY FOR 8 WEEKS  56  2   ??? VANIQA 13.9 % TOP CREA APPLY THIN FILM TWICE DAILY TO AFFECTED AREAS  30 6   ??? ONE TOUCH ULTRA TEST MISC STRIPS USE AS DIRECTED FOR DIABETES  100  5   ??? ALBUTEROL SULFATE 2.5 MG /3 ML (0.083 %) INHL NEB SOLN USE 1 VIAL VIA NEBULIZER AS DIRECTED  1  0   ??? LOSARTAN  50 MG ORAL TAB TAKE 1 TABLET BY MOUTH ONE TIME DAILY  90  3   ??? TRAZODONE  50 MG ORAL TAB TAKE 1/2 TO 2 TABLETS BY MOUTH EVERY NIGHT AT BEDTIME AS NEEDED FOR SLEEP  120  3   ??? FUROSEMIDE 20 MG ORAL TAB TAKE 1 TO 2 TABLETS BY MOUTH ONCE A  DAY PRN FOR FLUID RETENTION  100  2   ??? BD INSULIN SYRINGE ULT-FINE II 1/2 ML 31 X 5/16" MISC SYRINGE USE AS DIRECTED FOR DIABETES  100  5   ??? MECLIZINE 25 MG ORAL TAB 1/2 to 1 tablet three times daily as needed for vertigo  90  0   ??? ASPIRIN   81 MG ORAL TBEC DR TAB 1 TAB PO DAILY  100  0   ??? VITAMIN D-3 2000 UNIT ORAL TAB Take 1-1-1/2 tabs po qd  45  3     No current facility-administered medications for this visit.     Subjective: Diet: protein w/every meal-1,100- 1,200 cal/day    Exercise:  40 min/day-20 min recumbent, 20 min walking    Protein:  60-70 g    Fluids: "not enough"  Vitamins:   Multivitamin: Flintstones complete 1-2x/day   Calcium: Y- w/D     What kind?citrate   Vitamin B12:500 daily              Vitamin D 2,000 IU (4,000  total)   Thiamin Y              B-Complex n   Iron: n   Omeprazole: Y    Vomiting: n  Constipation: n  Diarrhea: n  Dumping Syndrome: n    Eating behaviors:   How many meals a day: 3   How much food at a meal: 1/2 - 1 cup   How long does it take to eat meals: 20 min   How long do you wait before drinking after meals: N, 30 min   Snack foods: lite cheese, crackers, Greek yogurt    Objective:    Wt Readings from Last 3 Encounters:   01/16/13 230 lb (104.327 kg)   01/16/13 228 lb 6.4 oz (103.602 kg)  01/16/13 229 lb (103.874 kg)        Weight loss post operatively: 36 lbs    (EBW = 107 lbs  Percentage of EBW lost = EBW/wt lost after surgery x 100. = 33.6 percent    Labs:    Component 01/13/2013   WBC 9.5   RBC AUTO 4.68   HGB 13.8   HCT 41.8   MCV 89.4   MCH 29.4   MCHC 32.9   PLT'S AUTO 173   RDW, RBC 14.6   LYMPHS % 20.2 (L)   MONOS % 3.9 (L)   NEUTROPHILS % 64.8   EOS % 10.9 (H)   BASO'S % 0.2   LYMPHS 1.9   MONOS 0.4    6.2 (H)   EOS 1.0 (H)   BASO 0.0   GLUC 198 (H)   BUN 16   CREAT 0.63   NA 149   K 4.0   CL 106   CO2 29   CA 9.6   TP 6.9   ALB 4.2   ALKP 99   ALT 39   AST 31   TBILI 0.4   ANION GAP4 SERPL 18.0   GFR NONAFR AMER >60   GFR AFR AM >60 . . .   FE 58   TIBC 370   FE SAT 16   PREALBUMIN, SER QN 22   VIT B12 >1000 (H)   FOLATE >20.0 . . .   FERRITIN 44.0   VIT D, 25-OH 75   HGBA1C 6.9 (H)   URIC 7.3 (H)     VIT B12   Date Value Range Status   01/13/2013 >1000* 239-931 PG/ML Final       ALBUMIN   Date Value Range Status   01/13/2013 4.2  3.5-5.0 GM/DL Final       HGB   Date Value Range Status   01/13/2013 13.8  12.0-16.0 GM/DL Final       FOLATE   Date Value Range Status   01/13/2013 >20.0 (NOTE) NORMAL ADULTS(AGED 18-65)   2.79->20.00  NG/ML  FOLATE DEFICIENT PATIENTS   1.04-2.78 NG/ML   Final       FERRITIN   Date Value Range Status   01/13/2013 44.0  17.9-464 NG/ML Final         Assessment: Pt doing well with diet and wt loss. Meeting protein goals. Discussed meeting fluids goals of 48-64 oz. Pt reportts  feeling well. Keeps daily food log. Taking all rec vits/mins.    Plan: Pt will work on meeting fluid goals. Labs in 3 mos.    F/U: 3 mos, sched    Offie Waide L. Dayton Martes MS., RD., LD

## 2013-02-12 MED ORDER — CLOBETA CREAM EX
CUTANEOUS | Status: AC
Start: 2013-02-12 — End: 2014-02-12

## 2013-02-12 NOTE — Progress Notes (Addendum)
62 year old female last seen in Dermatology Dept 08/2011    Here for multiple skin complaints - see A/P  Hx BCC, R shoulder ED&C (?) and L distal leg  Saw Dr. Roger Shelter in Jan, spots on back "burned off" - and Rx for Efudex to use on leg - spot healed with Hytone and pt has yet to open the tube of Efudex  Previous laser treatment per Dr. Dorette Grate, and Linward Foster use    Surgery in Dec - "gastric sleeve" with significant wt loss - has retained suture of abdomen   Has dramatic hair shedding past month    Patient very pleased with lentigo treatment, bilateral dorsal hands - "made me feel a lot younger"    ROS: pt feels well; SKIN: rough spot on L forearm and R dorsal hand  Soc: on disability for hearing loss, reads lips     OBJECTIVE: Pleasant well-appearing female. Fair-skin type.   BP 137/57   Pulse 58   Temp(Src) 97.4 ??F (36.3 ??C) (Oral)   Resp 18   Wt 224 lb (101.606 kg)   BMI 35.08 kg/m2  General skin exam of scalp, face, ears, neck, chest, abdomen, back, bilateral upper and lower extremities, buttocks, hands and feet shows:  - significant findings noted below       ASSESSMENT/PLAN:    NEOPLASM OF UNCERTAIN BEHAVIOR, SKIN  (primary encounter diagnosis)  Note: mid-chest, 4 mo h/o non-healing sore  Plan: BIOPSY, SKIN, ONE LESION, SURGICAL PATHOLOGY          Shave Biopsy Procedure Note:  Mid-chest   - verbal and written consent obtained and patient desires to proceed   - no allergy to local anesthesia, no history of epinephrine reaction   - local anesthesia administered with 1% lidocaine with epi   - shave biopsy followed by drysol 20% application and electrodessication   - specimen to surg path   - petrolatum and bandaid applied, wound care instructions given     ACTINIC KERATOSIS  Note: 2 lesions - L extensor forearm and R dorsal hand  Plan: DESTRUCTION ACTINIC KERATOSIS , FIRST LESION,         DESTRUCTION ACTINIC KERATOSIS , 2 THROUGH 14         LESIONS, EACH          Liquid nitrogen destruction x 10 seconds to 2 lesions, cryo  wound care reviewed; procedure well-tolerated     TELOGEN EFFLUVIUM  Note: Discussed length of time to see resolution - from surgery and wt loss    ECZEMA  Note: itches back with "steak knife" at least 3x per day, no primary rash - also with xerotic eczema of legs  Plan: CLOBETASOL 0.05% SOLN IN CERAVE TOP CREAM - and discussed alternating with plain CeraVe as improves          HX OF BASAL CELL CA OF SKIN - sites clear today  Note: L leg, R upper posterior shoulder (Drs. Dennie Maizes)      FOLLOW-UP: TFU 1 week, may need excision on chest; then 1 year or sooner as needed for changing lesions

## 2013-02-12 NOTE — Patient Instructions (Addendum)
Do you understand your plan of care today? Is there anything else I can help you with today? You may be getting a survey in the mail regarding your care today. If you do please take a few minutes to fill it out and mail it back to [place]. It would be greatly appreciated.      Immediately after your treatment with Liquid Nitrogen, the area will burn for about 20 minutes.  They will redden and a blister may form within 24 hours.  Do not open the blister.  If it breaks, ignore it, except to pat it dry.  Later the area becomes crusted and dry.  No dressing are necessary.  Wash in your usual fashion.  The crusts will fall off in 2 to 5 weeks. Occasionally an area needs to be treated over again if the lesion is not completely gone. Apply bacitracin  Once or twice per day.        Wound Care Instructions      1. Leave the bandaid undisturbed and dry for the first 24 hours.    2. Clean the area twice daily with mild soap and water.     3. After cleansing, apply clean vaseline ointment and a bandaid to the wound. We do not recommend topical antibiotics as they can occasionally cause allergic reactions.    4. Continue this wound care twice daily until healed (when the wound is no longer open). If youdo not use a bandaid, apply the vaseline with a Q-Tip 4 times daily to keep it moist and the wound will heal faster.     5. If the skin around the wound becomes red, swollen, painful or has a green discharge, you may have an infected wound. In this case, notify our office immediately.     6. A slight amount of pink or yellow-Clemence discharge from your wound is normal.    7. For open wounds, expect a red rim around the wound as it heals.     8. Wounds may bleed. If bleeding occurs, apply direct pressure to the wound with a clean, dry cloth for 15 minutes by the clock.    If bleeding does not stop then call the [facility] at [phone]          Dr. Lavonia Dana will call you in 1 week     Follow up in 1 year with dermatology

## 2013-02-19 ENCOUNTER — Telehealth

## 2013-02-19 NOTE — Progress Notes (Addendum)
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    LATERAL AND DEEP MARGINS ARE FREE OF LESION.    She states "healing fine" -we discussed the possibility that the lesion has been adequately treated in the biopsy and ED.    Discussed pros and cons of excision at this point, pt comfortable monitoring area and will call if any recurrent growths or abnormal healing.      Derm nursing, please change the 1 year recall to 6 months

## 2013-02-20 NOTE — Progress Notes (Addendum)
1 year recall changed to 6 month recall per Dr. Lavonia Dana. Thank you.

## 2013-03-06 NOTE — Patient Instructions (Addendum)
Please call the eye clinic at [phone] if new symptoms appear or eye concerns present.      At your next visit please plan to be here for 1 to 2 hours. Your visit may include a dilated eye exam and additional testing.    Please bring your glasses with you to future eye appointments.    Please bring eye medications you are taking, including any over the counter products.    You may be receiving a survey in the mail regarding your satisfaction with this ophthalmology visit. We in the Ophthalmology Department would like to request that you complete this questionnaire because your feedback is valuable and will allow us to continue providing the highest quality care.

## 2013-03-08 NOTE — Progress Notes (Addendum)
The patient, Eileen Barnes, identity was verified by name and MRN.  03/06/2013  62 year old  Chief Complaint   Patient presents with   ??? DIABETIC RETINOPATHY SCREENING     pt c/o poor vision with bifocal contacts lenses.     HGBA1C   Date Value Range Status   01/13/2013 6.9* 3.4 to 5.9 % Final      (NOTE)      All Ages:       A1c Value   Interpretation       < 5.7%    Non-diabetic       5.7-6.4%   Increased risk for DM       > or = 6.5%   Consistent with DM      Revised:  07/19/11    Analysis Performed by [facility] laboratory: [id number] [place]., [address]     Medication given and are available in [facility].  Reviewed technician notes and accepted.  Allergies and Medications reviewed.  POH Pt denies previous eye surgery, injury or laser treatment.  Chalazion od  LASIK 2000 OU   Cataracts ou  FOH Pt denies any known familial ocular disease.  DM X 12 years  NEM    Fluress 1 gtt OU 1:57 PM    Mydriacyl 1% 2 gtts OU              PUPILS:   PERRLA  No APD         SLE:    Lids/lashes: OU no lesions, + debris in tear film                 Conj/sclera: OU no injection                Cornea: OU clear, no stains             A/C:    OU deep and quiet              Iris:    OU no RI             Lens:  OD nuclear trace to 1-2+          OS nuclear trace to 1-2+    Fundus photographs taken OU    The patient's  examination today included the use of dilating drops.The patient was informed of the potential, time-limited detrimental effects of these medications on the vision and the possible inability of the patient to safely drive.     IMP:   Diagnosis   1. DM 2 (250.00)    2. 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           Discussed need for BS control less than 140 and need for annual eye exam.   Patient aware of slowly progressing cataracts OU, pt happy with vision.   Diabetes controlled, pt met target <7.0 % HgbA1c.      AVS  distributed and discussed. Patient verbalizes understanding and has no additional questions.    Va, ta, fluress 1 gtt ou and dilate Mydriacyl 1% 2 gtts OU next visit

## 2013-03-08 NOTE — Progress Notes (Addendum)
Fundus photos reviewed.  No diabetic retinopathy noted.  Recommend repeat photos in 18 months.

## 2013-03-14 ENCOUNTER — Encounter

## 2013-03-14 NOTE — Telephone Encounter (Addendum)
RX Sertraline 100 mg QD # 60 Mail Order Pharmacy.  Will schedule July f/u when schedules open in June.

## 2013-03-17 ENCOUNTER — Encounter

## 2013-03-17 NOTE — Telephone Encounter (Addendum)
Pt wants the Sertraline sent to the [facility] mail order pharmacy.

## 2013-03-17 NOTE — Telephone Encounter (Addendum)
Last seen by Lenda Kelp CNS on 11/19/12. Order pended to [employer].

## 2013-03-18 ENCOUNTER — Encounter

## 2013-03-18 NOTE — Telephone Encounter (Addendum)
Scheduled TV for 5/8    Ending encounter

## 2013-03-18 NOTE — Telephone Encounter (Addendum)
spoke to mbr, appt made and accepted

## 2013-03-18 NOTE — Telephone Encounter (Addendum)
Eileen Barnes a 62 year old with chief complaint of generalized hair loss.  Member states she had bariatric surgery Dec. 2013 and her hair started for fall out in Feb. 2014.  States hair is not falling out in round patches and no pustules or scabs on scalp, hair just falls out when she touches  Or brushes it.  States she has to clean her brush everytime she brushes her hair.  Member recently had appt. With Dr. Lavonia Dana for for different sx and was advised this is nl after surgery though this is going on for 4 mos. Now.  advised per attached.  Member req. Msg. To pcp.    Please advise.   .    Triaged and Assessed: .Alopecia, Hair Loss-P Protocol    N Broken hair in scalp with inflamed patches, pustules or scabs.    N ALOPECIA AREATA -- small patches of complete hair loss.  Hair comes out in circular pattern.  No scaling, sores or pustules in scalp.    Y TELOGEN EFFLUVIUM -- temporary increase in loss of hair:  had baby in last 6 months, major surgery in last month, high fever or severe flu.    Hair will grow normally after few months.  If problem continues after 6 months, non-urgent appointment.      Reassure patient 100 - 150 hairs per day is normal loss.  No proven treatment.      Triage Plan:  Demographics verified, Instructed to call back if symptoms persist/change/worsen, Verbalized understanding/willingness to follow instructions and Messages are responded to in 24-48 business hours.

## 2013-03-18 NOTE — Telephone Encounter (Addendum)
Left message on vm to return call, please schedule telephone visit upon call back.

## 2013-03-18 NOTE — Telephone Encounter (Addendum)
Follow Up    Chief Complaint: per recall notice / needs appt in June     With Provider: dr Benjaman Kindler     Preferred Days:  Monday, Tuesday, Wednesday, Thursday and Friday    Preferred Time:  AM    Preferred Location: [facility]    Preferred Appointment: Based on template review appointment is being forwarded to the department for scheduling assistance    Contact Phone Number:   Telephone Information:   Home Phone [phone]             Patient advised of office/PCP has 24 - 48 business hours to return their call.

## 2013-03-18 NOTE — Telephone Encounter (Addendum)
Schedule for phone appointment to discuss patient concerns

## 2013-03-20 NOTE — Progress Notes (Addendum)
The patient, Eileen Barnes's, identity was verified by name    Member states she had bariatric surgery Dec. 2013 and her hair started for fall out in Feb. 2014. States hair is not falling out in round patches and no pustules or scabs on scalp, hair just falls out when she touches Or brushes it. States she has to clean her brush everytime she brushes her hair.   All recent labs reviewed and normal  Advise to continue current supplements and sulfate  Free hair products

## 2013-04-16 ENCOUNTER — Encounter

## 2013-04-16 MED ORDER — LOSARTAN POTASSIUM 50 MG PO TABS
50 MG | ORAL | Status: DC
Start: 2013-04-16 — End: 2014-02-02

## 2013-04-17 NOTE — Patient Instructions (Addendum)
Follow Up in 6 months.  If you are not contacted please call [phone]    Do you understand your plan of care today? Is there anything else I can help you with today? You may be getting a survey in the mail regarding your care today. If you do please take a few minutes to fill it out and mail it back to [place]. It would be greatly appreciated.

## 2013-04-17 NOTE — Progress Notes (Addendum)
The patient, Eileen Barnes's, identity was verified by name.    Bariatric Surgery Nutrition Follow-Up    Informant: pt    Post operative month: 6 mos, post sleeve  Current Outpatient Prescriptions   Medication Sig Dispense Refill   ??? LOSARTAN  50 MG ORAL TAB TAKE 1 TABLET BY MOUTH ONE TIME DAILY  90  3   ??? SERTRALINE 100 MG ORAL TAB TAKE 1 TABLET BY MOUTH ONE TIME DAILY  60  2   ??? CLOBETASOL 0.05% SOLN IN CERAVE TOP CREA APPLY TO AFFECTED AREA(S) OF SKIN 2 TO 3 TIMES A DAY AS NEEDED FOR ITCHING OR RASH (PLEASE REORDER 2 BUSINESS DAYS IN ADVANCE)  454  1   ??? ATENOLOL  50 MG ORAL TAB TAKE 1 TABLET BY MOUTH TWICE DAILY FOR BLOOD PRESSURE  180  2   ??? METFORMIN  500 MG ORAL TAB TAKE 1 TABLET BY MOUTH TWICE DAILY WITH FOOD FOR DIABETES  180  3   ??? SIMVASTATIN 40 MG ORAL TAB TAKE 1 TABLET BY MOUTH EVERY NIGHT AT BEDTIME  60  5   ??? PANTOPRAZOLE 40 MG ORAL TBEC DR TAB TAKE 1 TABLET BY MOUTH ONE TIME DAILY FOR 8WEEKS  56  2   ??? VANIQA 13.9 % TOP CREA APPLY THIN FILM TWICE DAILY TO AFFECTED AREAS  30  6   ??? ONE TOUCH ULTRA TEST MISC STRIPS USE AS DIRECTED FOR DIABETES  100  5   ??? ALBUTEROL SULFATE 2.5 MG /3 ML (0.083 %) INHL NEB SOLN USE 1 VIAL VIA NEBULIZER AS DIRECTED  1  0   ??? TRAZODONE  50 MG ORAL TAB TAKE 1/2 TO 2 TABLETS BY MOUTH EVERY NIGHT AT BEDTIME AS NEEDED FOR SLEEP  120  3   ??? FUROSEMIDE 20 MG ORAL TAB TAKE 1 TO 2 TABLETS BY MOUTH ONCE A  DAY PRN FOR FLUID RETENTION  100  2   ??? BD INSULIN SYRINGE ULT-FINE II 1/2 ML 31 X 5/16" MISC SYRINGE USE AS DIRECTED FOR DIABETES  100  5   ??? MECLIZINE 25 MG ORAL TAB 1/2 to 1 tablet three times daily as needed for vertigo  90  0   ??? ASPIRIN   81 MG ORAL TBEC DR TAB 1 TAB PO DAILY  100  0     No current facility-administered medications for this visit.     Subjective: Feels good.  Exercise:  Biking, boating, says a lot more active than before surgery  Protein:  60 g - 70 g    Fluids: 24 oz water +  4 oz V8 on some days + 8 oz coffee,+ 4 oz almond milk = 36 - 40  oz  Vitamins:   Multivitamin: Centrum silver for women (w/iron? - pt to check)   Calcium: 630 mg 2x/day   What kind? citrate   Biotin 800   Vitamin B12:500 /day              Vitamin D 2,000 IU   Thiamin 100, will D/C              B-Complex N   Iron: N   Pantoprazole: just finished    Vomiting: n  Constipation: Y  Diarrhea: n  Dumping Syndrome: n      Objective:    Wt Readings from Last 3 Encounters:   04/17/13 214 lb 12.8 oz (97.433 kg)   02/12/13 224 lb (101.606 kg)   01/16/13 230 lb (104.327  kg)     Weight loss post operatively: 52 lbs  Wt before surgery:266 lbs  EBW = 107 lbs  Percentage of EBW lost = 48 percent    Labs:  VIT B12   Date Value Range Status   04/09/2013 >1000 Analysis Performed by [name] [address] [place], [place] [address]  [phone] PG/ML Final       ALBUMIN   Date Value Range Status   04/09/2013 4.3  3.5-5.0 GM/DL Final       HGB   Date Value Range Status   04/09/2013 13.9  12.0-16.0 GM/DL Final       FOLATE   Date Value Range Status   04/09/2013 >20.0 (NOTE) NORMAL ADULTS(AGED 18-65)   2.79->20.00  NG/ML  FOLATE DEFICIENT PATIENTS   1.04-2.78 NG/ML Analysis Performed by [name] [place], [place] [address] [phone]   Final       FERRITIN   Date Value Range Status   04/09/2013 51.6  17.9-464 NG/ML Final      Analysis Performed by [name] [phone] [place], [place] [address]       [phone]     Component 04/09/2013   WBC 9.8   RBC AUTO 4.72   HGB 13.9   HCT 42.3   MCV 89.6   MCH 29.4   MCHC 32.8   PLT'S AUTO 184   RDW,RBC 13.5   LYMPHS % 20.5 (L)   MONOS % 4.1 (L)   NEUTROPHILS % 72.0 (H)   EOS % 2.9   BASO'S % 0.5   LYMPHS 2.0   MONOS 0.4   GRANULOCYTES 7.0 (H)   EOS 0.3   BASO 0.0   GLUC 134 (H)   BUN 15   CREAT 0.51 (L)   NA 145   K 4.5   CL 103   CO2 32 (H)   CA 9.6   TP 7.1   ALB 4.3   ALKP 99   ALT 22   AST 23   TBILI 0.4   ANION GAP4 SERPL 14.5   GFR NONAFR AMER >60   GFR AFR AM >60 . . .   FE 58   TIBC 376   FE SAT 15   CHOL 131    LDL DIRECT 66   HDL 36 (L)   PREALBUMIN, SER QN 26   VIT B12 >1000 (H) . . .   FOLATE >20.0 . . .   FERRITIN 51.6   VIT D, 25-OH 45   HGBA1C 6.8 (H)       Assessment: Pt is doing well overall-meets protein goals, taking vits/mins.  C/O constipation, hard stools, has Hx - r/t inadequate fluid and fiber intake. Discussed fluids and fiber with pt.    Plan: PI:    Work on making your fluids goals    Work on including more vegetables and a little fruit in your diet.    Choose whole grains over the refined grains.    (Will consider switching to cal/mag citrate pendingOK from physician)    Patient education: fluids and fiber    [name] MS., RD., LD

## 2013-04-17 NOTE — Progress Notes (Addendum)
The patient, Eileen Barnes, identity was verified by name and MRN.    Eileen Barnes is a status post bariatric surgery and is here for 6 month followup visit.  Weight loss is excellent.  Protein intake is excellent.  Fluid intake is fair.  Patient feels well, except for constipation.      Past Medical History   Diagnosis Date   ??? SEVERE OBESITY (BMI >= 40) 07/09/2004     MORBID OBESITY   ??? ACTINIC KERATOSIS. 02/12/2003     ACTINIC KERATOSIS   ??? DM 2, UNCONTROLLED, W DIABETIC POLYNEUROPATHY.    ??? ESSENTIAL HTN    ??? HYPERLIPIDEMIA    ??? GOUT    ??? 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 PANIC DISORDER W AGORAPHOBIA 07/22/2010   ??? GERD (GASTROESOPHAGEAL REFLUX DISEASE)    ??? INTERNAL HEMORRHOID 08/07/2012   ??? HX OF COLONIC POLYP 08/07/2012       Past Surgical History   Procedure Laterality Date   ??? Lasik     ??? Past surgical history, other       appendectomy   ??? Past surgical history, other       Tonsillectomy   ??? Past surgical history, other       D&C    ??? 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 Codeine And Opiate Derivatives Nausea and/or Vomiting   ??? Kiwi Fruit      Pt. States throat closes up    ??? Lisinopril      Cough     ??? Lovastatin    ??? Pamelor (Nortriptyline)      Severe constipation   ??? Talwin (Pentazocine Lactate) Nausea and/or Vomiting   ??? Tramadol Hcl Nausea and/or Vomiting   ??? Vicodin (Acetaminophen-Hydrocodone)      Vomiting         ROS    Hair Loss yes  Leg Cramps no  Skin Rash no  Abdominal pain no  Constipation yesmouth no  Diarrhea no  Binge eating no  Memory loss no  Sweating no  Tremor no  Drowsiness no  Fatigue no  Insomnia no  Nausea/vomiting no  Depression no  Chest pain no  Shortness of breath no      PE:    BP 118/74   Pulse 64   Temp(Src) 98 ??F (36.7 ??C) (Oral)   Resp 15   Ht 5' 7.4" (1.712 m)   Wt 214 lb 12.8 oz (97.433 kg)   BMI 33.24 kg/m2    GENERAL APPEARANCE: alert, oriented, no acute distress and obese  SKIN: well hydrated, no lesions,  surgical site examined  ABDOMEN: soft, nontender, nondistended, no masses or organomegaly    VIT B12   Date Value Range Status   04/09/2013 >1000 Analysis Performed by [facility] [address] [place], [place] [address]  [phone] PG/ML Final       FOLATE   Date Value Range Status   04/09/2013 >20.0 (NOTE) NORMAL ADULTS(AGED 18-65)   2.79->20.00  NG/ML  FOLATE DEFICIENT PATIENTS   1.04-2.78 NG/ML Analysis Performed by [facility] [phone] [place], [place] [address] [phone]   Final       IRON   Date Value Range Status   04/09/2013 58  37-170 UG/DL Final        TOTAL IRON BINDING CAPACITY   Date Value Range Status   04/09/2013 376  161-096 UG/DL Final        IRON SAT   Date Value Range Status   04/09/2013 15  14-34 % Final      Analysis Performed by [facility] [address] [place], [place] [address]       [phone]        FERRITIN   Date Value Range Status   04/09/2013 51.6  17.9-464 NG/ML Final      Analysis Performed by [facility] [phone] [place], [place] [address]       [phone]         WBC COUNT   Date Value Range Status   04/09/2013 9.8  4.5-11.0 K/uL Final        HGB   Date Value Range Status   04/09/2013 13.9  12.0-16.0 GM/DL Final        HEMATOCRIT   Date Value Range Status   04/09/2013 42.3  36.0-48.0 % Final        PLATELETS, AUTOMATED COUNT   Date Value Range Status   04/09/2013 184  150-450 K/uL Final         POTASSIUM   Date Value Range Status   04/09/2013 4.5  3.5-5.0 mmol/L Final        SODIUM   Date Value Range Status   04/09/2013 145  135-150 mmol/L Final        CHLORIDE   Date Value Range Status   04/09/2013 103  99-111 mmol/L Final        CO2   Date Value Range Status   04/09/2013 32* 22-31 mmol/L Final       ALT   Date Value Range Status   04/09/2013 22  9-52 U/L Final        AST   Date Value Range Status   04/09/2013 23  14-48 U/L Final        ALKALINE PHOSPHATASE   Date Value Range Status   04/09/2013 99  38-126 U/L Final      (NOTE)      Normal Range for  Adults only 62 years old      and above.  The Normal Range has not been      validated for children less than 35 years      old.        BILIRUBIN, TOTAL   Date Value Range Status   04/09/2013 0.4  0.2-1.3 MG/DL Final       [id number]   Date Value Range Status   04/09/2013 6.8* 4.8-5.9 % Final      Analysis Performed by [facility]: [address] [place],       [place] [address]         ASSESSMENT/PLAN:    SEVERE OBESITY (BMI >= 40)  (primary encounter diagnosis)  Note: improving, appropriate weight loss  HYPERTENSION  Note: on medication  DM 2  Note: on Metformin, improving  GERD (GASTROESOPHAGEAL REFLUX DISEASE)  Note: no symptoms  Plan: LIPID PANEL, NON-FASTING (DIRECT LDL, HDL,         CHOL), HEMOGLOBIN A1C            HX OF SLEEVE GASTRECTOMY  Note: stable  Plan: PREALBUMIN, COMPREHENSIVE METABOLIC PANEL         (NA,K,CL,CO2,BUN,CR,GLU,TOTAL CA,         ALB,TBILI,TPROT,ALT,AST,ALKP), VITAMIN B12, CBC        W DIFFERENTIAL, AUTO, FOLIC ACID, PLASMA, IRON  AND TIBC, FERRITIN, VITAMIN D, 25-HYDROXY              FOLLOW-UP: 6 months

## 2013-04-17 NOTE — Patient Instructions (Addendum)
Work on making your fluids goals    Work on including more vegetables and a little fruit in your diet.    Choose whole grains over the refined grains.

## 2013-04-24 NOTE — Progress Notes (Addendum)
The patient, Eileen Barnes's, identity was verified by name.    Member attended Bariatric Support Group.  Panel included:Dietitian  Discussion included emphasis on lifelong and permanent changes such as correct eating behaviors, protein recommendations, vitamin supplementation and possible pitfalls of dining out .  Social aspects of Bariatric Surgery - self image, peer pressure and taking care of one's needs - were examined by members.    Nunzio Cory, MS, RD, LD

## 2013-05-19 ENCOUNTER — Encounter

## 2013-05-19 NOTE — Progress Notes (Addendum)
Letter sent per Darci Current [facility] advising no more refills until seen by provider in [facility].

## 2013-05-26 NOTE — Progress Notes (Addendum)
The patient, Eileen Barnes's, identity was verified by name.    Member attended Bariatric Support Group.  Panel included:Dietitian  Discussion included emphasis on lifelong and permanent changes such as correct eating behaviors, protein recommendations, vitamin supplementation and possible pitfalls of dining out .  Social aspects of Bariatric Surgery - self image, peer pressure and taking care of one's needs - were examined by members.    Nunzio Cory, MS, RD, LD

## 2013-06-03 NOTE — Patient Instructions (Addendum)
We want to know that ALL of your needs were met today and that we hopefully have exceeded your expectations.  You  may receive a survey about your visit today in the mail. Please let us know if we're doing a good job and how we can improve our service to you.    We encourage you to contact us at  [phone] if any of your needs were not met at this appointment.

## 2013-06-03 NOTE — Progress Notes (Addendum)
Podiatric Office Visit:  The patient, Eileen Barnes, identity was verified by name and MRN.  Chief Complaint   Patient presents with   ??? FOLLOW UP CARE     yearly dm foot eval     SUBJECTIVE:  Chief Complaint: Eileen Barnes  is a 62 year old DM female with peripheral neuropathy presents for diabetic foot check. Patient reports that she has no pain in the feet. However she does report numbness and states that she feels unstable when she is not wearing her diabetic shoes, which she has had for over 1 and 1/2 years. She reports that she also has a black toenail on her left 3rd digit from kicking the post of her bed, however there is no pain.The patient has no other pedal complaints at this time. Patient reports that she is no longer taking glyburide, insulin, or any gout medication since, her gastric band surgery.    [name] (M.D.), MEDICAL DOCTOR:  Referring Physician:    PMH, Allergies and Meds reviewed in Epic chart      Review of Systems -  Patient denies fever, chills, nausea, vomiting, diarrhea, calf pain, shortness of breath and chest pain.    Physical Exam: Patient is alert and oriented times three.     Vascular:   palpable Dorsalis Pedis and Posterior Tibial Pulses B/L  Capillary Fill time < 3 seconds to digits 1-5 B/L  skin temperature warm to warm tibial tuberosity to the digits B/L  + edema dorsal right midfoot  NO cellulitis/lymphangitis present, no Homans or calf pain    Neurological:   diminished light touch/epicritic sensation B/L  absent vibratory and sharp/dull sensations  absent protective sensation toes to ankle b/l.    Dermatological:   Skin appears supple with mild xerosis at the ball of the left foot. good color, texture, turgor. No open lesions present. No callosities present. Webspaces clean and dry 1-4 b/l. Nails 1-5 b/l appear normal.      Musculoskeletal/Orthopaedic:   Structurally within normal limits  Toes 2-3 right are mildly divergent  No pain to palpation over the 3rd  metatarsal dorsal right  +5/5 muscle strength Dorsiflexion, Plantarflexion, Inversion, Eversion B/L  ROM of the 1st MTPJ is full b/l.  ROM of the MTJ/STJ is full without pain or crepitus b/l.  Ankle joint ROM is full  B/L  Rectus arch      ASSESSMENT  Diagnosis   1. 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 REFERRAL DME EQUIPMENT OTHER.         Tx//    Office Visit and exam  Patient education about prevention of subungual hematoma's and education about daily foot inspections.  Rx. Diabetic shoes-dme sent  Patient acknowledges understanding of above information.  F/u PRN     Gaspar Cola MS IV  The resident/student was under my direct supervision during today's patient encounter. I am in agreement with the medical chart documentation, as this is a reflection of my personal examination, assessment and treatment plan.    Sharren Bridge Rinnah Peppel, DPM

## 2013-06-18 ENCOUNTER — Ambulatory Visit

## 2013-06-18 MED ORDER — SERTRALINE HCL 100 MG PO TABS
100 MG | ORAL | Status: DC
Start: 2013-06-18 — End: 2013-08-13

## 2013-06-18 MED ORDER — TRAZODONE HCL 50 MG PO TABS
50 MG | ORAL | Status: DC
Start: 2013-06-18 — End: 2014-03-03

## 2013-06-18 NOTE — Progress Notes (Addendum)
FOLLOW UP    The patient, Eileen Barnes, identity was verified by name and MRN    Those attending session: patient    SYMPTOM SCREENS: Duration: since last visit continues to report mood good and coping well overall; denies anxiety or depression;  Denies vegetative symptoms; sleep good with Trazodone 50-100mg  po qhs prn; continues Zoloft 100mg  po qd; Denies side effects other than some sexual side effects "had that with Paxil too... Love the Zoloft... Afraid to come off"    Session content: Enjoying increased ability to be active since 70+ pound weight loss pose bariatric surgery 12-13; spending time out in yard; looks forward to son's wedding on 9-4 which is her 44th anniversary and then to [place] with family after; also looks forward to 2 month in [place] over winter; major stress is concern about husband's health including DM and CRF and his "slowing down"; shred his frustrations with difficulties with sexual performance as well as her feeling  Unsatisfied as well; asking about options.    MENTAL STATUS    Mental Status:appearance: appropriately dressed, appropriately groomed, good hygiene, light make-up, hair styled, affect: congruent with mood and appropriate, attitude: Cooperative, mood: calm, bright, easily verbal, cooperative, personable, speech: appropriate, thought content: no evidence of psychosis, thought process; logical, coherent, orientation: oriented in all spheres, memory: recent:  good and remote:  good, insight: fair, judgment: good, cognitive: intact and motor behavior: normal    RISK ASSESSMENT    Suicide screen: denies current suicidal ideation, plan and intent    Homicide screen: denies homicidal ideation, plan and intent    CLINICAL ASSESSMENT    Clinical assessment: Mood symptoms continue improved and coping well overall with treatment and self intervention.    DIAGNOSIS:    AXIS I:   Diagnosis   1. 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 Client verbalizes understanding and agrees to above plan.    GOALS, ASSIGNMENTS    COUNSELING TIME: 25 minutes    Interventions in session: Gave positive feedback for efforts and progress. Reviewed and reinforced self care strategies and resources including physiological factors that impact mood symptoms and stress discussed and reinforced. Explored options and resources to cope with stress. Educated about medications including options, risks, and benefits.    Goals: Continue to more effectively cope with mood symptoms and stress.    Assignments: As above. Continue Zoloft 100mg  po qd - may try to taper to 50mg  po qd, Continue Trazodone 50-100mg  po qd prn sleep    Pt verbalizes understanding of and agreement with tx recommendation and plan - yes    NEXT STEPS    Recommended next steps: call as needed; follow up 30 with me 6-9 months and prn; follow up as scheduled with PCP including for husband and will discuss sexual performance issues and options

## 2013-06-20 NOTE — Telephone Encounter (Addendum)
Follow Up    Chief Complaint: recall notice, template is full.    With Provider: assaf    Department: der m    Preferred Days:  Monday, Tuesday, Wednesday, Thursday and Friday    Preferred Time:  no preference    Preferred Location: [facility]    Preferred Appointment: Based on template review appointment is being forwarded to the department for scheduling assistance    Contact Phone Number:   Telephone Information:   Home Phone [phone]   Work Phone Not on file.   Mobile [phone]     Patient advised of office/PCP has 24 - 48 business hours to return their call.

## 2013-06-23 NOTE — Telephone Encounter (Addendum)
Pt due for annual skin check -02/2014. Does pt have new concern? Growth? I will call at a later hour to obtain more information from pt. Thank you.

## 2013-06-26 NOTE — Telephone Encounter (Addendum)
I spoke to pt's husband. He will have pt cb at [phone]. Please transfer pt to derm dept upon cb. Thank you.

## 2013-06-26 NOTE — Telephone Encounter (Addendum)
I spoke to pt. Appt scheduled per her request for new growth. 1 year recall was changed to 6 month recall per Dr. Lavonia Dana in April after bx came back. Pt due in OCT. Pt aware and will schedule this appt. Pt thankful for call and had no further questions for me.

## 2013-07-10 ENCOUNTER — Encounter

## 2013-07-17 NOTE — Telephone Encounter (Addendum)
I have attempted to contact this patient by phone with the following results: message left to return my call to receive message below.p. clucas lpn

## 2013-07-17 NOTE — Telephone Encounter (Addendum)
Yes labs ordered are complete

## 2013-07-17 NOTE — Telephone Encounter (Addendum)
mbr will be coming in on 07/30/13 to see pcp and would like all labs if needed called in pls notify at home [phone]  Thank you        The doctor has 24 to 48 hours to respond to your message

## 2013-07-17 NOTE — Telephone Encounter (Addendum)
Patient has lab work already ordered from Dr Benjaman Kindler. Is that sufficient?

## 2013-07-17 NOTE — Telephone Encounter (Addendum)
I have reviewed the provider's instructions with the patient, answering all questions to her satisfaction.      mbr sts okay thank you

## 2013-07-18 ENCOUNTER — Encounter

## 2013-07-19 LAB — COMPREHENSIVE METABOLIC PANEL
ALT: 13 U/L (ref 9–52)
AST: 19 U/L (ref 14–48)
Albumin: 4.3 GM/DL (ref 3.5–5.0)
Alkaline Phosphatase: 91 U/L (ref 38–126)
Anion Gap: 20.3 mmol/L — ABNORMAL HIGH (ref 7–18)
BUN: 24 MG/DL — ABNORMAL HIGH (ref 7–17)
CO2: 28 mmol/L (ref 22–31)
Calcium: 9.6 MG/DL (ref 8.4–10.2)
Chloride: 104 mmol/L (ref 99–111)
Creatinine: 0.81 MG/DL (ref 0.52–1.04)
GFR African American: 60 mL/min/m
GFR Non-African American: >60 mL/min/m (ref >60)
Glucose: 116 MG/DL — ABNORMAL HIGH (ref 80–115)
Potassium: 4.3 mmol/L (ref 3.5–5.0)
Sodium: 148 mmol/L (ref 135–150)
Total Bilirubin: 0.4 MG/DL (ref 0.2–1.3)
Total Protein: 7.1 GM/DL (ref 6.5–8.8)

## 2013-07-19 LAB — MICROALBUMIN / CREATININE URINE RATIO
Creatinine, Ur: 434.7 MG/DL
Microalbumin Creatinine Ratio: 26.5 MG/G (ref <30)
Microalbumin, Random Urine: 11.5 MG/DL — ABNORMAL HIGH (ref <3.0)

## 2013-07-22 LAB — DIABETES PANEL NON-FASTING
Cholesterol, Total: 153 MG/DL (ref <200)
HDL: 40 MG/DL — ABNORMAL LOW (ref >40)
Hemoglobin A1C: 6.4 % — ABNORMAL HIGH (ref 4.8–5.9)
LDL Direct: 80 MG/DL (ref 30–130)

## 2013-07-30 MED ORDER — LOSARTAN POTASSIUM 25 MG PO TABS
25 MG | ORAL | Status: DC
Start: 2013-07-30 — End: 2014-09-16

## 2013-07-30 MED ORDER — ZOSTAVAX 19400 UNT/0.65ML SC SOLR
19400 UNT/0.65ML | SUBCUTANEOUS | Status: AC
Start: 2013-07-30 — End: 2013-08-09

## 2013-07-30 MED ORDER — ATORVASTATIN CALCIUM 10 MG PO TABS
10 MG | ORAL | Status: DC
Start: 2013-07-30 — End: 2014-09-04

## 2013-07-30 NOTE — Patient Instructions (Addendum)
Your [facility] Instructions  A Healthy Lifestyle: After Your Visit  Your Care Instructions  A healthy lifestyle can help you feel good, stay at a healthy weight, and have plenty of energy for both work and play. A healthy lifestyle is something you can share with your whole family.  A healthy lifestyle also can lower your risk for serious health problems, such as high blood pressure, heart disease, and diabetes.  You can follow a few steps listed below to improve your health and the health of your family.  Follow-up care is a key part of your treatment and safety. Be sure to make and go to all appointments, and call your doctor if you are having problems. It's also a good idea to know your test results and keep a list of the medicines you take.  How can you care for yourself at home?   Do not eat too much sugar, fat, or fast foods. You can still have dessert and treats now and then. The goal is moderation.   Start small to improve your eating habits. Pay attention to portion sizes, drink less juice and soda pop, and eat more fruits and vegetables.   Eat a healthy amount of food. A 3-ounce serving of meat, for example, is about the size of a deck of cards. Fill the rest of your plate with vegetables and whole grains.   Limit the amount of soda and sports drinks you have every day. Drink more water when you are thirsty.   Eat at least 5 servings of fruits and vegetables every day. It may seem like a lot, but it is not hard to reach this goal. A serving or helping is 1 piece of fruit, 1 cup of vegetables, or 2 cups of leafy, raw vegetables. Have an apple or some carrot sticks as an afternoon snack instead of a candy bar. Try to have fruits and/or vegetables at every meal.   Make exercise part of your daily routine. You may want to start with simple activities, such as walking, bicycling, or slow swimming. Try to be active 30 to 60 minutes every day. You do not need to do all 30 to 60 minutes all at  once. For example, you can exercise 3 times a day for 10 or 20 minutes. Moderate exercise is safe for most people, but it is always a good idea to talk to your doctor before starting an exercise program.   Keep moving. Mow the lawn, work in the garden, or [employer]. Take the stairs instead of the elevator at work.   If you smoke, quit. People who smoke have an increased risk for heart attack, stroke, cancer, and other lung illnesses. Quitting is hard, but there are ways to boost your chance of quitting tobacco for good.   Use nicotine gum, patches, or lozenges.   Ask your doctor about stop-smoking programs and medicines.   Keep trying.  In addition to reducing your risk of diseases in the future, you will notice some benefits soon after you stop using tobacco. If you have shortness of breath or asthma symptoms, they will likely get better within a few weeks after you quit.   Limit how much alcohol you drink. Moderate amounts of alcohol (up to 2 drinks a day for men, 1 drink a day for women) are okay. But drinking too much can lead to liver problems, high blood pressure, and other health problems.  Family health  Ifyou have a family, there  are many things you can do together to improve your health.   Eat meals together as a family as often as possible.   Eat healthy foods. This includes fruits, vegetables, lean meats and dairy, and whole grains.   Include your family in your fitness plan. Most people think of activities such as jogging or tennis as the way to fitness, but there are many ways you and your family can be more active. Anything that makes you breathe hard and gets your heart pumping is exercise. Here are some tips:   Walk to do errands or to take your child to school or the bus.   Go for a family bike ride after dinner instead of watching TV.  Where can you learn more?  Go to https://www.cook.com/  Enter 301-096-0498 in the search box to learn more about "A Healthy Lifestyle: After Your  Visit".  Last Revised: January 02, 2012   2006-2013 Healthwise, [place]. Care instructions adapted under license by your healthcare professional. If you have questions about a medical condition or this instruction, always ask your healthcare professional. Health[employer], Inc[employer] disclaims any warranty or liability for your use of this information.    Thank you for allowing [place] to provide your care today. We hope all your concerns were met at this visit.    You may receive a brief survey in the mail within the next few weeks concerning this office visit. Your feedback is an opportunity to comment on the service experience with your care team.  Thank you for taking your time to complete and return the survey.  Your feedback is valuable to [place].    Keith Rake, CMA

## 2013-07-30 NOTE — Progress Notes (Addendum)
SUBJECTIVE:  62 year old female for follow up of diabetes. Diabetic Review of Systems - medication compliance:  compliant all of the time, diabetic diet compliance:  compliant all of the time, home glucose monitoring: is performed regularly.  Other symptoms and concerns: NONE     Current Outpatient Prescriptions   Medication Sig Dispense Refill   ??? TRAZODONE  50 MG ORAL TAB TAKE FROM 1/2 TO 2 TABLETS BY MOUTH EVERY NIGHT AT BEDTIME AS NEEDED FOR SLEEP  120  3   ??? SERTRALINE 100 MG ORAL TAB TAKE 1 TABLET BY MOUTH ONE TIME DAILY  60  3   ??? LOSARTAN  50 MG ORAL TAB TAKE 1 TABLET BY MOUTH ONE TIME DAILY  90  3   ??? CLOBETASOL 0.05% SOLN IN CERAVE TOP CREA APPLY TO AFFECTED AREA(S) OF SKIN 2 TO 3 TIMES A DAY AS NEEDED FOR ITCHING OR RASH (PLEASE REORDER 2 BUSINESS DAYS IN ADVANCE)  454  1   ??? ATENOLOL  50 MG ORAL TAB TAKE 1 TABLET BY MOUTH TWICE DAILY FOR BLOOD PRESSURE  180  2   ??? METFORMIN  500 MG ORAL TAB TAKE 1 TABLET BY MOUTH TWICE DAILY WITH FOOD FOR DIABETES  180  3   ??? SIMVASTATIN 40 MG ORAL TAB TAKE 1 TABLET BY MOUTH EVERY NIGHT AT BEDTIME  60  5   ??? PANTOPRAZOLE 40 MG ORAL TBEC DR TAB TAKE 1 TABLET BY MOUTH ONE TIME DAILY FOR 8 WEEKS  56  2   ??? VANIQA 13.9 % TOP CREA APPLY THIN FILM TWICE DAILY TO AFFECTED AREAS  30  6   ??? ONE TOUCH ULTRA TEST MISC STRIPS USE AS DIRECTED FOR DIABETES  100  5   ??? ALBUTEROL SULFATE 2.5 MG /3 ML (0.083 %) INHL NEB SOLN USE 1 VIAL VIA NEBULIZER AS DIRECTED  1  0   ??? FUROSEMIDE 20 MG ORAL TAB TAKE 1 TO 2 TABLETS BY MOUTH ONCE A  DAY PRN FOR FLUID RETENTION  100  2   ??? BD INSULIN SYRINGE ULT-FINE II 1/2 ML 31 X 5/16" MISC SYRINGE USE AS DIRECTED FORDIABETES  100  5   ??? MECLIZINE 25 MG ORAL TAB 1/2 to 1 tablet three times daily as needed for vertigo  90  0   ??? ASPIRIN   81 MG ORAL TBEC DR TAB 1 TAB PO DAILY  100  0     No current facility-administered medications for this visit.       OBJECTIVE:  Appearance: alert, well appearing, and in no distress.  BP 104/68   Pulse 68   Temp(Src)  97.8 ??F (36.6 ??C) (Oral)   Resp 16   Ht 5\' 7"  (1.702 m)   Wt 215 lb (97.523 kg)   BMI 33.67 kg/m2    Exam: heart sounds normal rate, regular rhythm, normalS1, S2, no murmurs, rubs, clicks or gallops, chest clear, no hepatosplenomegaly, no carotid bruits, feet: warm, good capillary refill  Component 09/30/2012 01/13/2013 04/09/2013 07/18/2013          CHOL 137  131 153   HDL 35 (L)  36 (L) 40 (L)   LDL DIRECT 81  66 80   HGBA1C 6.6 (H) 6.9 (H) 6.8 (H) 6.4 (H)     Component 04/09/2013 07/18/2013   GLUC 134 (H) 116 (H)   BUN 15 24 (H)   CREAT 0.51 (L) 0.81   NA 145 148   K 4.5 4.3   CL  103 104   CO2 32 (H) 28   CA 9.6 9.6   TP 7.1 7.1   ALB 4.3 4.3   ALKP 99 91   ALT 22 13   AST 23 19   TBILI 0.4 0.4   ANION GAP4 SERPL 14.5 20.3 (H)   GFR NONAFR AMER >60 >60   GFR AFR AM >60 . . . >60 . . .       Darra was seen today for diabetes care management.    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

## 2013-08-05 NOTE — Progress Notes (Addendum)
The patient, Eileen Barnes's, identity was verified by name.    Bariatric Surgery Nutrition Follow-Up: 9 mos post sleeve gastrectomy    Informant: pt    Current Outpatient Prescriptions   Medication Sig Dispense Refill   ??? ZOSTAVAX (PF) 19400 UNIT/0.65 ML SUBQ RECON SUSP AS DIRECTED  1  0   ??? LOSARTAN  25 MG ORAL TAB TAKE 3 TABLETS BY MOUTH ONE TIME DAILY  270  3   ??? ATORVASTATIN 10 MG ORAL TAB TAKE 1 TABLET BY MOUTH ONE TIME DAILY  90  3   ??? TRAZODONE  50 MG ORAL TAB TAKE FROM 1/2 TO 2 TABLETS BY MOUTH EVERY NIGHT AT BEDTIME AS NEEDED FOR SLEEP  120  3   ??? SERTRALINE 100 MG ORAL TAB TAKE 1 TABLET BY MOUTH ONE TIME DAILY  60  3   ??? LOSARTAN  50 MG ORAL TAB TAKE 1 TABLET BY MOUTH ONE TIME DAILY  90  3   ??? CLOBETASOL 0.05% SOLN IN CERAVE TOP CREA APPLY TO AFFECTED AREA(S) OF SKIN 2 TO 3 TIMES A DAY AS NEEDED FOR ITCHING OR RASH (PLEASE REORDER 2 BUSINESS DAYS IN ADVANCE)  454  1   ??? ATENOLOL  50 MG ORAL TAB TAKE 1 TABLET BY MOUTH TWICE DAILY FOR BLOOD PRESSURE  180  2   ??? METFORMIN  500 MG ORAL TAB TAKE 1 TABLET BY MOUTH TWICE DAILY WITH FOOD FOR DIABETES  180  3   ??? PANTOPRAZOLE 40 MG ORAL TBEC DR TAB TAKE 1 TABLET BY MOUTH ONE TIME DAILY FOR 8 WEEKS  56  2   ??? VANIQA 13.9 % TOP CREA APPLY THIN FILM TWICE DAILY TO AFFECTED AREAS  30  6   ??? ONE TOUCH ULTRA TEST MISC STRIPS USE AS DIRECTED FOR DIABETES  100  5   ??? ALBUTEROL SULFATE2.5 MG /3 ML (0.083 %) INHL NEB SOLN USE 1 VIAL VIA NEBULIZER AS DIRECTED  1  0   ??? FUROSEMIDE 20 MG ORAL TAB TAKE 1 TO 2 TABLETS BY MOUTH ONCE A  DAY PRN FOR FLUID RETENTION  100  2   ??? BD INSULIN SYRINGE ULT-FINE II 1/2 ML 31 X 5/16" MISC SYRINGE USE AS DIRECTED FOR DIABETES  100  5   ??? MECLIZINE 25 MG ORAL TAB 1/2 to 1 tablet three times daily as needed for vertigo  90  0   ??? ASPIRIN   81 MG ORAL TBEC DR TAB 1 TAB PO DAILY  100  0     No current facility-administered medications for this visit.     Subjective: B: oatmeal w/sf syrup and walnuts, 1/4 apple, L: pollock w/lettuce, ceasar  dressing, chock sand w/cheese 160 cal, tom soup, yogurt every day, a little milk, D: protein, veg, sm starch    Exercise:  Water aerobics 3x/week, walking    Protein:  60 g    Fluids: 64 oz.  Vitamins:   Multivitamin: 1 MV   Calcium: N    What kind?   Vitamin B12:500 mcg/day              Vitamin D 3-4,000 IU/day   Thiamin N              Iron: N   Omeprazole: N    Vomiting: N  Constipation: N, resolved  Diarrhea: N  Dumping Syndrome: N    Eating behaviors:   How many meals a day: 3   How much food ata  meal: 1 cup   How long does it take to eat meals: 30 min   How long do you wait before drinking after meals: 30 min   Snack foods: yogurt    Objective:    Wt Readings from Last 3 Encounters:   08/05/13 217 lb (98.431 kg)   07/30/13 215 lb (97.523 kg)   06/03/13 213 lb 9.6 oz (96.888 kg)       Labs: pt states surgeon rec labs again at end of November, they are onorder    GLUCOSE, RANDOM   Date Value Range    07/18/2013 116* 80-115 MG/DL Final   1/47/8295 621* 80-115 MG/DL Final   3/0/8657 846* 96-295 MG/DL Final        GLUCOSE, FASTING   Date Value Range Status   12/08/2011 154* 70-110 MG/DL Final   2/84/1324 401* 70 - 110 MG/DL Final   0/12/7251 Pending  70 - 110 MG/DL Incomplete   04/18/4402 474* 70 - 110 MG/DL Final        [id number]   Date Value Range Status   07/18/2013 6.4* 4.8-5.9 % Final      Analysis Performed by [facility] Laboratory: [address] [place],       [place] [address]   04/09/2013 6.8* 4.8-5.9 % Final      Analysis Performed by [facility] Laboratory: [address] [place],       [place] [address]   01/13/2013 6.9* 3.4 to 5.9 % Final      (NOTE)      All Ages:       A1c Value   Interpretation       < 5.7%    Non-diabetic       5.7-6.4%   Increased risk for DM       > or = 6.5%   Consistent with DM      Revised:  07/19/11      Analysis Performed by [facility] laboratory: [address] [place]., [address]        MICROALBUMIN, URINE   Date Value Range  Status   07/18/2013 11.5* <3.0 MG/DL Final   66/04/3015 <0.1  <3.0 MG/DL Final   0/07/3234 <5.7  <3.0 MG/DL Final        MICROALBUMIN/CREATININE RATIO, URINE   Date Value Range Status   07/18/2013 26.5  <30 MG/G Final      Analysis Performed by [facility] [address] [place], [place] [address]       [phone]   09/30/2012 <4.5  <30 MG/G Final   06/13/2012 <3.0  <30 MG/G Final     VIT B12   Date Value Range Status   04/09/2013 >1000 Analysis Performed by [facility] [phone] [place], [place] [address]  [phone] PG/ML Final       ALBUMIN   Date Value Range Status   07/18/2013  3.5-5.0 GM/DL Final       HGB   Date Value Range Status   04/09/2013 13.9  12.0-16.0 GM/DL Final       FOLATE   Date Value Range Status   04/09/2013 >20.0 (NOTE) NORMAL ADULTS(AGED 18-65)   2.79->20.00  NG/ML  FOLATE DEFICIENT PATIENTS   1.04-2.78 NG/ML Analysis Performed by [facility] [id number] [place], [place] [address] [phone]   Final       FERRITIN  Date Value Range Status   04/09/2013 51.6  17.9-464 NG/ML Final      Analysis Performed by [facility] [address] [place], [place] [address]       [phone]         Assessment: Pt with 3 lb wt regain over 3 mos, attributes this to recent vacation, foods available were more calories, more sauces. Overall is eating a healthy balanced diet. Has D/C supp Ca d/t constipation, even with Mg. Discussed 500 mcg B12 every other day as last lab was high. A1c improved.    Plan: PI:    Be sure to get enough calcium in your diet. Aim for three servings per day of milk, almond milk, yogurt, low fat cheese.      Patient education: calcium      F/U: 3 mos, pt to sched    Khai Arrona L. Dayton Martes MS., RD., LD

## 2013-08-05 NOTE — Patient Instructions (Addendum)
Be sure to get enough calcium in your diet. Aim for three servings per day of milk, almond milk, yogurt, low fat cheese.

## 2013-08-08 ENCOUNTER — Encounter

## 2013-08-12 NOTE — Progress Notes (Addendum)
As part of the 08/13/2013 system conversion from [facility] to [facility], a Bariatric Surgery Case has been created in [facility]. This case was originally opened in [facility] on date of surgery 10/14/2012. Next review date for follow-up has been set in [employer]. Primary intent for this next scheduled follow up is post op review by Enid Cutter RN, Bariatric Case Manager.

## 2013-08-13 MED ORDER — SERTRALINE HCL 100 MG PO TABS
100 MG | ORAL_TABLET | ORAL | Status: DC
Start: 2013-08-13 — End: 2013-08-14

## 2013-08-13 NOTE — Telephone Encounter (Signed)
From: Korinna A Sebek  To: Doralee Albino Mersadie Kavanaugh  Sent: 08/08/2013 7:02 AM EDT  Subject: Medication Renewal Request    Original authorizing provider: Adrienne Mocha    Eileen Barnes would like a refill of the following medications:  sertraline (ZOLOFT) 100 MG tablet [[name]]    Preferred pharmacy: Other - [facility] (mail order pharmacy)    Comment:

## 2013-08-14 MED ORDER — SERTRALINE HCL 100 MG PO TABS
100 MG | ORAL_TABLET | ORAL | Status: DC
Start: 2013-08-14 — End: 2014-03-03

## 2013-08-29 NOTE — Telephone Encounter (Signed)
Office Visit    Chief Complaint: Bariatric f/u    With Provider: Dr Benjaman KindlerNowak    Department: Surgery    Preferred Days:  Monday, Tuesday, Wednesday, Thursday and Friday    Preferred Time:  AM and PM    [place] [facility]    Preferred Appointment: Based on template review appointment is being forwarded to the department for scheduling assistance    Contact Phone Number: [phone] (home)     Patient advised of office/PCP has 24 - 48 business hours to return their call.

## 2013-09-01 NOTE — Telephone Encounter (Signed)
msg left w/ spouse to have mbr cb to schedule 12 mo f/u bariatric, dos 10/14/12 lap sleeve

## 2013-09-01 NOTE — Telephone Encounter (Signed)
mbr returned call, appt made and accepted.

## 2013-09-09 NOTE — Patient Instructions (Signed)
Continue your current medications for now. Your physician will review your blood pressure reading and current medications. You will be contacted if any changes or follow up appointment is needed. Call sooner for any concerns.

## 2013-09-09 NOTE — Progress Notes (Signed)
Discussed with Dr Dardir & patient to try 50 Mgs Daily . I called her & spoke with her about the dose & she plans on getting a BP Cuff & will monitor her BP values while taking the 50 mgs - C. Eileen Barnes CRNP

## 2013-09-09 NOTE — Progress Notes (Signed)
The patient, Eileen Barnes, identity was verified by name and MRN. Supervising provider for clinic visit: Dr Dardir. States she was told to increase Losartan at last visit from 50mg  to 75mg . She is questioning that because her bp was low at that visit, 104/68. She has lost 80 lbs. She has been taking 25mg  daily instead, bp has gone up. She is questioning what is the correct dose. Her home monitor is not working, she will replace it. Discharged with avs reviewed.      Chief Complaint   Patient presents with   ??? Blood Pressure Check     Reason for BP visit: Medication change  Outpatient Prescriptions Marked as Taking for the 09/09/13 encounter (Nurse Only) with Schedule, [facility]   Medication Sig Dispense Refill   ??? sertraline (ZOLOFT) 100 MG tablet TAKE 1 TABLET BY MOUTH ONE TIME DAILY  60 tablet  2   ??? traZODone (DESYREL) 50 MG tablet TAKE FROM 1/2 TO 2 TABLETS BY MOUTH EVERY NIGHT AT BEDTIME AS NEEDED FOR SLEEP  120  3   ??? Clobetasol Prop Crea-Coal Tar (CLOBETA CREAM EX) APPLY TO AFFECTED AREA(S) OF SKIN 2 TO 3 TIMES A DAY AS NEEDED FOR ITCHING OR RASH (PLEASE REORDER 2 BUSINESS DAYS IN ADVANCE)  454  1   ??? atenolol (TENORMIN) 50 MG tablet TAKE 1 TABLET BY MOUTH TWICE DAILY FOR BLOOD PRESSURE  180  2   ??? simvastatin (ZOCOR) 40 MG tablet TAKE 1 TABLET BY MOUTH EVERY NIGHT AT BEDTIME  60  5   ??? Eflornithine HCl 13.9 % CREA APPLY THIN FILM TWICE DAILY TO AFFECTED AREAS  30  6     BP 140/76   Pulse 60 second reading 146/76    BP Readings from Last 3 Encounters:   09/09/13 140/76   07/30/13 104/68   06/03/13 128/60     Pulse Readings from Last 3 Encounters:   09/09/13 60   07/30/13 68   06/03/13 78       Patient states compliant with medications: see med notes  Is patient taking ASA? yes If yes, document in Historical Meds.  Where does patient pick-up medications: home  Phone number where patient can be reached: home  Chart sent to provider for review.

## 2013-09-10 NOTE — Care Coordination-Inpatient (Signed)
bariatric 1 year OV scheduled 10/15/13

## 2013-10-06 LAB — COMPREHENSIVE METABOLIC PANEL
ALT: 14 U/L (ref 9–52)
AST: 20 U/L (ref 14–48)
Albumin: 4.2 g/dL (ref 3.5–5.0)
Alkaline Phosphatase: 80 U/L (ref 38–126)
Anion Gap: 18 mmol/L (ref 7–18)
BUN: 17 mg/dL (ref 7–17)
CO2: 30 mmol/L (ref 22–31)
Calcium: 9.4 mg/dL (ref 8.4–10.2)
Chloride: 103 mmol/L (ref 99–111)
Creatinine: 0.57 mg/dL (ref 0.52–1.04)
GFR African American: >60 mL/min/{1.73_m2} (ref >60)
GFR Non-African American: >60 mL/min/{1.73_m2} (ref >60)
Glucose: 179 mg/dL — ABNORMAL HIGH (ref 80–115)
Potassium: 4.2 mmol/L (ref 3.5–5.0)
Sodium: 147 mmol/L (ref 135–150)
Total Bilirubin: 0.5 mg/dL (ref 0.2–1.3)
Total Protein: 7 g/dL (ref 6.5–8.8)

## 2013-10-06 LAB — CBC WITH AUTO DIFFERENTIAL
Basophils %: 0.6 % (ref 0.0–1.5)
Basophils Absolute: 0.1 10*3/uL (ref 0.0–0.2)
Eosinophils %: 4.7 % (ref 0.0–8.0)
Eosinophils Absolute: 0.4 10*3/uL (ref 0.0–0.7)
Hematocrit: 42.3 % (ref 36.0–48.0)
Hemoglobin: 14.2 g/dL (ref 12.0–16.0)
Lymphocytes %: 28.6 % (ref 21.8–41.6)
Lymphocytes Absolute: 2.7 10*3/uL (ref 0.7–3.3)
MCH: 30.2 pg (ref 26.0–34.0)
MCHC: 33.7 % (ref 32.0–36.0)
MCV: 89.7 fL (ref 80.0–100.0)
Monocytes %: 4.9 % — ABNORMAL LOW (ref 5.7–14.0)
Monocytes Absolute: 0.5 10*3/uL (ref 0.2–0.9)
Neutrophils %: 61.2 % (ref 49.2–70.4)
Neutrophils Absolute: 5.7 10*3/uL (ref 1.5–5.8)
Platelets: 175 10*3/uL (ref 150–450)
RBC: 4.71 M/uL (ref 4.00–5.30)
RDW: 13 % (ref 11.5–15.5)
WBC: 9.4 10*3/uL (ref 4.5–11.0)

## 2013-10-06 LAB — LIPID PANEL
Cholesterol, Total: 163 mg/dL (ref <200)
HDL: 47 mg/dL (ref >40)
LDL Calculated: 79 mg/dL
Triglycerides: 183 mg/dL — ABNORMAL HIGH (ref <150)

## 2013-10-06 LAB — FOLATE: Folate: >20 ng/mL (ref 0.62–20.00)

## 2013-10-06 LAB — IRON AND TIBC
Iron Saturation: 24 % (ref 14–34)
Iron: 86 ug/dL (ref 37–170)
TIBC: 355 ug/dL (ref 265–497)

## 2013-10-06 LAB — FERRITIN: Ferritin: 45.3 ng/mL (ref 17.9–464.0)

## 2013-10-06 LAB — VITAMIN B12: Vitamin B-12: 878 pg/mL (ref 239–931)

## 2013-10-07 LAB — HEMOGLOBIN A1C: Hemoglobin A1C: 7 % — ABNORMAL HIGH (ref 4.8–5.9)

## 2013-10-08 LAB — VITAMIN D 25 HYDROXY: Vit D, 25-Hydroxy: 47.4 ng/mL (ref 30.0–100.0)

## 2013-10-08 LAB — PREALBUMIN: Prealbumin: 22.1 mg/dL (ref 20.0–40.0)

## 2013-10-14 ENCOUNTER — Encounter

## 2013-10-14 NOTE — Telephone Encounter (Signed)
Name of medication:     atenolol (TENORMIN) 50 MG tablet 180 2 12/30/2012 12/30/2013     Sig - Route: TAKE 1 TABLET BY MOUTH TWICE DAILY FOR BLOOD PRESSURE - Oral        [facility]    [facility]    Pharmacy Phone Number: [phone]    Contact number for member: [phone] (home)     Patient instructed to contact the pharmacy prior to picking up the medication.    Patient advised that office/PCP has 24-48 business hours to return their call.

## 2013-10-15 MED ORDER — ATENOLOL 50 MG PO TABS
50 MG | ORAL_TABLET | ORAL | Status: DC
Start: 2013-10-15 — End: 2014-07-15

## 2013-10-15 NOTE — Progress Notes (Signed)
The patient, Eileen Barnes, identity was verified by name and MRN.    Eileen Barnes is a status post bariatric surgery and is here for one year followup visit.  Weight loss is fair.  She has gained weight since last visit.  Protein intake is good.  Fluid intake is good.      Past Medical History   Diagnosis Date   ??? Severe obesity (BMI >= 40) (HCC)      07/09/2004  : MORBID OBESITY   ??? AK (actinic keratosis)      02/12/2003  : ACTINIC KERATOSIS   ??? 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 Essential hypertension    ??? Hyperlipidemia    ??? Gout    ??? DM type 2 with diabetic peripheral neuropathy (HCC)      08/03/2006    ??? Panic disorder with agoraphobia      07/22/2010    ??? GERD (gastroesophageal reflux disease)    ??? Internal hemorrhoid      08/07/2012    ??? History of colonic polyps      08/07/2012        Past Surgical History   Procedure Laterality Date   ??? Lasik     ??? Other surgical history       : appendectomy    ??? Other surgical history       : Tonsillectomy    ??? Other surgical history       : D&C    ??? Laparoscopic sleeve gastrectomy       10/14/2012        Allergies   Allergen Reactions   ??? Hydrocodone-Acetaminophen      Vomiting   ??? Kiwi Extract      Pt. States throat closes up   ??? Lisinopril      Cough   ??? Lovastatin    ??? Morphine And Related Nausea And Vomiting   ??? Nortriptyline      Severe constipation   ??? Pentazocine Nausea And Vomiting   ??? Tramadol Nausea And Vomiting       ROS    Hair Loss no  Leg Cramps no  Skin Rash no  Abdominal pain no  Constipation no  Dry mouth no  Diarrhea no  Binge eating no  Memory loss no  Sweating no  Tremor no  Drowsiness no  Fatigue no  Insomnia no  Nausea/vomiting no  Depression no  Chest pain no  Shortness of breath no      PE:    BP 154/74   Pulse 58   Temp(Src) 97.6 ??F (36.4 ??C) (Oral)   Resp 16   Wt 221 lb (100.245 kg)    GENERAL APPEARANCE: alert, oriented, no acute distress and obese  SKIN: well hydrated  ABDOMEN: soft, nontender, nondistended, no  masses or organomegaly    Lab Results   Component Value Date    WBC 9.4 10/06/2013    HGB 14.2 10/06/2013    HCT 42.3 10/06/2013    MCV 89.7 10/06/2013    PLT 175 10/06/2013     Lab Results   Component Value Date    NA 147 10/06/2013    K 4.2 10/06/2013    CL 103 10/06/2013    CO2 30 10/06/2013     Lab Results   Component Value Date    ALT 14 10/06/2013    AST 20 10/06/2013    ALKPHOS 80 10/06/2013  BILITOT 0.5 10/06/2013     Lab Results   Component Value Date    IRON 86 10/06/2013    TIBC 355 10/06/2013    FERRITIN 45.3 10/06/2013     Lab Results   Component Value Date    VITAMINB12 878 10/06/2013         1. Severe obesity (BMI >= 40) (HCC)  Weight gain since last visit.  Has not seen dietary in six months, advised to do so.  States she is snacking between meals and knows that she needs to stop    2. DM (diabetes mellitus), type 2  On metformin.  Knows she should be eating better    3. HTN (hypertension)  Off medication    4. GERD (gastroesophageal reflux disease)  Off medication, no symptoms    5. Hx of gastric bypass  stable      FOLLOW-UP:  One year with labs

## 2013-10-15 NOTE — Telephone Encounter (Signed)
Please call pt to schedule BP check. Pt had elevated BP reading (154/74) at [facility] office. Pt declined nurse clinic appt at [facility] and I am unable to schedule on [facility] nurse clinic schedule. Thank you.

## 2013-10-15 NOTE — Telephone Encounter (Signed)
Appt was already scheduled.  End to chart.

## 2013-10-15 NOTE — Patient Instructions (Signed)
Follow up in 1 year with the General Surgery department    Do you understand your plan of care today? Is there anything else I can help you with today? You may be getting a survey in the mail regarding your care today. If you do please take a few minutes to fill it out and mail it back to [place]. It would be greatly appreciated.

## 2013-10-15 NOTE — Progress Notes (Signed)
The patient, Eileen Barnes, identity was verified by name and MRN. Supervising provider for clinic visit: Dr. Benjaman Kindler.     Chaperone status: offered, declined    Chief Complaint   Patient presents with   ??? Follow-up     bariatric surgery--DOS-10/2012        Allergies and medications reviewed with pt   Pt measured within the past year-pt 5'7"    An After Visit Summary was printed and given to the patient.  1 year recall placed per Dr. Benjaman Kindler  Second BP remained elevated, pt declined nurse clinic appt at [facility]. Pt states she will call [place] facility to schedule a BP check. Message sent to facility to call pt to schedule BP check (I am unable to schedule on the nurse schedule at [facility]). Thank you.

## 2013-10-16 NOTE — Patient Instructions (Signed)
Start logging again and aim for 1,200 calories per day. Weigh yourself once per week.

## 2013-10-16 NOTE — Progress Notes (Signed)
The patient, Eileen Barnes's, identity was verified by name.      Bariatric Surgery Nutrition Follow-Up    Informant:    Post operative month: 1 year   Current Outpatient Prescriptions   Medication Sig Dispense Refill   ??? atenolol (TENORMIN) 50 MG tablet TAKE 1 TABLET BY MOUTH TWICE DAILY FOR BLOOD PRESSURE  180 tablet  2   ??? aspirin 81 MG EC tablet Take 81 mg by mouth daily.       ??? Cholecalciferol (VITAMIN D) 2000 UNITS CAPS capsule Take 2 capsules by mouth.       ??? sertraline (ZOLOFT) 100 MG tablet TAKE 1 TABLET BY MOUTH ONE TIME DAILY  60 tablet  2   ??? losartan (COZAAR) 25 MG tablet TAKE 3 TABLETS BY MOUTH ONE TIME DAILY  270  3   ??? atorvastatin (LIPITOR) 10 MG tablet TAKE 1 TABLET BY MOUTH ONE TIME DAILY  90  3   ??? traZODone (DESYREL) 50 MG tablet TAKE FROM 1/2 TO 2 TABLETS BY MOUTH EVERY NIGHT AT BEDTIME AS NEEDED FOR SLEEP  120  3   ??? losartan (COZAAR) 50 MG tablet TAKE 1 TABLET BY MOUTH ONE TIME DAILY  90  3   ??? Clobetasol Prop Crea-Coal Tar (CLOBETA CREAM EX) APPLY TO AFFECTED AREA(S) OF SKIN 2 TO 3 TIMES A DAY AS NEEDED FOR ITCHING OR RASH (PLEASE REORDER 2 BUSINESS DAYS IN ADVANCE)  454  1   ??? metFORMIN (GLUCOPHAGE) 500 MG tablet TAKE 1 TABLET BY MOUTH TWICE DAILY WITH FOOD FOR DIABETES  180  3   ??? simvastatin (ZOCOR) 40 MG tablet TAKE 1 TABLET BY MOUTH EVERY NIGHT AT BEDTIME  60  5   ??? pantoprazole (PROTONIX) 40 MG tablet TAKE 1 TABLET BY MOUTH ONE TIME DAILY FOR 8 WEEKS  56  2   ??? Glucose Blood (BLOOD GLUCOSE TEST STRIPS) STRP USE AS DIRECTED FOR DIABETES  100  5   ??? albuterol (PROVENTIL) (2.5 MG/3ML) 0.083% nebulizer solution USE 1 VIAL VIA NEBULIZER AS DIRECTED  1  0   ??? furosemide (LASIX) 20 MG tablet TAKE 1 TO 2 TABLETS BY MOUTH ONCE A  DAY PRN FOR FLUID RETENTION  100  2   ??? Insulin Syringe-Needle U-100 31G X 5/16" 0.5 ML MISC USE AS DIRECTED FOR DIABETES  100  5   ??? Glucose Blood (BLOOD GLUCOSE TEST STRIPS) STRP USE AS DIRECTED BY PHYSICIAN FOR DIABETES  200  6   ??? Cholecalciferol 2000 UNITS  TABS Take 1-1-1/2 tabs po qd  45  3     No current facility-administered medications for this visit.     Subjective: Feels good.      Exercise:  PA: water aerobics 3x/weeek    Protein:  60-70 g    Fluids: 64 oz.  Vitamins:   Multivitamin: MV w/iron   Calcium:N     What kind?N   Vitamin B12: N              Vitamin D 4,000 IU   Thiamin N              B-Complex N   Iron: n   Omeprazole: N    Vomiting: n  Constipation: n  Diarrhea: n  Dumping Syndrome: n    Eating behaviors:   How many meals a day: 3   How much food at a meal: 1.5 cups   How long does it take to eat meals: 20-25 min  How long do you wait before drinking after meals: 30   Snack foods: cheese,and crackers  Objective:    Wt Readings from Last 3 Encounters:   10/15/13 221 lb (100.245 kg)   08/05/13 217 lb (98.431 kg)   07/30/13 215 lb (97.523 kg)         Component      Latest Ref Rng 10/06/2013 10/06/2013 10/06/2013 10/06/2013          10:58 AM 10:58 AM 10:58 AM 10:58 AM   WBC      4.5 - 11.0 K/uL    9.4   RBC      4.00 - 5.30 M/uL    4.71   Hemoglobin Quant      12.0 - 16.0 g/dL    78.4   Hematocrit      36.0 - 48.0 %    42.3   MCV      80.0 - 100.0 fL    89.7   MCH      26.0 - 34.0 pg    30.2   MCHC      32.0 - 36.0 %    33.7   RDW      11.5 - 15.5 %    13.0   Platelet Count      150 - 450 K/uL    175   Neutrophils Relative      49.2 - 70.4 %    61.2   Lymphocyte %      21.8 - 41.6 %    28.6   Monocytes Relative      5.7 - 14.0 %    4.9 (L)   Eosinophils Relative Percent      0.0 - 8.0 %    4.7   Basophils Relative      0.0 - 1.5 %    0.6   Neutrophils Absolute      1.5 - 5.8 K/uL    5.7   Lymphocytes Absolute      0.7 - 3.3 K/uL    2.7   Monocytes Absolute      0.2 - 0.9 K/uL    0.5   Eosinophils Absolute      0.0 - 0.7 K/uL    0.4   Basophils Absolute      0.0 - 0.2 K/uL    0.1   Sodium      135 - 150 mmol/L  147     Potassium      3.5 - 5.0 mmol/L  4.2     Chloride      99 - 111 mmol/L  103     CO2      22 - 31 mmol/L  30     Anion Gap      7 - 18  mmol/L  18     Glucose      80 - 115 mg/dL  696 (H)     BUN      7 - 17 mg/dL  17     Creatinine      0.52 - 1.04 mg/dL  2.95     GFR Non-African American      >60 mL/min/1.73  >60     GFR African American      >60 mL/min/1.73  >60     Calcium      8.4 - 10.2 mg/dL  9.4     Total Protein      6.5 - 8.8 g/dL  7.0  Albumin      3.5 - 5.0 g/dL  4.2     Bilirubin      0.2 - 1.3 mg/dL  0.5     Alk Phos      38 - 126 U/L  80     ALT      9 - 52 U/L  14     AST      14 - 48 U/L  20     Cholesterol, Total      <200 mg/dL       Triglycerides      <150 mg/dL       HDL Cholesterol      >40 mg/dL       LDL Calculated             Iron      37 - 170 ug/dL       TIBC      045 - 409 ug/dL       Iron Saturation      14 - 34 %       Prealbumin      20.0 - 40.0 mg/dL 81.1      Vitamin B-14      239 - 931 pg/mL   878    Folate      0.62 - 20.00 ng/mL       Ferritin      17.9 - 464.0 ng/mL       Vit D, 25-Hydroxy      30.0 - 100.0 ng/mL       Hemoglobin A1C      4.8 - 5.9 %         Component      Latest Ref Rng 10/06/2013 10/06/2013 10/06/2013 10/06/2013          10:58 AM 10:58 AM 10:58 AM 10:58 AM   WBC      4.5 - 11.0 K/uL       RBC      4.00 - 5.30 M/uL       Hemoglobin Quant      12.0 - 16.0 g/dL       Hematocrit      78.2 - 48.0 %       MCV      80.0 - 100.0 fL       MCH      26.0 - 34.0 pg       MCHC      32.0 - 36.0 %       RDW      11.5 - 15.5 %       Platelet Count      150 - 450 K/uL       Neutrophils Relative      49.2 - 70.4 %       Lymphocyte %      21.8 - 41.6 %       Monocytes Relative      5.7 - 14.0 %       Eosinophils Relative Percent      0.0 - 8.0 %       Basophils Relative      0.0 - 1.5 %       Neutrophils Absolute      1.5 - 5.8 K/uL       Lymphocytes Absolute      0.7 - 3.3 K/uL       Monocytes Absolute      0.2 - 0.9 K/uL  Eosinophils Absolute      0.0 - 0.7 K/uL       Basophils Absolute      0.0 - 0.2 K/uL       Sodium      135 - 150 mmol/L       Potassium      3.5 - 5.0 mmol/L       Chloride      99 -  111 mmol/L       CO2      22 - 31 mmol/L       Anion Gap      7 - 18 mmol/L       Glucose      80 - 115 mg/dL       BUN      7 - 17 mg/dL       Creatinine      1.61 - 1.04 mg/dL       GFR Non-African American      >60 mL/min/1.73       GFR African American      >60 mL/min/1.73       Calcium      8.4 - 10.2 mg/dL       Total Protein      6.5 - 8.8 g/dL       Albumin      3.5 - 5.0 g/dL       Bilirubin      0.2 - 1.3 mg/dL       Alk Phos      38 - 126 U/L       ALT      9 - 52 U/L       AST      14 - 48 U/L       Cholesterol, Total      <200 mg/dL       Triglycerides      <150 mg/dL       HDL Cholesterol      >40 mg/dL       LDL Calculated             Iron      37 - 170 ug/dL  86     TIBC      096 - 497 ug/dL  045     Iron Saturation      14 - 34 %  24     Prealbumin      20.0 - 40.0 mg/dL       Vitamin W-09      239 - 931 pg/mL       Folate      0.62 - 20.00 ng/mL >20.00      Ferritin      17.9 - 464.0 ng/mL   45.3    Vit D, 25-Hydroxy      30.0 - 100.0 ng/mL    47.4   Hemoglobin A1C      4.8 - 5.9 %         Component      Latest Ref Rng 10/06/2013 10/06/2013          10:58 AM 10:58 AM   WBC      4.5 - 11.0 K/uL     RBC      4.00 - 5.30 M/uL     Hemoglobin Quant      12.0 - 16.0 g/dL     Hematocrit      81.1 - 48.0 %  MCV      80.0 - 100.0 fL     MCH      26.0 - 34.0 pg     MCHC      32.0 - 36.0 %     RDW      11.5 - 15.5 %     Platelet Count      150 - 450 K/uL     Neutrophils Relative      49.2 - 70.4 %     Lymphocyte %      21.8 - 41.6 %     Monocytes Relative      5.7 - 14.0 %     Eosinophils Relative Percent      0.0 - 8.0 %     Basophils Relative      0.0 - 1.5 %     Neutrophils Absolute      1.5 - 5.8 K/uL     Lymphocytes Absolute      0.7 - 3.3 K/uL     Monocytes Absolute      0.2 - 0.9 K/uL     Eosinophils Absolute      0.0 - 0.7 K/uL     Basophils Absolute      0.0 - 0.2 K/uL     Sodium      135 - 150 mmol/L     Potassium      3.5 - 5.0 mmol/L     Chloride      99 - 111 mmol/L     CO2      22 - 31 mmol/L      Anion Gap      7 - 18 mmol/L     Glucose      80 - 115 mg/dL     BUN      7 - 17 mg/dL     Creatinine      1.61 - 1.04 mg/dL     GFR Non-African American      >60 mL/min/1.73     GFR African American      >60 mL/min/1.73     Calcium      8.4 - 10.2 mg/dL     Total Protein      6.5 - 8.8 g/dL     Albumin      3.5 - 5.0 g/dL     Bilirubin      0.2 - 1.3 mg/dL     Alk Phos      38 - 126 U/L     ALT      9 - 52 U/L     AST      14 - 48 U/L     Cholesterol, Total      <200 mg/dL 096    Triglycerides      <150 mg/dL 045 (H)    HDL Cholesterol      >40 mg/dL 47    LDL Calculated       79    Iron      37 - 170 ug/dL     TIBC      409 - 811 ug/dL     Iron Saturation      14 - 34 %     Prealbumin      20.0 - 40.0 mg/dL     Vitamin B-14      782 - 931 pg/mL     Folate      0.62 - 20.00 ng/mL  Ferritin      17.9 - 464.0 ng/mL     Vit D, 25-Hydroxy      30.0 - 100.0 ng/mL     Hemoglobin A1C      4.8 - 5.9 %  7.0 (H)     Lab Results   Component Value Date    VITAMINB12 878 10/06/2013     Lab Results   Component Value Date    LABALBU 4.2 10/06/2013     Lab Results   Component Value Date    HGB 14.2 10/06/2013     Lab Results   Component Value Date    FOLATE >20.00 10/06/2013     Lab Results   Component Value Date    FERRITIN 45.3 10/06/2013     Wt Readings from Last 3 Encounters:   10/15/13 221 lb (100.245 kg)   08/05/13 217 lb (98.431 kg)   07/30/13 215 lb (97.523 kg)     Assessment: Pt's diet still is healthy and balanced - eats protein with meals. 4 lb wt gain over 2 mos most likely r/t pt eating more sweets (Halloween) and granola bars for snacks and is trying to make meatless meals 3x/week but since husband is a Tree surgeon, has been making quiche and other higher calorie dishes as he will not eat leaner proteins like chicken or fish or tofu    Plan: Pt to  log and aim for 1,200 calories per day.   Also agrees to weigh herself 1-2x/week.    Patient education: see patient instruction    F/U: 6 months, pt agrees to  make earlier if needed    Nunzio Cory, MS, RD, LD

## 2013-10-23 ENCOUNTER — Telehealth

## 2013-10-23 MED ORDER — METFORMIN HCL 500 MG PO TABS
500 MG | ORAL_TABLET | ORAL | Status: DC
Start: 2013-10-23 — End: 2014-02-12

## 2013-10-23 NOTE — Patient Instructions (Signed)
Continue your current medications for now. Your physician will review your blood pressure reading and current medications. You will be contacted if any changes or follow up appointment is needed. Call sooner for any concerns.

## 2013-10-23 NOTE — Progress Notes (Signed)
The patient, Eileen Barnes, identity was verified by name and MRN. Supervising provider for clinic visit: Dr Dardir.     Chief Complaint   Patient presents with   ??? Blood Pressure Check     Reason for BP visit: Medication change. Taking Losartan 50mg  daily. Denies adverse symptoms. Home readings range 130's/70's to 140's/80's.   She will be leaving in 2 weeks to go out of state for 2 months. Advised she will be contacted if new orders needed.     Outpatient Prescriptions Marked as Taking for the 10/23/13 encounter (Nurse Only) with Schedule, [facility]   Medication Sig Dispense Refill   ??? atenolol (TENORMIN) 50 MG tablet TAKE 1 TABLET BY MOUTH TWICE DAILY FOR BLOOD PRESSURE  180 tablet  2   ??? aspirin 81 MG EC tablet Take 81 mg by mouth daily.       ??? Cholecalciferol (VITAMIN D) 2000 UNITS CAPS capsule Take 2 capsules by mouth.       ??? losartan (COZAAR) 25 MG tablet TAKE 3 TABLETS BY MOUTH ONE TIME DAILY  270  3   ??? atorvastatin (LIPITOR) 10 MG tablet TAKE 1 TABLET BY MOUTH ONE TIME DAILY  90  3   ??? metFORMIN (GLUCOPHAGE) 500 MG tablet TAKE 1 TABLET BY MOUTH TWICE DAILY WITH FOOD FOR DIABETES  180  3     BP 146/74   Pulse 60  BP Readings from Last 3 Encounters:   10/23/13 146/74   10/15/13 154/74   09/09/13 146/76     Pulse Readings from Last 3 Encounters:   10/23/13 60   10/15/13 58   09/09/13 60       Patient states compliant with medications: Yes  Is patient taking ASA? Yes If yes, document in Historical Meds.  Where does patient pick-up medications: [facility]  Phone number where patient can be reached: home  Chart sent to provider for review.

## 2013-10-23 NOTE — Telephone Encounter (Signed)
Please assist  Order pended

## 2013-10-23 NOTE — Telephone Encounter (Signed)
Name of medication:  metFORMIN (GLUCOPHAGE) 500 MG tablet    Current Sig: Sig - Route: TAKE 1 TABLET BY MOUTH TWICE DAILY WITH FOOD FOR DIABETES - Oral    Pharmacy Name:  [facility]  Mail order    Pharmacy Address:  Mail order    Pharmacy Phone Number: mail order    Contact number for member: [phone] (home)     Patient instructed to contact the pharmacy prior to picking up the medication.    Patient advised that office/PCP has 24-48 business hours to return their call.

## 2013-10-27 NOTE — Progress Notes (Signed)
May continue current Medication . Return for follow up in 3-4  months. Recall placed - C. Marcianne Ozbun CRNP

## 2013-11-06 ENCOUNTER — Encounter (HOSPITAL_COMMUNITY): Payer: Self-pay | Admitting: Emergency Medicine

## 2013-11-06 ENCOUNTER — Emergency Department (HOSPITAL_COMMUNITY)
Admission: EM | Admit: 2013-11-06 | Discharge: 2013-11-06 | Disposition: A | Discharge status: Home / Self Care | Payer: BC Managed Care – PPO | Attending: Emergency Medicine | Admitting: Emergency Medicine

## 2013-11-06 ENCOUNTER — Emergency Department (HOSPITAL_COMMUNITY): Payer: BC Managed Care – PPO

## 2013-11-06 DIAGNOSIS — Y939 Activity, unspecified: Secondary | ICD-10-CM | POA: Insufficient documentation

## 2013-11-06 DIAGNOSIS — S46912A Strain of unspecified muscle, fascia and tendon at shoulder and upper arm level, left arm, initial encounter: Secondary | ICD-10-CM

## 2013-11-06 DIAGNOSIS — IMO0002 Reserved for concepts with insufficient information to code with codable children: Secondary | ICD-10-CM | POA: Insufficient documentation

## 2013-11-06 DIAGNOSIS — M542 Cervicalgia: Secondary | ICD-10-CM | POA: Insufficient documentation

## 2013-11-06 DIAGNOSIS — J069 Acute upper respiratory infection, unspecified: Secondary | ICD-10-CM | POA: Insufficient documentation

## 2013-11-06 DIAGNOSIS — X58XXXA Exposure to other specified factors, initial encounter: Secondary | ICD-10-CM | POA: Insufficient documentation

## 2013-11-06 DIAGNOSIS — Z880 Allergy status to penicillin: Secondary | ICD-10-CM | POA: Insufficient documentation

## 2013-11-06 DIAGNOSIS — E079 Disorder of thyroid, unspecified: Secondary | ICD-10-CM | POA: Insufficient documentation

## 2013-11-06 DIAGNOSIS — E785 Hyperlipidemia, unspecified: Secondary | ICD-10-CM | POA: Insufficient documentation

## 2013-11-06 DIAGNOSIS — Z882 Allergy status to sulfonamides status: Secondary | ICD-10-CM | POA: Insufficient documentation

## 2013-11-06 DIAGNOSIS — IMO0001 Reserved for inherently not codable concepts without codable children: Secondary | ICD-10-CM | POA: Insufficient documentation

## 2013-11-06 DIAGNOSIS — Y929 Unspecified place or not applicable: Secondary | ICD-10-CM | POA: Insufficient documentation

## 2013-11-06 LAB — CBC WITH DIFFERENTIAL/PLATELET
Basophils Relative: 1 % (ref 0–1)
HCT: 37.2 % (ref 36.0–46.0)
Hemoglobin: 12.5 g/dL (ref 12.0–15.0)
Lymphocytes Relative: 34 % (ref 12–46)
MCHC: 33.6 g/dL (ref 30.0–36.0)
Monocytes Absolute: 0.7 10*3/uL (ref 0.1–1.0)
Monocytes Relative: 12 % (ref 3–12)
Neutro Abs: 3 10*3/uL (ref 1.7–7.7)

## 2013-11-06 LAB — POCT I-STAT, CHEM 8
Chloride: 103 mEq/L (ref 96–112)
Glucose, Bld: 96 mg/dL (ref 70–99)
HCT: 40 % (ref 36.0–46.0)
Hemoglobin: 13.6 g/dL (ref 12.0–15.0)
Potassium: 4.7 mEq/L (ref 3.5–5.1)
TCO2: 32 mmol/L (ref 0–100)

## 2013-11-06 LAB — POCT I-STAT TROPONIN I: Troponin i, poc: 0 ng/mL (ref 0.00–0.08)

## 2013-11-06 MED ORDER — HYDROCODONE-ACETAMINOPHEN 5-325 MG PO TABS
1.0000 | ORAL_TABLET | Freq: Once | ORAL | Status: AC
  Administered 2013-11-06: 1 via ORAL
  Filled 2013-11-06: qty 1

## 2013-11-06 MED ORDER — HYDROCODONE-ACETAMINOPHEN 5-325 MG PO TABS: 1.0000 | ORAL_TABLET | ORAL | Status: DC | PRN

## 2013-11-06 NOTE — ED Notes (Signed)
PA at bedside.

## 2013-11-06 NOTE — ED Provider Notes (Signed)
CSN: [id number]     Arrival date & time 11/06/13  0500 History   First MD Initiated Contact with Patient 11/06/13 [phone]     Chief Complaint  Patient presents with  . Shoulder Pain   (Consider location/radiation/quality/duration/timing/severity/associated sxs/prior Treatment) HPI Comments: Patient is otherwise healthy 62 year old female who presents to the ED with left posterior shoulder pain.  She states that she has been having nasal congestion, cough with thick yellow sputum production for the past several days when she began to notice left shoulder pain, worse with movement and coughing.  She states that she pain radiates some into her neck.  She reports husband has been sick.  She denies chest pain, shortness of breath, nausea, vomiting, abdominal pain, diarrhea, or constipation.  Patient is a 62 y.o. female presenting with shoulder pain. The history is provided by the patient. No language interpreter was used.  Shoulder Pain This is a new problem. The current episode started in the past 7 days. The problem occurs constantly. The problem has been unchanged. Associated symptoms include arthralgias, congestion, coughing, myalgias and neck pain. Pertinent negatives include no abdominal pain, chest pain, chills, diaphoresis, fever, headaches, nausea, numbness, rash, sore throat, vertigo, visual change, vomiting or weakness. Nothing aggravates the symptoms. She has tried nothing for the symptoms. The treatment provided no relief.    Past Medical History  Diagnosis Date  . Hyperlipidemia 02/2002  . Thyroid disease 04/2002   Past Surgical History  Procedure Laterality Date  . Tonsillectomy  1950  . Pilonidal cystectomy  1970  . Laparoscopic ovarian cystectomy  1976  . Vaginal hysterectomy  1987    with bilateral oopherectomy  . Sigmoidoscopy  1999    [place]  . Colonoscopy  04/2004    no findings, internal hemrroids--Dr. Russella Dar [facility])   Family History  Problem Relation Age of  Onset  . Hypertension Mother   . Arthritis Mother   . Lymphoma Mother   . Cancer Father     colon  . Hypertension Sister   . Alcohol abuse Brother   . Hypertension Sister    History  Substance Use Topics  . Smoking status: Never Smoker   . Smokeless tobacco: Never Used  . Alcohol Use: No   OB History   Grav Para Term Preterm Abortions TAB SAB Ect Mult Living                 Review of Systems  Constitutional: Negative for fever, chills and diaphoresis.  HENT: Positive for congestion. Negative for sore throat.   Respiratory: Positive for cough.   Cardiovascular: Negative for chest pain.  Gastrointestinal: Negative for nausea, vomiting and abdominal pain.  Musculoskeletal: Positive for arthralgias, myalgias and neck pain.  Skin: Negative for rash.  Neurological: Negative for vertigo, weakness, numbness and headaches.  All other systems reviewed and are negative.    Allergies  Penicillins and Sulfonamide derivatives  Home Medications  No current outpatient prescriptions on file. BP 134/63  Pulse 57  Temp(Src) 98.3 F (36.8 C) (Oral)  Resp 16  SpO2 100% Physical Exam  Nursing note and vitals reviewed. Constitutional: She is oriented to person, place, and time. She appears well-developed and well-nourished. No distress.  HENT:  Head: Normocephalic and atraumatic.  Right Ear: External ear normal.  Left Ear: External ear normal.  Mouth/Throat: Oropharynx is clear and moist. No oropharyngeal exudate.  Boggy nasal turbinates  Eyes: Conjunctivae are normal. Pupils are equal, round, and reactive to light. No scleral  icterus.  Neck: Normal range of motion. Neck supple. Muscular tenderness present. No spinous process tenderness present.    Cardiovascular: Normal rate, regular rhythm and normal heart sounds.  Exam reveals no gallop and no friction rub.   No murmur heard. Pulmonary/Chest: Effort normal and breath sounds normal. No respiratory distress. She has no wheezes.  She has no rales. She exhibits no tenderness.  Abdominal: Soft. Bowel sounds are normal. She exhibits no distension. There is no tenderness. There is no rebound and no guarding.  Musculoskeletal: Normal range of motion. She exhibits tenderness. She exhibits no edema.       Left shoulder: She exhibits tenderness and pain. She exhibits normal range of motion, no bony tenderness, no swelling and no deformity.       Arms: Lymphadenopathy:    She has no cervical adenopathy.  Neurological: She is alert and oriented to person, place, and time. She exhibits normal muscle tone. Coordination normal.  Skin: Skin is warm and dry. No rash noted. No erythema. No pallor.  Psychiatric: She has a normal mood and affect. Her behavior is normal. Judgment and thought content normal.    ED Course  Procedures (including critical care time) Labs Review Labs Reviewed  CBC WITH DIFFERENTIAL  POCT I-STAT, CHEM 8  POCT I-STAT TROPONIN I   Imaging Review Dg Chest 2 View  11/06/2013   CLINICAL DATA:  Left-sided chest pain  EXAM: CHEST  2 VIEW  COMPARISON:  11/25/2005  FINDINGS: The heart size and mediastinal contours are within normal limits. Both lungs are clear. The visualized skeletal structures are unremarkable.  IMPRESSION: No active cardiopulmonary disease.   Electronically Signed   By: Christiana Pellant M.D.   On: 11/06/2013 07:23   Dg Shoulder Left  11/06/2013   CLINICAL DATA:  Left posterior shoulder/ scapular pain  EXAM: LEFT SHOULDER - 2+ VIEW  COMPARISON:  None.  FINDINGS: There is no evidence of fracture or dislocation. There is no evidence of arthropathy or other focal bone abnormality. Soft tissues are unremarkable.  IMPRESSION: Negative.   Electronically Signed   By: Christiana Pellant M.D.   On: 11/06/2013 07:23    EKG Interpretation   None      Results for orders placed during the hospital encounter of 11/06/13  CBC WITH DIFFERENTIAL      Result Value Range   WBC 5.9  4.0 - 10.5 K/uL   RBC  4.03  3.87 - 5.11 MIL/uL   Hemoglobin 12.5  12.0 - 15.0 g/dL   HCT 96.0  45.4 - 09.8 %   MCV 92.3  78.0 - 100.0 fL   MCH 31.0  26.0 - 34.0 pg   MCHC 33.6  30.0 - 36.0 g/dL   RDW 11.9  14.7 - 82.9 %   Platelets 238  150 - 400 K/uL   Neutrophils Relative % 50  43 - 77 %   Neutro Abs 3.0  1.7 - 7.7 K/uL   Lymphocytes Relative 34  12 - 46 %   Lymphs Abs 2.0  0.7 - 4.0 K/uL   Monocytes Relative 12  3 - 12 %   Monocytes Absolute 0.7  0.1 - 1.0 K/uL   Eosinophils Relative 4  0 - 5 %   Eosinophils Absolute 0.2  0.0 - 0.7 K/uL   Basophils Relative 1  0 - 1 %   Basophils Absolute 0.0  0.0 - 0.1 K/uL  POCT I-STAT, CHEM 8      Result Value Range  Sodium 140  135 - 145 mEq/L   Potassium 4.7  3.5 - 5.1 mEq/L   Chloride 103  96 - 112 mEq/L   BUN 8  6 - 23 mg/dL   Creatinine, Ser 5.62  0.50 - 1.10 mg/dL   Glucose, Bld 96  70 - 99 mg/dL   Calcium, Ion 1.30  8.65 - 1.30 mmol/L   TCO2 32  0 - 100 mmol/L   Hemoglobin 13.6  12.0 - 15.0 g/dL   HCT 78.4  69.6 - 29.5 %  POCT I-STAT TROPONIN I      Result Value Range   Troponin i, poc 0.00  0.00 - 0.08 ng/mL   Comment 3            Dg Chest 2 View  11/06/2013   CLINICAL DATA:  Left-sided chest pain  EXAM: CHEST  2 VIEW  COMPARISON:  11/25/2005  FINDINGS: The heart size and mediastinal contours are within normal limits. Both lungs are clear. The visualized skeletal structures are unremarkable.  IMPRESSION: No active cardiopulmonary disease.   Electronically Signed   By: Christiana Pellant M.D.   On: 11/06/2013 07:23   Dg Shoulder Left  11/06/2013   CLINICAL DATA:  Left posterior shoulder/ scapular pain  EXAM: LEFT SHOULDER - 2+ VIEW  COMPARISON:  None.  FINDINGS: There is no evidence of fracture or dislocation. There is no evidence of arthropathy or other focal bone abnormality. Soft tissues are unremarkable.  IMPRESSION: Negative.   Electronically Signed   By: Christiana Pellant M.D.   On: 11/06/2013 07:23    Date: 11/06/2013  Rate: 67  Rhythm: sinus   QRS Axis: 56  Intervals: PR 167, QT 421, QTc 445  ST/T Wave abnormalities: none  Conduction Disutrbances:none  Narrative Interpretation: SR, left atrial enlargement  Old EKG Reviewed: No changes  8:43 AM Reports pain relieved with norco    MDM  Left shoulder strain URI  Patient is otherwise healthy 61 year old female with recent history of URI, cough, nasal congestion, with 2 day history of left shoulder pain - no cardiac history and no suspicion for CAD, EKG normal, negative troponin,    Scarlette Calico C. Marisue Humble, [place] 11/06/13 [phone]

## 2013-11-06 NOTE — ED Notes (Signed)
Pt denies chest pain, SOB, dizziness, lightheadedness, N/V.

## 2013-11-06 NOTE — ED Provider Notes (Signed)
Medical screening examination/treatment/procedure(s) were performed by non-physician practitioner and as supervising physician I was immediately available for consultation/collaboration.   Date: 11/06/2013  Rate: 67  Rhythm: normal sinus rhythm  QRS Axis: normal  Intervals: normal  ST/T Wave abnormalities: normal  Conduction Disutrbances:none  Narrative Interpretation:   Old EKG Reviewed: unchanged    Olivia Mackie, MD 11/06/13 2050

## 2013-11-06 NOTE — ED Notes (Signed)
Patient presents with c/o left shoulder pain.  States she must have a virus or something and hasn't been feeling good.  Left shoulder has been hurting denies injury

## 2014-01-22 ENCOUNTER — Encounter

## 2014-01-23 MED ORDER — ONETOUCH ULTRA BLUE VI STRP
Status: DC
Start: 2014-01-23 — End: 2014-10-15

## 2014-01-29 MED ORDER — EFLORNITHINE HCL 13.9 % EX CREA
13.9 % | CUTANEOUS | Status: AC
Start: 2014-01-29 — End: 2015-01-30

## 2014-01-29 MED ORDER — FLUOCINONIDE 0.05 % EX SOLN
0.05 % | CUTANEOUS | Status: AC
Start: 2014-01-29 — End: ?

## 2014-01-29 MED ORDER — NYSTATIN 100000 UNIT/GM EX CREA
100000 UNIT/GM | CUTANEOUS | Status: AC
Start: 2014-01-29 — End: ?

## 2014-01-29 NOTE — Patient Instructions (Signed)
Do you understand your plan of care today? Is there anything else I can help you with today? You may be getting a survey in the mail regarding your care today. If you do please take a few minutes to fill it out and mail it back to us. It would be greatly appreciated.      Follow up in 6 months with the dermatology department -appointment scheduled

## 2014-01-29 NOTE — Progress Notes (Signed)
The patient, Eileen Barnes, identity was verified by name and MRN. Supervising provider for clinic visit: Dr. Francesco Runnerichard Assaf.     Chaperone status: offered, declined    Chief Complaint   Patient presents with   ??? Skin Exam     6 month -pt c/o redness on abd and sores on scalp        Allergies and medications reviewed with pt  Pt measured within the past year-pt 5'7"    01/29/2014  9:55 AM  An After Visit Summary was printed and given to the patient.  I have reviewed the provider's instructions with the patient, answering all questions to her satisfaction.  Specimen labeled properly and placed in biohazard bag for transport to lab for processing.   6 month f.u appt scheduled per Dr. Gilda CreaseAssaf-pt accepted date and time of appt

## 2014-01-29 NOTE — Progress Notes (Signed)
63 y.o. female last seen in Dermatology Dept 06/2013 - skin screening    Hx SCC, BCC  Recurrent irritated spots in scalp - uses "wen" for hair curl, Scalpicin helps symptoms   Pt brings in tubes she and her husband share: Efudex (per Dr. Roger Shelterong, still unopened, expires 03/2014), mycolog II, Lidex cream    ROS: no other skin complaints   [facility]" couldn't afford the $300 weekly pool!)    OBJECTIVE: Pleasant, well-appearing female    BP 158/84    Pulse 58    Temp(Src) 97.9 ??F (36.6 ??C) (Oral)    Resp 16    Wt 230 lb (104.327 kg)     General skin exam of scalp, face, ears, neck, chest, abdomen, back, bilateral upper and lower extremities, buttocks, hands and feet shows:  - significant findings noted below     ASSESSMENT/PLAN:    1. Intertrigo  Note: moist and occasionally malodorous, around umbilicus (Neosporin clears it within 2-3 days)   - Wound Culture  - nystatin (MYCOSTATIN) 100000 UNIT/GM cream; Apply to skin fold areas for yeast infections, 3 times a day until clear  Dispense: 30 g; Refill: 5  Discussed avoiding steroids to thin skin areas, unless needed for itch. Pt with hx diabetes, aware of increased risk of yeast infections    2. Seborrheic dermatitis of scalp  Note: limited areas of erythema and excoriated papules by history   - fluocinonide (LIDEX) 0.05 % external solution; Apply to scalp itchy spots 2 times a day as needed - use a thin layer  Dispense: 60 mL; Refill: 0    3. Hx of squamous cell carcinoma of skin  Note: chest KA, clear on shave biopsy and persistent, stable scar-like papule - trial LN last visit  Plan: recommended she use her unopened tube of Efudex bid x 2 weeks to this area - usage and side effects discussed     4. AK (actinic keratosis)  Note: 3 mm scaly papule of R shoulder - she will apply fluorouracil 5% to this area as well    5. Hx of basal cell carcinoma  Note: R upper posterior shoulder and L leg, No worrisome lesions of skin areas examined  today        FOLLOW-UP:  TFU for culture; then 6 mos screen, sooner as needed for changing lesions

## 2014-01-29 NOTE — Progress Notes (Signed)
Pt called in, requests Rx Vaniqa (used in past) - printed, she will arrange pick-up in Dept

## 2014-01-29 NOTE — Addendum Note (Signed)
Addended by: Francesco RunnerASSAF, Keeyon Privitera on: 01/29/2014 11:53 AM     Modules accepted: Orders

## 2014-01-31 LAB — CULTURE, WOUND

## 2014-02-02 ENCOUNTER — Encounter

## 2014-02-02 MED ORDER — LOSARTAN POTASSIUM 50 MG PO TABS
50 MG | ORAL_TABLET | ORAL | Status: AC
Start: 2014-02-02 — End: ?

## 2014-02-02 NOTE — Telephone Encounter (Signed)
I called - pt states she knows when it flares, and Neosporin works well    Discussed using either OTC antibiotic or the Nystatin rx if not clearing (pt is diabetic, increased risk yeast infection)    Patient thankful for call, no further questions

## 2014-02-02 NOTE — Telephone Encounter (Signed)
I have not seen this patient since 04/2012.  Seen for preop at that time.  Will forward to PCP.     Hermenia BersMAHI Orenthal Debski, MD

## 2014-02-03 NOTE — Progress Notes (Signed)
Is interested in [facility]
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
## 2014-02-12 MED ORDER — METFORMIN HCL 1000 MG PO TABS
1000 MG | ORAL_TABLET | Freq: Two times a day (BID) | ORAL | Status: AC
Start: 2014-02-12 — End: ?

## 2014-02-12 MED ORDER — FLUTICASONE PROPIONATE 50 MCG/ACT NA SUSP
50 MCG/ACT | NASAL | Status: AC
Start: 2014-02-12 — End: ?

## 2014-02-12 NOTE — Progress Notes (Signed)
Subjective:      Patient ID: Eileen Barnes is a 63 y.o. female.    Diabetes  She presents for her follow-up diabetic visit. She has type 2 diabetes mellitus. Her disease course has been stable. There are no hypoglycemic associated symptoms. Pertinent negatives for hypoglycemia include no headaches or sweats. There are no diabetic associated symptoms. Pertinent negatives for diabetes include no blurred vision and no chest pain. There are no hypoglycemic complications. Symptoms are stable. There are no diabetic complications. Risk factors for coronary artery disease include dyslipidemia and obesity. Current diabetic treatment includes oral agent (monotherapy). She is compliant with treatment all of the time. Her weight is stable. She is following a generally unhealthy diet. When asked about meal planning, she reported none. Her breakfast blood glucose range is generally 130-140 mg/dl.   Hypertension  This is a chronic problem. The problem is unchanged. The problem is controlled. Pertinent negatives include no anxiety, blurred vision, chest pain, headaches, malaise/fatigue, neck pain, orthopnea, palpitations, peripheral edema, PND, shortness of breath or sweats. There are no associated agents to hypertension. Risk factors for coronary artery disease include diabetes mellitus and dyslipidemia.       Review of Systems   Constitutional: Negative.  Negative for malaise/fatigue.   Eyes: Negative for blurred vision.   Respiratory: Negative.  Negative for shortness of breath.    Cardiovascular: Negative.  Negative for chest pain, palpitations, orthopnea and PND.   Musculoskeletal: Negative for neck pain.   Neurological: Negative for headaches.       Objective:   Physical Exam  BP 138/72    Pulse 60    Temp(Src) 98 ??F (36.7 ??C) (Oral)    Resp 20    Ht 5\' 7"  (1.702 m)    Wt 234 lb (106.142 kg)    BMI 36.64 kg/m2     General appearance: alert, well appearing, and in no distress.  Chest: clear to auscultation, no wheezes, rales  or rhonchi, symmetric air entry.   CVS exam: normal rate, regular rhythm, normal S1, S2, no murmurs, rubs, clicks or gallops.  Exam of extremities: peripheral pulses normal, no pedal edema, no clubbing or cyanosis      Eileen Barnes was seen today for diabetes and medication refill.    Diagnoses and associated orders for this visit:    DM (diabetes mellitus), type 2  increase metformin to 1000 mg BID  - metFORMIN (GLUCOPHAGE) 1000 MG tablet; Take 1 tablet by mouth 2 times daily. with food for diabetes  - Microalbumin / Creatinine Urine Ratio; Future  - Hemoglobin A1C; Future  - Lipid Panel; Future  - Comprehensive Metabolic Panel; Future  - CBC Auto Differential; Future  - TSH WITH REFLEX TO FT4; Future    HTN (hypertension)  The current medical regimen is effective;  continue present plan and medications.    AR (allergic rhinitis)  - fluticasone (FLONASE) 50 MCG/ACT nasal spray; Use 2 sprays in each nostril daily    She requests refills of current medications unrelated to today's medical evaluation. These medications and related lab tests were reviewed with her and refilled.

## 2014-02-12 NOTE — Progress Notes (Signed)
The patient, Eileen Barnes, identity was verified by name and MRN. Supervising provider for clinic visit: Dr.Dardir.     Has the patient seen a provider outside of [facility] since their last visit? No    Chaperone status: accompanied by husband    Diabetic status:  diabetic; shoes and socks not removed, reason: declined "No problems"   diabetic retinal exam up to date (within 1 year for pt with known retinopathy, within 2 years for pt with no known retinopathy

## 2014-02-12 NOTE — Progress Notes (Signed)
The patient, Eileen Barnes, identity was verified by name and MRN. Supervising provider for clinic visit:   Dr.Dardir.  An After Visit Summary was printed and given to the patient.  I have reviewed the provider's instructions with the patient, answering all questions to her satisfaction.

## 2014-02-13 LAB — COMPREHENSIVE METABOLIC PANEL
ALT: 17 U/L (ref 0–33)
AST: 14 U/L (ref 0–35)
Albumin: 4.5 g/dL (ref 3.9–4.9)
Alkaline Phosphatase: 110 U/L (ref 40–130)
Anion Gap: 16 mEq/L — ABNORMAL HIGH (ref 7–13)
BUN: 15 mg/dL (ref 8–23)
CO2: 25 mEq/L (ref 22–29)
Calcium: 9.1 mg/dL (ref 8.6–10.2)
Chloride: 100 mEq/L (ref 98–107)
Creatinine: 0.43 mg/dL — ABNORMAL LOW (ref 0.50–0.90)
GFR African American: >60 (ref >60)
GFR Non-African American: >60 (ref >60)
Globulin: 2.2 g/dL — ABNORMAL LOW (ref 2.3–3.5)
Glucose: 166 mg/dL — ABNORMAL HIGH (ref 74–109)
Potassium: 4.1 mEq/L (ref 3.5–5.1)
Sodium: 141 mEq/L (ref 132–144)
Total Bilirubin: 0.4 mg/dL (ref 0.0–1.2)
Total Protein: 6.7 g/dL (ref 6.4–8.1)

## 2014-02-13 LAB — LIPID PANEL
Cholesterol, Total: 143 mg/dL (ref 0–199)
HDL: 56 mg/dL (ref 40–59)
LDL Calculated: 55 mg/dL (ref 0–129)
Triglycerides: 161 mg/dL (ref 0–200)

## 2014-02-13 LAB — CBC WITH AUTO DIFFERENTIAL
Basophils %: 0.3 % (ref 0.0–1.5)
Basophils Absolute: 0 10*3/uL (ref 0.0–0.2)
Eosinophils %: 4 % (ref 0.0–8.0)
Eosinophils Absolute: 0.4 10*3/uL (ref 0.0–0.7)
Hematocrit: 42.4 % (ref 36.0–48.0)
Hemoglobin: 14.1 g/dL (ref 12.0–16.0)
Lymphocytes %: 23.1 % (ref 21.8–41.6)
Lymphocytes Absolute: 2.5 10*3/uL (ref 0.7–3.3)
MCH: 30.9 pg (ref 26.0–34.0)
MCHC: 33.3 % (ref 32.0–36.0)
MCV: 92.7 fL (ref 80.0–100.0)
Monocytes %: 4.1 % — ABNORMAL LOW (ref 5.7–14.0)
Monocytes Absolute: 0.4 10*3/uL (ref 0.2–0.9)
Neutrophils %: 68.5 % (ref 49.2–70.4)
Neutrophils Absolute: 7.4 10*3/uL — ABNORMAL HIGH (ref 1.5–5.8)
Platelets: 179 10*3/uL (ref 150–450)
RBC: 4.58 M/uL (ref 4.00–5.30)
RDW: 13.3 % (ref 11.5–15.5)
WBC: 10.9 10*3/uL (ref 4.5–11.0)

## 2014-02-13 LAB — HEMOGLOBIN A1C: Hemoglobin A1C: 7.7 % — ABNORMAL HIGH (ref 4.8–5.9)

## 2014-02-13 LAB — TSH WITH REFLEX TO FT4: TSH Reflex FT4: 2.83 u[IU]/mL (ref 0.270–4.200)

## 2014-02-13 LAB — MICROALBUMIN / CREATININE URINE RATIO
Creatinine, Ur: 46.1 mg/dL
Microalbumin, Random Urine: <1.2 mg/dL

## 2014-03-03 MED ORDER — SERTRALINE HCL 100 MG PO TABS
100 MG | ORAL_TABLET | ORAL | Status: DC
Start: 2014-03-03 — End: 2014-08-04

## 2014-03-03 MED ORDER — TRAZODONE HCL 50 MG PO TABS
50 MG | ORAL_TABLET | ORAL | Status: DC
Start: 2014-03-03 — End: 2014-07-15

## 2014-03-03 NOTE — Progress Notes (Signed)
FOLLOW UP    Eileen Barnes, a 63 y.o. female, returns for a follow-up medication appointment.  Patient identify verified by name and MRN    Visit Time: Counseling Time 20 minutes    Those attending session: patient    CHIEF COMPLAINT: anxiety and depression    Past Family and Social History reviewed and updated as needed.     REVIEW OF SYMPTOMS: since last visit reports mood significantly improved; less irritable, anxious, depressed; sleep good with Trazodone 50-75mg  po qhs prn; Denies other vegetative symptoms.          Medication Assessment/Response: Continues Zoloft 200mg  po qd and Trazodone 50-100mg  po qhs prn sleep    Side Effects: slight decreased sexual performance    SESSION CONTENT: Pleased with progress although struggles with need to take meds for mood; reports sister has mood issues as well and acknowledges biologic and genetic component; had direct and assertive discussion with husband about issues and feels their communication has improved and has noted some effort on his part which is positive; he is agreeable to attend Couples Communication Group; looks forward to regular camping this summer    MENTAL STATUS EXAM:    Mental Status Exam: appearance:  appropriately dressed, appropriately groomed, behavior:  normal, attitude:  cooperative, speech:  appropriate, mood: calm, bright, easily verbal, personable, affect:  congruent with mood, within normal limits and appropriate, thought content:  no evidence of psychosis, thought process:  logical and coherent, orientation:  oriented in all spheres, memory:  recent:  good and remote:  good, insight:  good , judgment:  good  and cognitive:  intact    RISK ASSESSMENT:     Suicide screen: denies current suicidal ideation, plan and intent    Self Injurious Behavior: denies    Homicide screen: denies current homicidal ideation, plan and intent    History of Violence: denies    Access to Guns/Weapons: no    CLINICAL ASSESSMENT: Mood symptoms improved and coping  well overall with treatment and self intervention.    DIAGNOSIS:   1. Panic disorder with agoraphobia    2. Recurrent major depression (HCC)        TREATMENT PLAN                         Patient Goals: Continue to more effectively cope with mood symptoms and stress.      Interventions in Session:Gave positive feedback for efforts and progress.  Reinforced self care focus with rationale.  Pointed out cognitive distortions and unrealistic expectations that increase mood symptoms and stress as well as more positive, balanced, realistic options.  Reinforced boundaries of control and responsibility and role/responsibility in meeting self needs.  Reinforced focus an areas within control - self - versus hoping for change externally to meet self needs.  Reinforced realistic expectations of self, others and relationships.  Discussed resources and supports including group    Medication options, risks, and benefits discussed with patient and/or family guardian: yes    Informed Consent: Patient understands treatment        Safety Plan: Patient advised of emergency procedures/contacts:   patient and/or family agrees to to contact clinic or 24 hour resources as needed, appropriate 24 hour phone numbers given and patient contracts for safety of self and others      Assignments/Recommendations: Continue   Orders Placed This Encounter   Medications   ??? sertraline (ZOLOFT) 100 MG tablet     Sig:  TAKE 1 TABLET BY MOUTH ONE TIME DAILY     Dispense:  90 tablet     Refill:  1     Mail order   ??? traZODone (DESYREL) 50 MG tablet     Sig: TAKE FROM 1/2 TO 2 TABLETS BY MOUTH EVERY NIGHT AT BEDTIME AS NEEDED FOR SLEEP     Dispense:  180 tablet     Refill:  1     Next Steps: Schedule follow up with me for  in 6 months and Couples Communication Group with husband; call prn     Patient and/or family/guardian verbalizes understanding of and agreement with treatment recommendations and plan  yes  _____________________________________________________________________________________________________________________

## 2014-03-03 NOTE — Telephone Encounter (Signed)
Interested in Couples Communication Group

## 2014-03-05 NOTE — Telephone Encounter (Signed)
Noted.    C.Baker, LISW-S

## 2014-03-18 ENCOUNTER — Telehealth

## 2014-03-18 MED ORDER — ONETOUCH DELICA LANCETS FINE MISC
Status: AC
Start: 2014-03-18 — End: ?

## 2014-03-18 MED ORDER — GLUCOSE BLOOD VI STRP
Status: AC
Start: 2014-03-18 — End: 2015-03-18

## 2014-03-18 NOTE — Telephone Encounter (Signed)
In order to avoid waste, abuse, and potential fraud, testing is required in order to calculate a correct day supply for DM test strips and lancets.    Chart Reviewed:  Pt prescribed both oral anti-glycemic & insulin per RX records/chart review. no  Pt prescribed only oral anti-glycemic(s) per RX records/chart review. yes  Pt prescribed only insulin per RX records/chart review. no    Documentated testing frequency. 2 times daily.     PLAN:  Medications:  ?? Recogmmend to change to One Touch Ultra Test Strips-use to test blood sugar up to  2 times a daily, orders signed.  ?? Recogmmend to change to One Touch Delica Lancets-use to test blood sugar up to  2 times a daily, orders signed.      Rion Schnitzer M Talon Regala

## 2014-04-13 ENCOUNTER — Emergency Department (HOSPITAL_COMMUNITY)
Admission: EM | Admit: 2014-04-13 | Discharge: 2014-04-13 | Disposition: A | Discharge status: Home / Self Care | Payer: BC Managed Care – PPO | Attending: Emergency Medicine | Admitting: Emergency Medicine

## 2014-04-13 ENCOUNTER — Encounter (HOSPITAL_COMMUNITY): Payer: Self-pay | Admitting: Emergency Medicine

## 2014-04-13 DIAGNOSIS — Z880 Allergy status to penicillin: Secondary | ICD-10-CM | POA: Insufficient documentation

## 2014-04-13 DIAGNOSIS — Z862 Personal history of diseases of the blood and blood-forming organs and certain disorders involving the immune mechanism: Secondary | ICD-10-CM | POA: Insufficient documentation

## 2014-04-13 DIAGNOSIS — L259 Unspecified contact dermatitis, unspecified cause: Secondary | ICD-10-CM | POA: Insufficient documentation

## 2014-04-13 DIAGNOSIS — IMO0002 Reserved for concepts with insufficient information to code with codable children: Secondary | ICD-10-CM | POA: Insufficient documentation

## 2014-04-13 DIAGNOSIS — R21 Rash and other nonspecific skin eruption: Secondary | ICD-10-CM | POA: Insufficient documentation

## 2014-04-13 DIAGNOSIS — Z8639 Personal history of other endocrine, nutritional and metabolic disease: Secondary | ICD-10-CM | POA: Insufficient documentation

## 2014-04-13 DIAGNOSIS — Z79899 Other long term (current) drug therapy: Secondary | ICD-10-CM | POA: Insufficient documentation

## 2014-04-13 MED ORDER — PREDNISONE 20 MG PO TABS
ORAL_TABLET | ORAL | Status: DC
Start: 2014-04-13 — End: 2015-08-04

## 2014-04-13 MED ORDER — PREDNISONE 20 MG PO TABS
60.0000 mg | ORAL_TABLET | Freq: Once | ORAL | Status: AC
  Administered 2014-04-13: 60 mg via ORAL
  Filled 2014-04-13: qty 3

## 2014-04-13 NOTE — ED Notes (Signed)
Patient states she has rash x 2 weeks.  Patient states fine rash mostly to legs.   Patient states she has one spot on her side.   Patient has been using cortisone cream and benadryl to help with itch.

## 2014-04-13 NOTE — ED Notes (Signed)
Patient refused wheelchair. 

## 2014-04-13 NOTE — Discharge Instructions (Signed)
Take prednisone beginning tomorrow as you were given the first dose in the emergency department today. Continue hydrocortisone cream and Benadryl.  Contact Dermatitis Contact dermatitis is a reaction to certain substances that touch the skin. Contact dermatitis can be either irritant contact dermatitis or allergic contact dermatitis. Irritant contact dermatitis does not require previous exposure to the substance for a reaction to occur.Allergic contact dermatitis only occurs if you have been exposed to the substance before. Upon a repeat exposure, your body reacts to the substance.  CAUSES  Many substances can cause contact dermatitis. Irritant dermatitis is most commonly caused by repeated exposure to mildly irritating substances, such as:  Makeup.  Soaps.  Detergents.  Bleaches.  Acids.  Metal salts, such as nickel. Allergic contact dermatitis is most commonly caused by exposure to:  Poisonous plants.  Chemicals (deodorants, shampoos).  Jewelry.  Latex.  Neomycin in triple antibiotic cream.  Preservatives in products, including clothing. SYMPTOMS  The area of skin that is exposed may develop:  Dryness or flaking.  Redness.  Cracks.  Itching.  Pain or a burning sensation.  Blisters. With allergic contact dermatitis, there may also be swelling in areas such as the eyelids, mouth, or genitals.  DIAGNOSIS  Your caregiver can usually tell what the problem is by doing a physical exam. In cases where the cause is uncertain and an allergic contact dermatitis is suspected, a patch skin test may be performed to help determine the cause of your dermatitis. TREATMENT Treatment includes protecting the skin from further contact with the irritating substance by avoiding that substance if possible. Barrier creams, powders, and gloves may be helpful. Your caregiver may also recommend:  Steroid creams or ointments applied 2 times daily. For best results, soak the rash area in cool  water for 20 minutes. Then apply the medicine. Cover the area with a plastic wrap. You can store the steroid cream in the refrigerator for a "chilly" effect on your rash. That may decrease itching. Oral steroid medicines may be needed in more severe cases.  Antibiotics or antibacterial ointments if a skin infection is present.  Antihistamine lotion or an antihistamine taken by mouth to ease itching.  Lubricants to keep moisture in your skin.  Burow's solution to reduce redness and soreness or to dry a weeping rash. Mix one packet or tablet of solution in 2 cups cool water. Dip a clean washcloth in the mixture, wring it out a bit, and put it on the affected area. Leave the cloth in place for 30 minutes. Do this as often as possible throughout the day.  Taking several cornstarch or baking soda baths daily if the area is too large to cover with a washcloth. Harsh chemicals, such as alkalis or acids, can cause skin damage that is like a burn. You should flush your skin for 15 to 20 minutes with cold water after such an exposure. You should also seek immediate medical care after exposure. Bandages (dressings), antibiotics, and pain medicine may be needed for severely irritated skin.  HOME CARE INSTRUCTIONS  Avoid the substance that caused your reaction.  Keep the area of skin that is affected away from hot water, soap, sunlight, chemicals, acidic substances, or anything else that would irritate your skin.  Do not scratch the rash. Scratching may cause the rash to become infected.  You may take cool baths to help stop the itching.  Only take over-the-counter or prescription medicines as directed by your caregiver.  See your caregiver for follow-up care as  directed to make sure your skin is healing properly. SEEK MEDICAL CARE IF:   Your condition is not better after 3 days of treatment.  You seem to be getting worse.  You see signs of infection such as swelling, tenderness, redness, soreness,  or warmth in the affected area.  You have any problems related to your medicines. Document Released: 10/27/2000 Document Revised: 01/22/2012 Document Reviewed: 04/04/2011 [facility] Patient Information 2014 [place], [place].  Rash A rash is a change in the color or texture of your skin. There are many different types of rashes. You may have other problems that accompany your rash. CAUSES   Infections.  Allergic reactions. This can include allergies to pets or foods.  Certain medicines.  Exposure to certain chemicals, soaps, or cosmetics.  Heat.  Exposure to poisonous plants.  Tumors, both cancerous and noncancerous. SYMPTOMS   Redness.  Scaly skin.  Itchy skin.  Dry or cracked skin.  Bumps.  Blisters.  Pain. DIAGNOSIS  Your caregiver may do a physical exam to determine what type of rash you have. A skin sample (biopsy) may be taken and examined under a microscope. TREATMENT  Treatment depends on the type of rash you have. Your caregiver may prescribe certain medicines. For serious conditions, you may need to see a skin doctor (dermatologist). HOME CARE INSTRUCTIONS   Avoid the substance that caused your rash.  Do not scratch your rash. This can cause infection.  You may take cool baths to help stop itching.  Only take over-the-counter or prescription medicines as directed by your caregiver.  Keep all follow-up appointments as directed by your caregiver. SEEK IMMEDIATE MEDICAL CARE IF:  You have increasing pain, swelling, or redness.  You have a fever.  You have new or severe symptoms.  You have body aches, diarrhea, or vomiting.  Your rash is not better after 3 days. MAKE SURE YOU:  Understand these instructions.  Will watch your condition.  Will get help right away if you are not doing well or get worse. Document Released: 10/20/2002 Document Revised: 01/22/2012 Document Reviewed: 08/14/2011 [facility] Patient Information 2014 [place], [place].

## 2014-04-13 NOTE — ED Provider Notes (Signed)
Medical screening examination/treatment/procedure(s) were performed by non-physician practitioner and as supervising physician I was immediately available for consultation/collaboration.   EKG Interpretation None       Orlie Dakin, MD 04/13/14 1538

## 2014-04-13 NOTE — ED Provider Notes (Signed)
CSN: [id number]     Arrival date & time 04/13/14  0915 History  This chart was scribed for non-physician practitioner, Michele Mcalpine, PA-C working with Orlie Dakin, MD by Frederich Balding, ED scribe. This patient was seen in room TR06C/TR06C and the patient's care was started at 9:23 AM.   Chief Complaint  Patient presents with  . Rash   The history is provided by the patient. No language interpreter was used.   HPI Comments: Angelica Mendoza is a 63 y.o. female who presents to the Emergency Department complaining of worsening rash that started 2 weeks ago. Pt states it started on her left leg then spread up to her left hip and right leg. She has used hydrocortisone cream and benadryl with relief of itching. Denies new soaps, lotions, detergents or medications but states she is outside in the woods and bushes a lot. She is unsure if she got bit by anything while she was outside. Denies difficulty breathing, trouble swallowing.   Past Medical History  Diagnosis Date  . Hyperlipidemia 02/2002  . Thyroid disease 04/2002   Past Surgical History  Procedure Laterality Date  . Tonsillectomy  1950  . Pilonidal cystectomy  1970  . Laparoscopic ovarian cystectomy  1976  . Vaginal hysterectomy  1987    with bilateral oopherectomy  . Sigmoidoscopy  [place]  . Colonoscopy  04/2004    no findings, internal hemrroids--Dr. Fuller [facility])   Family History  Problem Relation Age of Onset  . Hypertension Mother   . Arthritis Mother   . Lymphoma Mother   . Cancer Father     colon  . Hypertension Sister   . Alcohol abuse Brother   . Hypertension Sister    History  Substance Use Topics  . Smoking status: Never Smoker   . Smokeless tobacco: Never Used  . Alcohol Use: No   OB History   Grav Para Term Preterm Abortions TAB SAB Ect Mult Living                 Review of Systems  HENT: Negative for trouble swallowing.   Skin: Positive for rash.  All other systems reviewed and  are negative.  Allergies  Penicillins and Sulfonamide derivatives  Home Medications   Prior to Admission medications   Medication Sig Start Date End Date Taking? Authorizing Provider  diphenhydrAMINE (BENADRYL) 2 % cream Apply 1 application topically 3 (three) times daily as needed for itching.   Yes Historical Provider, MD  diphenhydrAMINE (BENADRYL) 25 mg capsule Take 25 mg by mouth every 6 (six) hours as needed for itching or allergies.   Yes Historical Provider, MD  hydrocortisone cream 1 % Apply 1 application topically 2 (two) times daily as needed for itching.   Yes Historical Provider, MD  predniSONE (DELTASONE) 20 MG tablet 2 tabs po daily x 3 days 04/13/14   Illene Labrador, PA-C   BP 140/74  Pulse 76  Temp(Src) 98.1 F (36.7 C) (Oral)  Resp 18  SpO2 100%  Physical Exam  Nursing note and vitals reviewed. Constitutional: She is oriented to person, place, and time. She appears well-developed and well-nourished. No distress.  HENT:  Head: Normocephalic and atraumatic.  Mouth/Throat: Oropharynx is clear and moist.  Eyes: Conjunctivae and EOM are normal.  Neck: Normal range of motion. Neck supple.  Cardiovascular: Normal rate, regular rhythm and normal heart sounds.   Pulmonary/Chest: Effort normal and breath sounds normal. No respiratory distress.  Musculoskeletal: Normal  range of motion. She exhibits no edema.  Neurological: She is alert and oriented to person, place, and time. No sensory deficit.  Skin: Skin is warm and dry.  Scattered maculopapular rash on left leg greater than right leg. Left flank with small papule. No signs of secondary infection. Spares palms of hands, soles of feet and mucosa.   Psychiatric: She has a normal mood and affect. Her behavior is normal.    ED Course  Procedures (including critical care time)  DIAGNOSTIC STUDIES: Oxygen Saturation is 100% on RA, normal by my interpretation.    COORDINATION OF CARE: 9:27 AM-Discussed treatment plan  which includes prednisone and continuing benadryl and hydrocortisone cream with pt at bedside and pt agreed to plan.   Labs Review Labs Reviewed - No data to display  Imaging Review No results found.   EKG Interpretation None      MDM   Final diagnoses:  Rash  Contact dermatitis    Patient presenting with rash. She is well appearing and in no apparent distress. Afebrile, vital signs stable. No new exposures. No respiratory or airway compromise. She has been outside in the bushes and was recently. Rash spares palms of hands and soles of feet along with mucosa. Advised her to continue hydrocortisone cream and Benadryl. Will give short burst of prednisone. Followup with PCP. Stable for discharge. Return precautions given. Patient states understanding of treatment care plan and is agreeable.  I personally performed the services described in this documentation, which was scribed in my presence. The recorded information has been reviewed and is accurate.  Illene Labrador, PA-C 04/13/14 [phone]

## 2014-06-12 ENCOUNTER — Emergency Department: Payer: Self-pay | Admitting: Emergency Medicine

## 2014-07-06 NOTE — Progress Notes (Signed)
The patient, Eileen Barnes, identity was verified by name and MRN.     SUBJECTIVE:  Eileen Barnes is a 63 y.o. 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 Patient is postmenopausal.  Abnormal Bleeding/Menses: none  Abnormal pap smear: ast 2 paps normal and last 2 HPV negative      CRC SCREENING:     Colonoscopy 2013    History   Sexual Activity   ??? Sexual Activity: Not on file     OB History    Gravida Para Term Preterm AB TAB SAB Ectopic Multiple Living    3  2  1     1           Because violence is so common, we ask all our patients: are you in a relationship or do you live with a person who threatens, hurts, or controls you: No    I have reviewed the following patient level data: current medications specialty specific, past surgical history, past medical history, social history, family history and obstetrical history  OBJECTIVE:    BREAST: no masses noted, no significant tenderness, no palpable axillary  and nodes, no skin changes    ABDOMEN: nontender and no masses, no organomegaly    PELVIC EXAM:  Exam Chaperoned by nurse No  VULVA: normal appearing vulva with no masses, tenderness, or lesions  VAGINA: normal appearing vagina with normal color and discharge, no lesions  CERVIX: normal appearing cervix without discharge or lesions  UTERUS: uterus is normal size, shape, consistency and nontender  ADNEXA: normal adnexa in size, nontender and no masses    PLAN:  Colorectal cancer screening up to date, PAP done, advised regarding frequency and Mamm ordered   Discussed daily intake of 1200 mg calcium and 1000 IU vitamin D3  and weight bearing exercise for osteoporosis prevention

## 2014-07-06 NOTE — Progress Notes (Deleted)
Subjective:      Patient ID: Eileen Barnes is a 63 y.o. female.    HPI    Review of Systems    Objective:   Physical Exam    Assessment:      ***      Plan:      ***

## 2014-07-06 NOTE — Progress Notes (Signed)
The patient, Eileen Barnes, identity was verified by name and MRN.  Chief Complaint   Patient presents with   ??? Gynecologic Exam     No LMP recorded. Patient is postmenopausal.  OB History    Gravida Para Term Preterm AB TAB SAB Ectopic Multiple Living    3  2  1     1         Family History   Problem Relation Age of Onset   ??? Other Maternal Grandmother    ??? Diabetes Father      Deceased With Complications, Dialysis patient   ??? Asthma Son    ??? Other Mother      3517 Years of Alzheimer's Disease and Living  with HTN, Thyroid problems   ??? Ovarian Cancer None    ??? Other Brother      Overweight with HX of DVT   ??? Diabetes Maternal Grandmother    ??? Hypothyroidism Paternal Aunt      Hx of Grave's Disease   ??? Alzheimer's Disease Maternal Uncle    ??? Colon Cancer Maternal Aunt      x4   ??? Breast Cancer Maternal Aunt    ??? Other Maternal Uncle    ??? Uterine Cancer None    ??? Other Paternal Aunt      Neuropathy unrelated to Diabetes   ??? Other Brother        Medication list reviewed and active medications noted. Patient is taking medications as directed.  See documentation in medication activity.  Allergies: allergy list reviewed, no new allergies added  Chaperone: offered, declined  Luan PullingKristina M Haskell Rihn  An After Visit Summary was printed and given to the patient.  Member discharged amb without complaint.

## 2014-07-07 NOTE — Patient Instructions (Signed)
Diabetic foot care instructions    Rx DM extra depth shoes and dm orthotics from [employer] form was sent to our DME dept. When approved from the  DME dept , you will be contacted to schedule the fitting.      Address and phone number  dispensed for [facility]:  [facility] Location:   [address]  [place], [place] [address]  [phone]  FAX: [phone]      F/u as needed or one yr

## 2014-07-07 NOTE — Progress Notes (Signed)
The patient, [name], identity was verified by mr. Supervising provider for clinic visit: Dr. Rosanne Gutting.     Chaperone offered: offered, declined  Chaperone was: offered, declined    Chief Complaint   Patient presents with   ??? Foot Problem     annual DM foot check         AVS distributed and discussed. Patient verbalizes understanding and has no additional questions.

## 2014-07-07 NOTE — Progress Notes (Signed)
Podiatric Office Visit:  The patient, Eileen Barnes, identity was verified by name and MRN.  Chief Complaint   Patient presents with   ??? Foot Problem     annual DM foot check     SUBJECTIVE:  Chief Complaint: Eileen Barnes  is a 63 year old DM female with peripheral neuropathy presents for diabetic foot check. Patient reports that she has no pain in the feet. However she does report numbness and states that she feels unstable when she is not wearing her diabetic shoes. The patient has no other pedal complaints at this time. Patient reports that she is no longer taking glyburide, insulin, or any gout medication since, her gastric band surgery.    Tucker, Henry L. (M.D.), MEDICAL DOCTOR:  Referring Physician:    PMH, Allergies and Meds reviewed in Epic chart    Lab Results   Component Value Date    LABA1C 7.7* 02/12/2014     No results found for this basename: eAG       Review of Systems -  Patient denies fever, chills, nausea, vomiting, diarrhea, calf pain, shortness of breath and chest pain.    Physical Exam: Patient is alert and oriented times three.     Vascular:   palpable Dorsalis Pedis and Posterior Tibial Pulses B/L  Capillary Fill time < 3 seconds to digits 1-5 B/L  skin temperature warm to warm tibial tuberosity to the digits B/L  + edema dorsal right midfoot  NO cellulitis/lymphangitis present, no Homans or calf pain    Neurological:   diminished light touch/epicritic sensation B/L  absent vibratory and sharp/dull sensations  absent protective sensation to plantar foot, b/l.    Dermatological:   Skin appears supple with mild xerosis at the ball of the left foot. good color, texture, turgor. No open lesions present. No callosities present. Webspaces clean and dry 1-4 b/l. Nails 1-5 b/l appear normal.      Musculoskeletal/Orthopaedic:   Structurally within normal limits  Toes 2-3 right are mildly divergent  No pain to palpation over the 3rd metatarsal dorsal right  +5/5 muscle strength Dorsiflexion,  Plantarflexion, Inversion, Eversion B/L  ROM of the 1st MTPJ is full b/l.  ROM of the MTJ/STJ is full without pain or crepitus b/l.  Ankle joint ROM is full  B/L  Rectus arch      ASSESSMENT  Diagnosis   1. 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 REFERRAL DME EQUIPMENT OTHER.         Tx//    Office Visit and exam  Patient education about prevention of subungual hematoma's and education about daily foot inspections.  Rx. Diabetic shoes-dme sent  Patient acknowledges understanding of above information.  F/u PRN     Michael Coyer DPM, PGY-3    The resident/student was under my direct supervision during today's patient encounter. I am in agreement with the medical chart documentation, as this is a reflection of my personal examination, assessment and treatment plan.    Eileen Barnes, DPM

## 2014-07-07 NOTE — Progress Notes (Signed)
Podiatric Office Visit:  The patient, Eileen Barnes, identity was verified by name and MRN.  Chief Complaint   Patient presents with   ??? Foot Problem     annual DM foot check     SUBJECTIVE:  Chief Complaint: Eileen Barnes  is a 63 year old DM female with peripheral neuropathy presents for diabetic foot check. Patient reports that she has no pain in the feet. However she does report numbness and states that she feels unstable when she is not wearing her diabetic shoes. The patient has no other pedal complaints at this time. Patient reports that she is no longer taking glyburide, insulin, or any gout medication since, her gastric band surgery.    [name] (M.D.), MEDICAL DOCTOR:  Referring Physician:    PMH, Allergies and Meds reviewed in Epic chart    Lab Results   Component Value Date    LABA1C 7.7* 02/12/2014     No results found for this basename: eAG       Review of Systems -  Patient denies fever, chills, nausea, vomiting, diarrhea, calf pain, shortness of breath and chest pain.    Physical Exam: Patient is alert and oriented times three.     Vascular:   palpable Dorsalis Pedis and Posterior Tibial Pulses B/L  Capillary Fill time < 3 seconds to digits 1-5 B/L  skin temperature warm to warm tibial tuberosity to the digits B/L  + edema dorsal right midfoot  NO cellulitis/lymphangitis present, no Homans or calf pain    Neurological:   diminished light touch/epicritic sensation B/L  absent vibratory and sharp/dull sensations  absent protective sensation to plantar foot, b/l.    Dermatological:   Skin appears supple with mild xerosis at the ball of the left foot. good color, texture, turgor. No open lesions present. No callosities present. Webspaces clean and dry 1-4 b/l. Nails 1-5 b/l appear normal.      Musculoskeletal/Orthopaedic:   Structurally within normal limits  Toes 2-3 right are mildly divergent  No pain to palpation over the 3rd metatarsal dorsal right  +5/5 muscle strength Dorsiflexion,  Plantarflexion, Inversion, Eversion B/L  ROM of the 1st MTPJ is full b/l.  ROM of the MTJ/STJ is full without pain or crepitus b/l.  Ankle joint ROM is full  B/L  Rectus arch      ASSESSMENT  Diagnosis   1. 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 REFERRAL DME EQUIPMENT OTHER.         Tx//    Office Visit and exam  Patient education about prevention of subungual hematoma's and education about daily foot inspections.  Rx. Diabetic shoes-dme sent  Patient acknowledges understanding of above information.  F/u PRN     Fransisco Hertz DPM, PGY-3    The resident/student was under my direct supervision during today's patient encounter. I am in agreement with the medical chart documentation, as this is a reflection of my personal examination, assessment and treatment plan.    Sharren Bridge Matalavage, DPM

## 2014-07-10 LAB — HUMAN PAPILLOMAVIRUS (HPV) DNA PROBE CERVICA*: HPV DNA High Risk: NEGATIVE

## 2014-07-15 ENCOUNTER — Encounter

## 2014-07-15 MED ORDER — TRAZODONE HCL 50 MG PO TABS
50 MG | ORAL_TABLET | ORAL | Status: DC
Start: 2014-07-15 — End: 2014-08-04

## 2014-07-15 MED ORDER — ATENOLOL 50 MG PO TABS
50 MG | ORAL_TABLET | ORAL | Status: DC
Start: 2014-07-15 — End: 2014-09-16

## 2014-07-15 NOTE — Telephone Encounter (Signed)
Last seen by Lenda Kelp, CNS on 03/03/14. Order pended to Lenda Kelp, CNS  Has follow up appointment 9/22 15  3:30 at [facility]

## 2014-07-15 NOTE — Telephone Encounter (Signed)
Pt is requesting refill on Trazadone 50mg  1& 1/2 t to 2t HS, at [facility], last seen 03-03-14

## 2014-08-04 MED ORDER — TRAZODONE HCL 50 MG PO TABS
50 MG | ORAL_TABLET | ORAL | Status: AC
Start: 2014-08-04 — End: 2015-08-04

## 2014-08-04 MED ORDER — SERTRALINE HCL 100 MG PO TABS
100 MG | ORAL_TABLET | ORAL | Status: AC
Start: 2014-08-04 — End: 2015-08-04
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
## 2014-08-17 ENCOUNTER — Ambulatory Visit
Admit: 2014-08-17 | Discharge: 2014-09-04 | Payer: PRIVATE HEALTH INSURANCE | Attending: Dermatology | Primary: Family Medicine

## 2014-08-17 DIAGNOSIS — Z1283 Encounter for screening for malignant neoplasm of skin: Secondary | ICD-10-CM

## 2014-08-17 NOTE — Progress Notes (Signed)
The patient, Eileen Barnes, identity was verified by name and MRN.       Chief Complaint   Patient presents with   ??? Skin Exam     6 month--SCREENING           Please see attached skin screen examination form.    Chaperone offered: Yes.  Chaperone was: offered, declined.      08/17/14  3:02 PM  An After Visit Summary was printed and given to the patient.  I have reviewed the provider's instructions with the patient, answering all questions to her satisfaction.  6 month f.u appt scheduled per Dr. Gilda CreaseAssaf-pt accepted date and time of appt

## 2014-08-17 NOTE — Patient Instructions (Signed)
After care instruction post Canthacur/Catharidin application:         1. If there is any irritation, burning or discomfort in the area treated sooner than the time instructed to wash off the blister, the area should be washed with soap and water immediately.     2. 24-48 hours after treatment: Blisters have usually formed. Try not to manipulate the blister, if necessary, cover the area with a bandage. The blister should heal in approximately one week.     3. 3-4 days after treatment: The crusted blister should fall off leaving superficial abrasions.     4. If you are instructed to use any topical medication at home please wait 3-4 days after the initial in-office treatment before applying the topical medication.     5. The area will likely need to be treated more than once. Please schedule a follow-up visit for 3-4 weeks.     **You may use Tylenol if there is mild/moderate pain** If you have any questions or concerns regarding your treatment please call one of our offices, thank you.           Do you understand your plan of care today? Is there anything else I can help you with today? You may be getting a survey in the mail regarding your care today. If you do please take a few minutes to fill it out and mail it back to us. It would be greatly appreciated.        Follow up in 6 months with the dermatology department

## 2014-08-17 NOTE — Progress Notes (Signed)
63 y.o. 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   General skin exam of scalp, face, ears, neck, chest, abdomen, back, bilateral upper and lower extremities, buttocks, hands shows:  - significant findings noted below     ASSESSMENT/PLAN:    1. Screening for skin cancer  No worrisome lesions of skin areas examined (nb: procedure only charge)    2. Verruca vulgaris  Note: symptomatic verrucous papule under nail plate  - PR DESTRUCTION BENIGN LESIONS UP TO 14    Canthacur applied to visible lesion today, wipe off with provided alcohol swab in 2-3 hours       FOLLOW-UP:  6 mos appt made, or sooner as needed for changing lesions

## 2014-08-18 ENCOUNTER — Ambulatory Visit
Admit: 2014-08-18 | Discharge: 2014-08-28 | Payer: PRIVATE HEALTH INSURANCE | Attending: Foot Surgery | Primary: Family Medicine

## 2014-08-18 ENCOUNTER — Ambulatory Visit: Admit: 2014-08-18 | Discharge: 2014-08-27 | Payer: PRIVATE HEALTH INSURANCE | Primary: Family Medicine

## 2014-08-18 DIAGNOSIS — M79675 Pain in left toe(s): Secondary | ICD-10-CM

## 2014-08-18 DIAGNOSIS — S90122A Contusion of left lesser toe(s) without damage to nail, initial encounter: Secondary | ICD-10-CM

## 2014-08-18 NOTE — Patient Instructions (Signed)
We want to know that ALL of your needs were met today and that we hopefully have exceeded your expectations. You may receive a survey about your visit today in the mail. Please let us know if we're doing a good job and how we can improve our service to you. We encourage you to contact us at [phone] if any of your needs were not met at this appointment.

## 2014-08-18 NOTE — Progress Notes (Signed)
The patient, Eileen Barnes, identity was verified by name and MRN.  Chief Complaint   Patient presents with   ??? Toe Injury       c/o fall yesterday and ? injury to left 3rd and 4th toes.  toes red/swollen    -  dm       SUBJECTIVE:  Eileen Barnes is a 63 y.o. female who presents with concerns of left 4th toe pain and redness.  She first noticed symptoms this morning and believes it may be related to yesterday's incident when she fell of an exam room table.  Denies any immediate pain after the incident but admits to having a considerable amount of neuropathy in her feet.  States she woke up this morning and felt a slight ache and soreness in her left foot and noticed the redness.  She has no other complaints at this time.  Blood glucose this morning was 168 mg/dl.    ROS: Patient denies fever, chills, nausea, vomiting, diarrhea, calf pain, thigh pain, shortness of breath and chest pain.      Patient Active Problem List   Diagnosis   ??? HTN (Hypertension)   ??? DM type 2 with diabetic peripheral neuropathy (HCC)   ??? SNHL (Sensorineural Hearing Loss)   ??? Gout   ??? Nuclear senile cataract of both eyes   ??? Severe obesity (BMI >= 40) (HCC)   ??? Hx of basal cell carcinoma   ??? DM (diabetes mellitus), type 2   ??? Panic disorder with agoraphobia   ??? History of colonic polyps   ??? Internal hemorrhoid   ??? GERD (gastroesophageal reflux disease)   ??? Hx of gastric bypass   ??? Major depressive disorder, recurrent episode, moderate (HCC)       Past Surgical History   Procedure Laterality Date   ??? Lasik     ??? Other surgical history       : appendectomy    ??? Other surgical history       : Tonsillectomy    ??? Other surgical history       : D&C    ??? Laparoscopic sleeve gastrectomy       10/14/2012        Family History   Problem Relation Age of Onset   ??? Other Maternal Grandmother    ??? Diabetes Father      Deceased With Complications, Dialysis patient   ??? Asthma Son    ??? Other Mother      41 Years of Alzheimer's Disease and Living  with HTN,  Thyroid problems   ??? Ovarian Cancer None    ??? Other Brother      Overweight with HX of DVT   ??? Diabetes Maternal Grandmother    ??? Hypothyroidism Paternal Aunt      Hx of Grave's Disease   ??? Alzheimer's Disease Maternal Uncle    ??? Colon Cancer Maternal Aunt      x4   ??? Breast Cancer Maternal Aunt    ??? Other Maternal Uncle    ??? Uterine Cancer None    ??? Other Paternal Aunt      Neuropathy unrelated to Diabetes   ??? Other Brother        History     Social History   ??? Marital Status: Married     Spouse Name: N/A     Number of Children: N/A   ??? Years of Education: N/A     Social History Main  Topics   ??? Smoking status: Former Smoker   ??? Smokeless tobacco: None      Comment: Quit smoking: 11/13/1993   ??? Alcohol Use: Yes   ??? Drug Use: No   ??? Sexual Activity: None     Other Topics Concern   ??? None     Social History Narrative       Current Outpatient Prescriptions on File Prior to Visit   Medication Sig Dispense Refill   ??? sertraline (ZOLOFT) 100 MG tablet TAKE 1 TABLET BY MOUTH ONE TIME DAILY 90 tablet 1   ??? traZODone (DESYREL) 50 MG tablet TAKE FROM 1/2 TO 2 TABLETS BY MOUTH EVERY NIGHT AT BEDTIME AS NEEDED FOR SLEEP 180 tablet 1   ??? atenolol (TENORMIN) 50 MG tablet TAKE ONE TABLET BY MOUTH TWICE A DAY 180 tablet 0   ??? ONETOUCH DELICA LANCETS FINE MISC Use to test blood sugar up to 2 times daily 100 each 5   ??? glucose blood VI test strips (ONE TOUCH ULTRA TEST) strip Use to test blood sugar up to 2 times daily 100 each 3   ??? metFORMIN (GLUCOPHAGE) 1000 MG tablet Take 1 tablet by mouth 2 times daily. with food for diabetes 180 tablet 3   ??? fluticasone (FLONASE) 50 MCG/ACT nasal spray Use 2 sprays in each nostril daily 32 g 5   ??? losartan (COZAAR) 50 MG tablet TAKE 1 TABLET BY MOUTH ONE TIME DAILY 90 tablet 3   ??? nystatin (MYCOSTATIN) 100000 UNIT/GM cream Apply to skin fold areas for yeast infections, 3 times a day until clear 30 g 5   ??? fluocinonide (LIDEX) 0.05 % external solution Apply to scalp itchy spots 2 times a day as  needed - use a thin layer 60 mL 0   ??? Eflornithine HCl (VANIQA) 13.9 % CREA Twice daily, to slow down hair growth 1 Tube 11   ??? ONE TOUCH ULTRA TEST strip USE AS DIRECTED FOR DIABETES 100 each 5   ??? aspirin 81 MG EC tablet Take 81 mg by mouth daily.     ??? Cholecalciferol (VITAMIN D) 2000 UNITS CAPS capsule Take 2 capsules by mouth.     ??? losartan (COZAAR) 25 MG tablet TAKE 3 TABLETS BY MOUTH ONE TIME DAILY 270 3   ??? atorvastatin (LIPITOR) 10 MG tablet TAKE 1 TABLET BY MOUTH ONE TIME DAILY 90 3   ??? Glucose Blood (BLOOD GLUCOSE TEST STRIPS) STRP USE AS DIRECTED FOR DIABETES 100 5   ??? albuterol (PROVENTIL) (2.5 MG/3ML) 0.083% nebulizer solution USE 1 VIAL VIA NEBULIZER AS DIRECTED 1 0   ??? Insulin Syringe-Needle U-100 31G X 5/16" 0.5 ML MISC USE AS DIRECTED FOR DIABETES 100 5   ??? Glucose Blood (BLOOD GLUCOSE TEST STRIPS) STRP USE AS DIRECTED BY PHYSICIAN FOR DIABETES 200 6   ??? Cholecalciferol 2000 UNITS TABS Take 1-1-1/2 tabs po qd 45 3     No current facility-administered medications on file prior to visit.       Allergies   Allergen Reactions   ??? Hydrocodone-Acetaminophen      Vomiting   ??? Kiwi Extract      Pt. States throat closes up   ??? Lisinopril      Cough   ??? Lovastatin    ??? Morphine And Related Nausea And Vomiting   ??? Nortriptyline      Severe constipation   ??? Pentazocine Nausea And Vomiting   ??? Tramadol Nausea And Vomiting  OBJECTIVE:  Patient is alert and oriented. No apparent distress.    Vascular:   palpable Dorsalis Pedis and Posterior Tibial Pulses B/L  Capillary Fill time < 3 seconds to digits 1-5 B/L  skin temperature warm to warm tibial tuberosity to the digits B/L.  Increase warmth to left 4th toe  Mild nonpitting edema b/l dorsal feet  NO cellulitis/lymphangitis present, no Homans or calf pain    Neurological:   diminished light touch/epicritic sensation B/L  absent vibratory and sharp/dull sensations  absent protective sensation to plantar foot, b/l.    Dermatological:   Skin appears supple with  mild xerosis at the ball of the left foot. good color, texture, turgor. No open lesions present. No callosities present. Webspaces clean and dry 1-4 b/l. Nails 1-5 b/l appear normal.    Ecchymosis noted to entire dorsum of left 4th toe and some noted to dorsal left 3rd toe    Musculoskeletal/Orthopaedic:   Structurally within normal limits  No gross deformity of left 3rd or 4th toes  Very minimal soreness with palpation of left 4th toe  +5/5 muscle strength Dorsiflexion, Plantarflexion, Inversion, Eversion B/L  ROM of the 1st MTPJ is full b/l.  ROM of the MTJ/STJ is full without pain or crepitus b/l.  Ankle joint ROM is full  B/L  Rectus arch    Xray Left foot: No fractures or dislocations noted.  Joint spaces well maintained.    Lower extremities reveal no signs of deep venous thrombosis; calves and thighs are soft without swelling, induration or tenderness, Homan's sign is negative.    ASSESSMENT:  Contusion, left 3rd and 4th toes    PLAN:  Exam and evaluation.  Xrays left foot reviewed with patient  Follow up as needed  The patient indicates understanding of these issues and agrees with the plan.    Patient was advised to call the office, emergency advice line or page the resident on-call immediately, should they experience any problems, changes or have any questions. Alternatively, the patient was advised to go to the ER.    Velicia Dejager Loran SentersJ Atley Neubert DPM PGY-2     Statement of Resident/Student Supervision:  The resident/student was under my direct supervision during today's patient encounter. I am in agreement with the medical chart documentation, as this is a reflection of my personal examination, assessment and treatment plan.    Rosanne GuttingAnthony Matalavage, D.P.M.

## 2014-08-18 NOTE — Progress Notes (Signed)
The patient, Eileen Barnes, identity was verified by name and MRN.  Chief Complaint   Patient presents with   ??? Toe Injury       c/o fall yesterday and ? injury to left 3rd and 4th toes.  toes red/swollen    -  dm       SUBJECTIVE:  Eileen Barnes is a 63 y.o. female who presents with concerns of left 4th toe pain and redness.  She first noticed symptoms this morning and believes it may be related to yesterday's incident when she fell of an exam room table.  Denies any immediate pain after the incident but admits to having a considerable amount of neuropathy in her feet.  States she woke up this morning and felt a slight ache and soreness in her left foot and noticed the redness.  She has no other complaints at this time.  Blood glucose this morning was 168 mg/dl.    ROS: Patient denies fever, chills, nausea, vomiting, diarrhea, calf pain, thigh pain, shortness of breath and chest pain.      Patient Active Problem List   Diagnosis   ??? HTN (Hypertension)   ??? DM type 2 with diabetic peripheral neuropathy (HCC)   ??? SNHL (Sensorineural Hearing Loss)   ??? Gout   ??? Nuclear senile cataract of both eyes   ??? Severe obesity (BMI >= 40) (HCC)   ??? Hx of basal cell carcinoma   ??? DM (diabetes mellitus), type 2   ??? Panic disorder with agoraphobia   ??? History of colonic polyps   ??? Internal hemorrhoid   ??? GERD (gastroesophageal reflux disease)   ??? Hx of gastric bypass   ??? Major depressive disorder, recurrent episode, moderate (HCC)       Past Surgical History   Procedure Laterality Date   ??? Lasik     ??? Other surgical history       : appendectomy    ??? Other surgical history       : Tonsillectomy    ??? Other surgical history       : D&C    ??? Laparoscopic sleeve gastrectomy       10/14/2012        Family History   Problem Relation Age of Onset   ??? Other Maternal Grandmother    ??? Diabetes Father      Deceased With Complications, Dialysis patient   ??? Asthma Son    ??? Other Mother      17 Years of Alzheimer's Disease and Living  with HTN,  Thyroid problems   ??? Ovarian Cancer None    ??? Other Brother      Overweight with HX of DVT   ??? Diabetes Maternal Grandmother    ??? Hypothyroidism Paternal Aunt      Hx of Grave's Disease   ??? Alzheimer's Disease Maternal Uncle    ??? Colon Cancer Maternal Aunt      x4   ??? Breast Cancer Maternal Aunt    ??? Other Maternal Uncle    ??? Uterine Cancer None    ??? Other Paternal Aunt      Neuropathy unrelated to Diabetes   ??? Other Brother        History     Social History   ??? Marital Status: Married     Spouse Name: N/A     Number of Children: N/A   ??? Years of Education: N/A     Social History Main   Topics   ??? Smoking status: Former Smoker   ??? Smokeless tobacco: None      Comment: Quit smoking: 11/13/1993   ??? Alcohol Use: Yes   ??? Drug Use: No   ??? Sexual Activity: None     Other Topics Concern   ??? None     Social History Narrative       Current Outpatient Prescriptions on File Prior to Visit   Medication Sig Dispense Refill   ??? sertraline (ZOLOFT) 100 MG tablet TAKE 1 TABLET BY MOUTH ONE TIME DAILY 90 tablet 1   ??? traZODone (DESYREL) 50 MG tablet TAKE FROM 1/2 TO 2 TABLETS BY MOUTH EVERY NIGHT AT BEDTIME AS NEEDED FOR SLEEP 180 tablet 1   ??? atenolol (TENORMIN) 50 MG tablet TAKE ONE TABLET BY MOUTH TWICE A DAY 180 tablet 0   ??? ONETOUCH DELICA LANCETS FINE MISC Use to test blood sugar up to 2 times daily 100 each 5   ??? glucose blood VI test strips (ONE TOUCH ULTRA TEST) strip Use to test blood sugar up to 2 times daily 100 each 3   ??? metFORMIN (GLUCOPHAGE) 1000 MG tablet Take 1 tablet by mouth 2 times daily. with food for diabetes 180 tablet 3   ??? fluticasone (FLONASE) 50 MCG/ACT nasal spray Use 2 sprays in each nostril daily 32 g 5   ??? losartan (COZAAR) 50 MG tablet TAKE 1 TABLET BY MOUTH ONE TIME DAILY 90 tablet 3   ??? nystatin (MYCOSTATIN) 100000 UNIT/GM cream Apply to skin fold areas for yeast infections, 3 times a day until clear 30 g 5   ??? fluocinonide (LIDEX) 0.05 % external solution Apply to scalp itchy spots 2 times a day as  needed - use a thin layer 60 mL 0   ??? Eflornithine HCl (VANIQA) 13.9 % CREA Twice daily, to slow down hair growth 1 Tube 11   ??? ONE TOUCH ULTRA TEST strip USE AS DIRECTED FOR DIABETES 100 each 5   ??? aspirin 81 MG EC tablet Take 81 mg by mouth daily.     ??? Cholecalciferol (VITAMIN D) 2000 UNITS CAPS capsule Take 2 capsules by mouth.     ??? losartan (COZAAR) 25 MG tablet TAKE 3 TABLETS BY MOUTH ONE TIME DAILY 270 3   ??? atorvastatin (LIPITOR) 10 MG tablet TAKE 1 TABLET BY MOUTH ONE TIME DAILY 90 3   ??? Glucose Blood (BLOOD GLUCOSE TEST STRIPS) STRP USE AS DIRECTED FOR DIABETES 100 5   ??? albuterol (PROVENTIL) (2.5 MG/3ML) 0.083% nebulizer solution USE 1 VIAL VIA NEBULIZER AS DIRECTED 1 0   ??? Insulin Syringe-Needle U-100 31G X 5/16" 0.5 ML MISC USE AS DIRECTED FOR DIABETES 100 5   ??? Glucose Blood (BLOOD GLUCOSE TEST STRIPS) STRP USE AS DIRECTED BY PHYSICIAN FOR DIABETES 200 6   ??? Cholecalciferol 2000 UNITS TABS Take 1-1-1/2 tabs po qd 45 3     No current facility-administered medications on file prior to visit.       Allergies   Allergen Reactions   ??? Hydrocodone-Acetaminophen      Vomiting   ??? Kiwi Extract      Pt. States throat closes up   ??? Lisinopril      Cough   ??? Lovastatin    ??? Morphine And Related Nausea And Vomiting   ??? Nortriptyline      Severe constipation   ??? Pentazocine Nausea And Vomiting   ??? Tramadol Nausea And Vomiting         OBJECTIVE:  Patient is alert and oriented. No apparent distress.    Vascular:   palpable Dorsalis Pedis and Posterior Tibial Pulses B/L  Capillary Fill time < 3 seconds to digits 1-5 B/L  skin temperature warm to warm tibial tuberosity to the digits B/L.  Increase warmth to left 4th toe  Mild nonpitting edema b/l dorsal feet  NO cellulitis/lymphangitis present, no Homans or calf pain    Neurological:   diminished light touch/epicritic sensation B/L  absent vibratory and sharp/dull sensations  absent protective sensation to plantar foot, b/l.    Dermatological:   Skin appears supple with  mild xerosis at the ball of the left foot. good color, texture, turgor. No open lesions present. No callosities present. Webspaces clean and dry 1-4 b/l. Nails 1-5 b/l appear normal.    Ecchymosis noted to entire dorsum of left 4th toe and some noted to dorsal left 3rd toe    Musculoskeletal/Orthopaedic:   Structurally within normal limits  No gross deformity of left 3rd or 4th toes  Very minimal soreness with palpation of left 4th toe  +5/5 muscle strength Dorsiflexion, Plantarflexion, Inversion, Eversion B/L  ROM of the 1st MTPJ is full b/l.  ROM of the MTJ/STJ is full without pain or crepitus b/l.  Ankle joint ROM is full  B/L  Rectus arch    Xray Left foot: No fractures or dislocations noted.  Joint spaces well maintained.    Lower extremities reveal no signs of deep venous thrombosis; calves and thighs are soft without swelling, induration or tenderness, Homan's sign is negative.    ASSESSMENT:  Contusion, left 3rd and 4th toes    PLAN:  Exam and evaluation.  Xrays left foot reviewed with patient  Follow up as needed  The patient indicates understanding of these issues and agrees with the plan.    Patient was advised to call the office, emergency advice line or page the resident on-call immediately, should they experience any problems, changes or have any questions. Alternatively, the patient was advised to go to the ER.    Tad J Borland DPM PGY-2     Statement of Resident/Student Supervision:  The resident/student was under my direct supervision during today's patient encounter. I am in agreement with the medical chart documentation, as this is a reflection of my personal examination, assessment and treatment plan.    Mateo Overbeck, D.P.M.

## 2014-08-18 NOTE — Progress Notes (Signed)
The patient, Eileen Barnes, identity was verified by name and MRN. Supervising provider for clinic visit: Dr. Antonietta BarcelonaMatalavage      An After Visit Summary was printed and given to the patient.        Chaperone offered and declined        .  B/p 158/80.  Pt. Asymptomatic.  Pt. Declined repeat b/p.

## 2014-09-04 ENCOUNTER — Telehealth

## 2014-09-04 MED ORDER — ATORVASTATIN CALCIUM 10 MG PO TABS
10 MG | ORAL_TABLET | ORAL | Status: AC
Start: 2014-09-04 — End: 2015-09-23

## 2014-09-04 NOTE — Telephone Encounter (Signed)
Eileen Barnes, can you assist in ordering labs for pt ?

## 2014-09-04 NOTE — Telephone Encounter (Signed)
Left message on patients TAD to call back to retrieve the below message. Please relay below message upon call back. Thank you.

## 2014-09-04 NOTE — Telephone Encounter (Signed)
Receipt confirmed by pharmacy.

## 2014-09-04 NOTE — Telephone Encounter (Signed)
Med sent to mail order. Aimi Essner M Laina Guerrieri

## 2014-09-04 NOTE — Telephone Encounter (Signed)
Patient requesting physician order the following lab tests: [facility]br has an appt 09-16-14 asking for complete labs    Patient may be reached at: [phone] (home)       Patient advised that office/PCP has 24-48 business hours to return their call.

## 2014-09-04 NOTE — Telephone Encounter (Signed)
Fasting labs in the computer. Yonatan Guitron M Lavanda Nevels

## 2014-09-04 NOTE — Telephone Encounter (Signed)
Name of medication:     atorvastatin (LIPITOR) 10 MG tablet 90 3 07/30/2013 08/18/2014     Sig - Route: TAKE 1 TABLET BY MOUTH ONE TIME DAILY - Oral              Prescription Number:  n/a    Pharmacy Name:  Mail order pharmacy         Contact number for member: [phone] (home)       Patient instructed to contact the pharmacy prior to picking up the medication.    Patient advised that office/PCP has 24-48 business hours to return their call.

## 2014-09-07 NOTE — Telephone Encounter (Signed)
Pt given msg., I have reviewed the provider's instructions with the patient, answering all questions to her satisfaction.

## 2014-09-10 ENCOUNTER — Encounter: Admit: 2014-09-10 | Payer: PRIVATE HEALTH INSURANCE | Primary: Family Medicine

## 2014-09-10 DIAGNOSIS — I10 Essential (primary) hypertension: Secondary | ICD-10-CM

## 2014-09-10 LAB — CBC WITH AUTO DIFFERENTIAL
Basophils %: 1.2 %
Basophils Absolute: 0.1 10*3/uL (ref 0.0–0.2)
Eosinophils %: 6.5 %
Eosinophils Absolute: 0.6 10*3/uL (ref 0.0–0.7)
Hematocrit: 42.8 % (ref 37.0–47.0)
Hemoglobin: 13.6 g/dL (ref 12.0–16.0)
Lymphocytes %: 25.7 %
Lymphocytes Absolute: 2.5 10*3/uL (ref 1.0–4.8)
MCH: 28.7 pg (ref 27.0–31.3)
MCHC: 31.7 % — ABNORMAL LOW (ref 33.0–37.0)
MCV: 90.6 fL (ref 82.0–100.0)
Monocytes %: 4.6 %
Monocytes Absolute: 0.4 10*3/uL (ref 0.2–0.8)
Neutrophils %: 62 %
Neutrophils Absolute: 6 10*3/uL (ref 1.4–6.5)
Platelets: 187 10*3/uL (ref 130–400)
RBC: 4.73 M/uL (ref 4.20–5.40)
RDW: 13.8 % (ref 11.5–14.5)
WBC: 9.6 10*3/uL (ref 4.8–10.8)

## 2014-09-10 LAB — LIPID PANEL
Cholesterol, Total: 127 mg/dL (ref 0–199)
HDL: 42 mg/dL (ref 40–59)
LDL Calculated: 52 mg/dL (ref 0–129)
Triglycerides: 163 mg/dL (ref 0–200)

## 2014-09-10 LAB — TSH WITH REFLEX TO FT4: TSH Reflex FT4: 2.99 u[IU]/mL (ref 0.270–4.200)

## 2014-09-10 LAB — COMPREHENSIVE METABOLIC PANEL
ALT: 20 U/L (ref 0–33)
AST: 19 U/L (ref 0–35)
Albumin: 4.4 g/dL (ref 3.9–4.9)
Alkaline Phosphatase: 91 U/L (ref 40–130)
Anion Gap: 13 mEq/L (ref 7–13)
BUN: 13 mg/dL (ref 8–23)
CO2: 28 mEq/L (ref 22–29)
Calcium: 9.2 mg/dL (ref 8.6–10.2)
Chloride: 100 mEq/L (ref 98–107)
Creatinine: 0.45 mg/dL — ABNORMAL LOW (ref 0.50–0.90)
GFR African American: >60 (ref >60)
GFR Non-African American: >60 (ref >60)
Globulin: 2 g/dL — ABNORMAL LOW (ref 2.3–3.5)
Glucose: 156 mg/dL — ABNORMAL HIGH (ref 74–109)
Potassium: 4.6 mEq/L (ref 3.5–5.1)
Sodium: 141 mEq/L (ref 132–144)
Total Bilirubin: 0.5 mg/dL (ref 0.0–1.2)
Total Protein: 6.4 g/dL (ref 6.4–8.1)

## 2014-09-10 LAB — HEMOGLOBIN A1C: Hemoglobin A1C: 7.4 % — ABNORMAL HIGH (ref 4.8–5.9)

## 2014-09-16 ENCOUNTER — Encounter: Attending: Family Medicine | Primary: Family Medicine

## 2014-09-16 ENCOUNTER — Ambulatory Visit: Admit: 2014-09-16 | Payer: PRIVATE HEALTH INSURANCE | Attending: Family Medicine | Primary: Family Medicine

## 2014-09-16 DIAGNOSIS — E1169 Type 2 diabetes mellitus with other specified complication: Secondary | ICD-10-CM

## 2014-09-16 DIAGNOSIS — E782 Mixed hyperlipidemia: Secondary | ICD-10-CM

## 2014-09-16 MED ORDER — ATENOLOL 50 MG PO TABS
50 MG | ORAL_TABLET | ORAL | Status: DC
Start: 2014-09-16 — End: 2014-10-14

## 2014-09-16 NOTE — Progress Notes (Signed)
The patient, Eileen Barnes, identity was verified by name and MRN. Supervising provider for clinic visit: Dr.Dardir.     Has the patient seen a provider outside of [facility] since their last visit? Yes chiropractor     Chaperone status: offered, declined    Diabetic status:  diabetic; shoes and socks removed   diabetic retinal exam up to date (within 1 year for pt with known retinopathy, within 2 years for pt with no known retinopathy   Last DRE 03/06/13

## 2014-09-16 NOTE — Progress Notes (Signed)
The patient, Eileen Barnes, identity was verified by name and MRN. Supervising provider for clinic visit: Dr.Dardir.  3 month recall done.  Flu shot given and tolerated well. VIS given.  An After Visit Summary was printed and given to the patient.  I have reviewed the provider's instructions with the patient, answering all questions to her satisfaction.

## 2014-09-16 NOTE — Patient Instructions (Signed)
As a Medicare member you are entitled to receive a Medicare Wellness Visit each year at no cost to you.  This visit includes a thorough review of your physical and emotional wellbeing, a complete Health Risk Assessment, and an attempt to identify any potential risks to your health.     During your forty minute visit we will cover things about your medical history, update your medical records, discuss the best ways to keep you healthy in the future, and much more. This is an opportunity you don???t want to miss.     Please call the [facility] directly at [phone] to schedule your free appointment.

## 2014-09-17 NOTE — Progress Notes (Signed)
Subjective:      Patient ID: Eileen A WhiteAlcide Barnes is a 63 y.o. female.    Diabetes  She presents for her follow-up diabetic visit. She has type 2 diabetes mellitus. Pertinent negatives for hypoglycemia include no confusion, dizziness, headaches, hunger, mood changes, nervousness/anxiousness, pallor, seizures, sleepiness, speech difficulty, sweats or tremors. Pertinent negatives for diabetes include no blurred vision, no chest pain, no fatigue, no foot paresthesias, no foot ulcerations, no polydipsia, no polyphagia and no polyuria. There are no hypoglycemic complications. Pertinent negatives for diabetic complications include no autonomic neuropathy, CVA, heart disease, impotence, nephropathy, peripheral neuropathy, PVD or retinopathy. Risk factors for coronary artery disease include dyslipidemia and post-menopausal. Current diabetic treatment includes oral agent (monotherapy). She is compliant with treatment most of the time. She is following a generally healthy diet. There is no change in her home blood glucose trend. An ACE inhibitor/angiotensin II receptor blocker is being taken.   Hypertension  This is a chronic problem. The problem is controlled. Pertinent negatives include no blurred vision, chest pain, headaches or sweats. There are no associated agents to hypertension. Risk factors for coronary artery disease include diabetes mellitus and dyslipidemia. Past treatments include angiotensin blockers and beta blockers. The current treatment provides significant improvement. There are no compliance problems.  There is no history of CVA, PVD or retinopathy.       Review of Systems   Constitutional: Negative for fatigue.   Eyes: Negative for blurred vision.   Cardiovascular: Negative for chest pain.   Endocrine: Negative for polydipsia, polyphagia and polyuria.   Genitourinary: Negative for impotence.   Skin: Negative for pallor.   Neurological: Negative for dizziness, tremors, seizures, speech difficulty and headaches.    Psychiatric/Behavioral: Negative for confusion. The patient is not nervous/anxious.        Objective:   Physical Exam  BP 164/84 mmHg   Pulse 64   Temp(Src) 97.4 ??F (36.3 ??C) (Oral)   Resp 20   Ht 5\' 7"  (1.702 m)   Wt 237 lb (107.502 kg)   BMI 37.11 kg/m2  General appearance: alert, well appearing, and in no distress.  Chest: clear to auscultation, no wheezes, rales or rhonchi, symmetric air entry.   CVS exam: normal rate, regular rhythm, normal S1, S2, no murmurs, rubs, clicks or gallops.  Exam of extremities: peripheral pulses normal, no pedal edema, no clubbing or cyanosis  Lab Results   Component Value Date    LABA1C 7.4* 09/10/2014    LABA1C 7.7* 02/12/2014    LABA1C 7.0* 10/06/2013     Lab Results   Component Value Date    GLUF 154* 12/08/2011    LABMICR <1.20 02/12/2014    LDLCALC 52 09/10/2014    CREATININE 0.45* 09/10/2014     Eileen Barnes was seen today for diabetes and medication refill.    Diagnoses and associated orders for this visit:    DM type 2 with diabetic mixed hyperlipidemia (HCC)  Patient refuse to restart glipizide  The patient is asked to make an attempt to improve diet and exercise patterns to aid in medical management of this problem.  Last A1C need improvement      HTN (hypertension), benign  The current medical regimen is effective;  continue present plan and medications.  - atenolol (TENORMIN) 50 MG tablet; TAKE ONE TABLET BY MOUTH TWICE A DAY    Other Orders  - FLUVIRIN 0.5 ML 4 YEARS AND OVER [IMM20]

## 2014-10-14 ENCOUNTER — Encounter

## 2014-10-14 MED ORDER — ATENOLOL 50 MG PO TABS
50 MG | ORAL_TABLET | ORAL | Status: AC
Start: 2014-10-14 — End: ?

## 2014-10-15 ENCOUNTER — Encounter

## 2014-10-15 MED ORDER — GLUCOSE BLOOD VI STRP
Status: AC
Start: 2014-10-15 — End: ?

## 2014-11-26 NOTE — Care Coordination-Inpatient (Signed)
Hi Eileen Barnes,  Please call patient to schedule 2 year  bariatric appointment with Dr. Benjaman KindlerNowak. DOS 10/14/12.  Please tell patient to have lab work done 1 week before appointment.  Send letter if unable to reach patient  Thank you,  Abbey Chattersonnie Kody Brandl RN BSN  Bariatric Case Manager  [facility]  Phone: [phone]  Fax: [phone]

## 2014-11-30 NOTE — Progress Notes (Signed)
Thank you

## 2014-11-30 NOTE — Progress Notes (Signed)
LM on TAD to call [phone] for Bariatric follow up. Letter sent

## 2015-02-15 ENCOUNTER — Encounter: Attending: Dermatology | Primary: Family Medicine

## 2015-06-16 ENCOUNTER — Encounter: Payer: Self-pay | Admitting: Gastroenterology

## 2015-07-11 IMAGING — CR DG CHEST 2V
2 series · 2 of 2 positions shown · non-contrast
Comparison: 11/25/2005

CLINICAL DATA: Left-sided chest pain

EXAM:
CHEST  2 VIEW

[w chest pa]
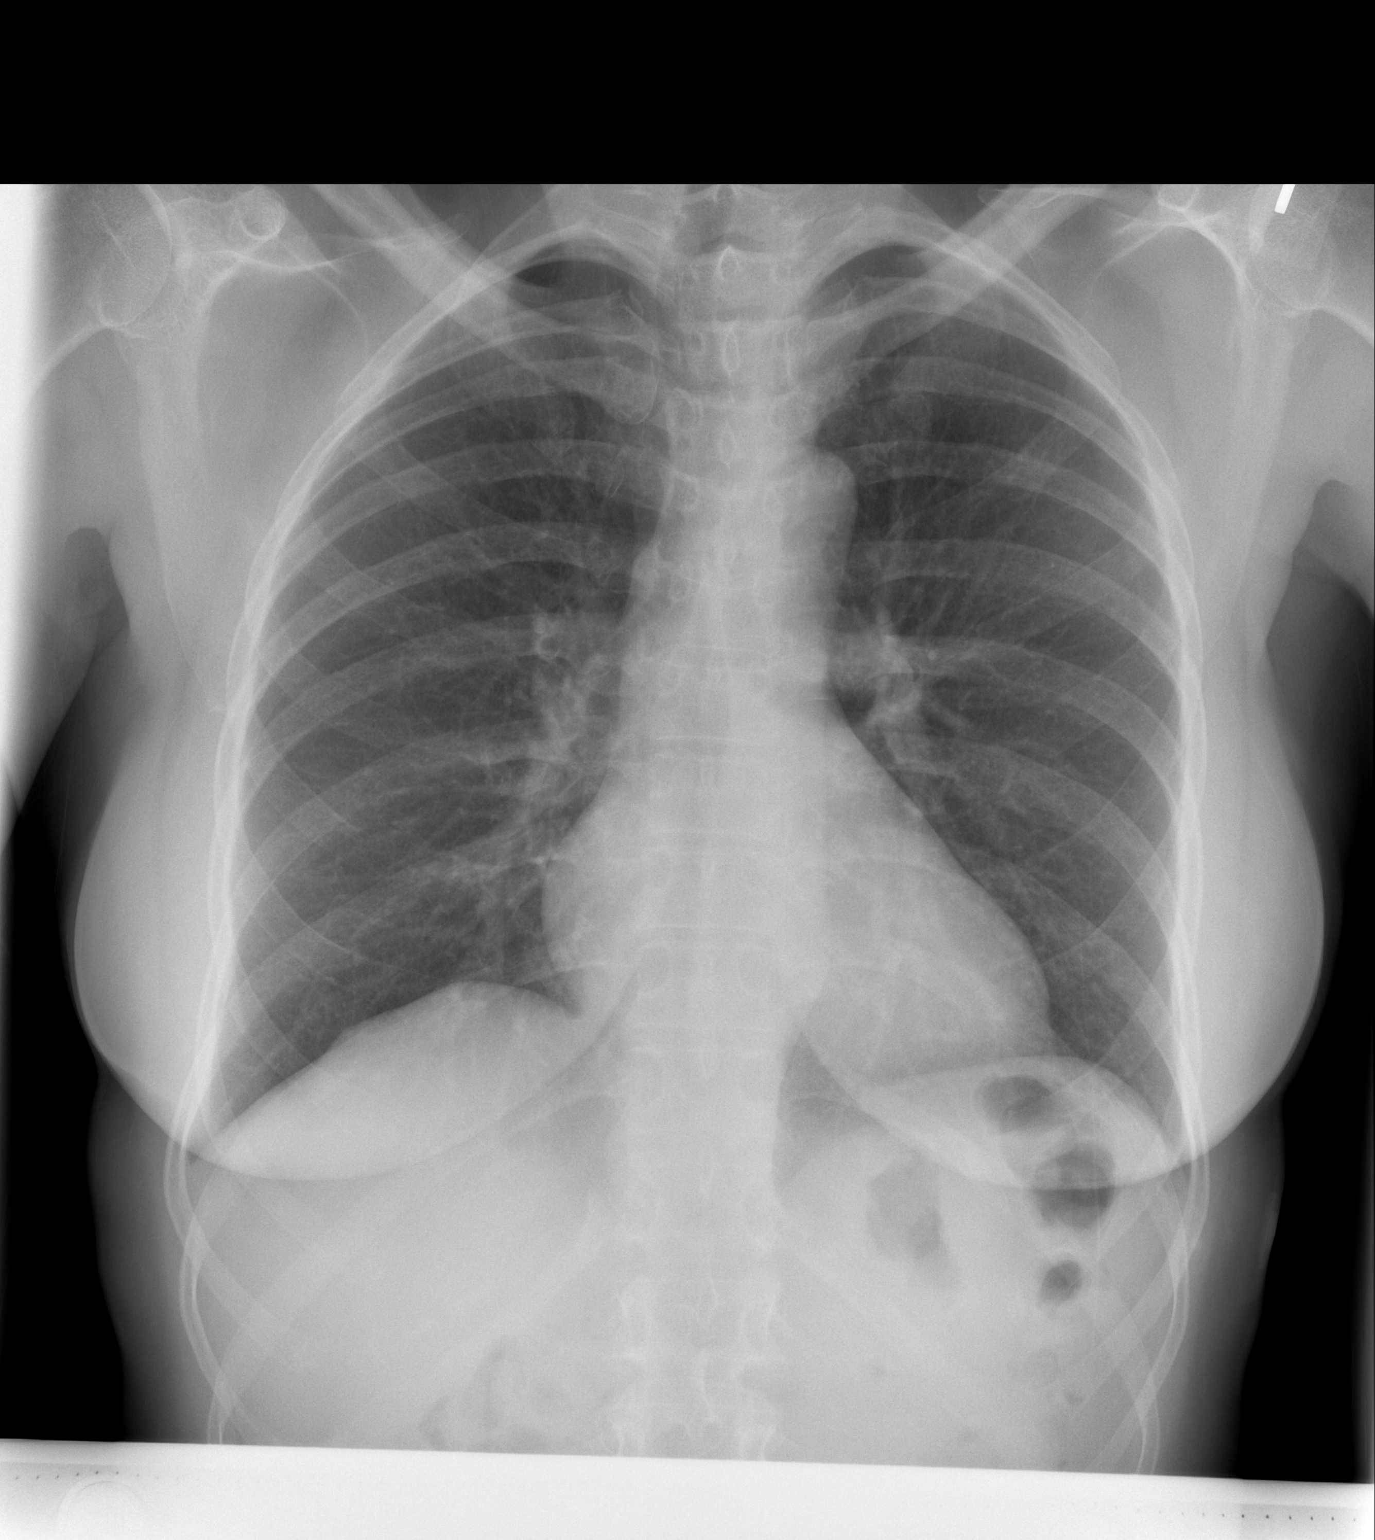

[w chest lat]
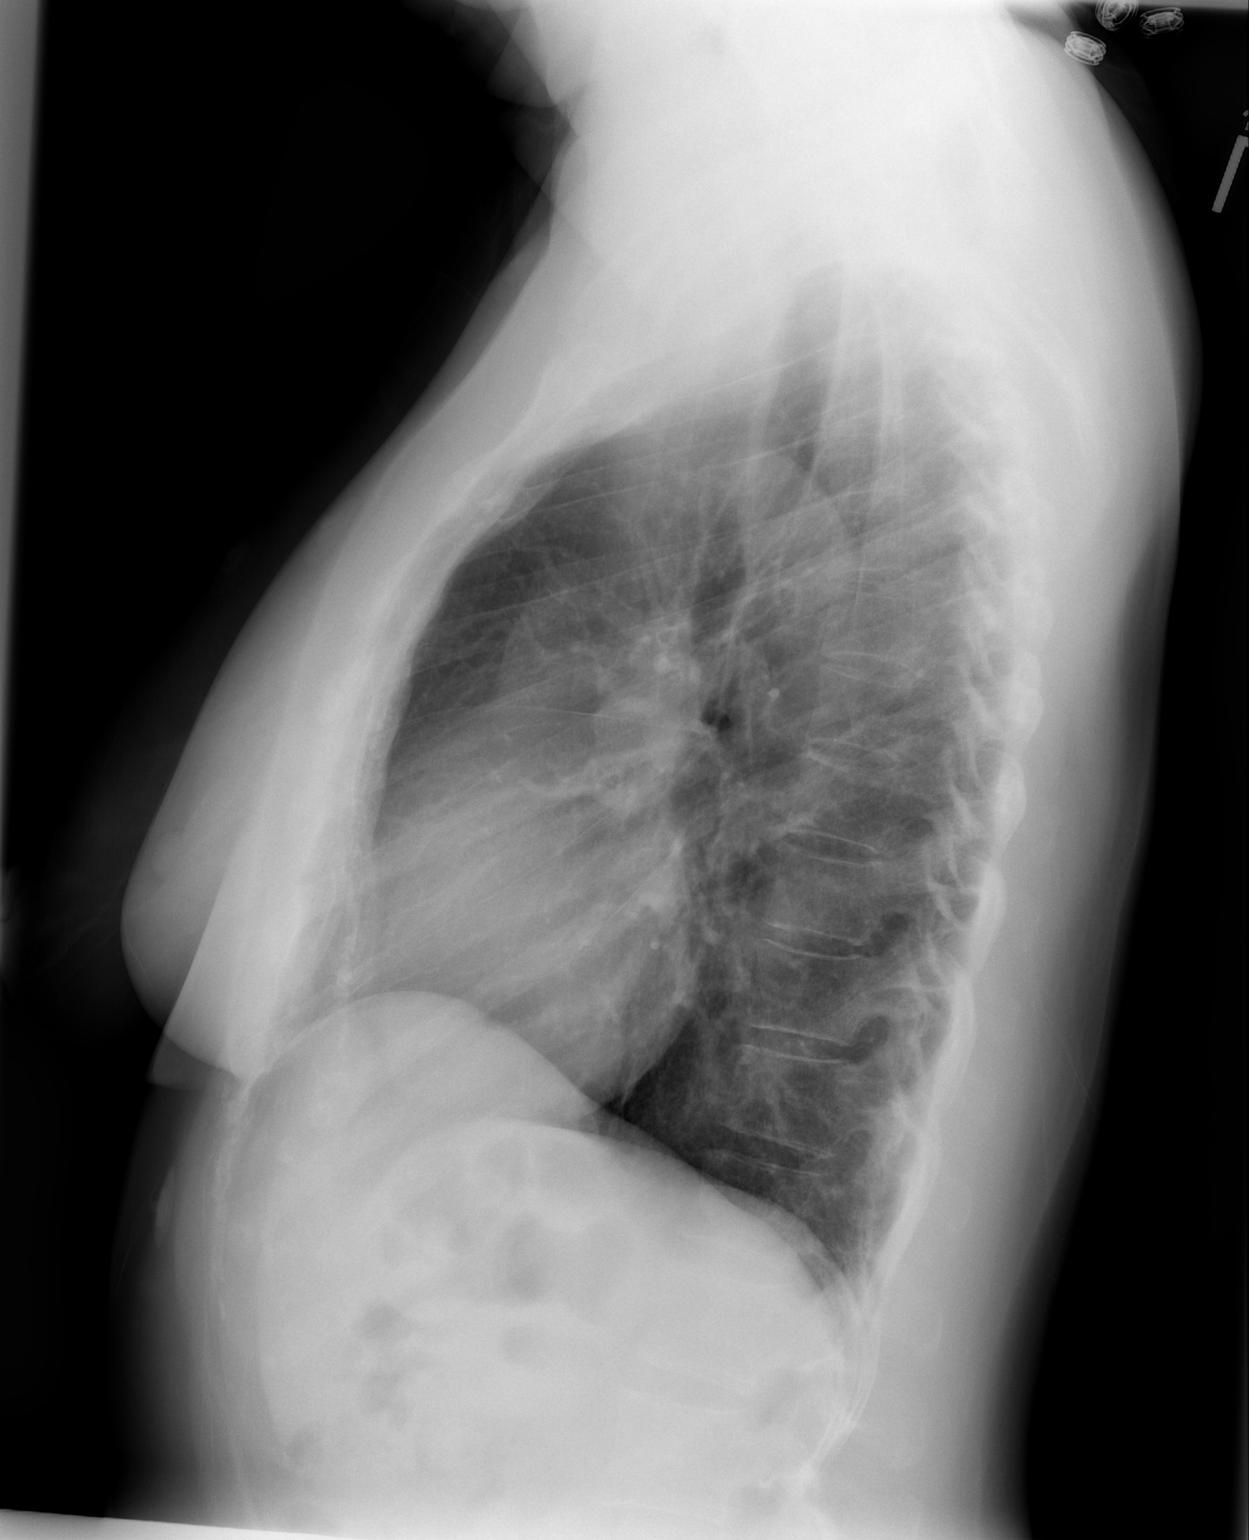

[2 of 2 positions shown; findings below may reference images not displayed]

FINDINGS: The heart size and mediastinal contours are within normal limits.
Both lungs are clear. The visualized skeletal structures are
unremarkable.
IMPRESSION: No active cardiopulmonary disease.

## 2015-08-04 ENCOUNTER — Ambulatory Visit (AMBULATORY_SURGERY_CENTER): Payer: Self-pay

## 2015-08-04 VITALS — Ht 65.0 in | Wt 162.0 lb

## 2015-08-04 DIAGNOSIS — Z800 Family history of malignant neoplasm of digestive organs: Secondary | ICD-10-CM

## 2015-08-04 MED ORDER — SUPREP BOWEL PREP KIT 17.5-3.13-1.6 GM/177ML PO SOLN: 1.0000 | Freq: Once | ORAL | Status: DC

## 2015-08-04 NOTE — Progress Notes (Signed)
No allergies to eggs or soy No diet/weight loss meds No home oxygen No past problems with anesthesia  Has email  Emmi instructions given for colonoscopy 

## 2015-08-16 ENCOUNTER — Telehealth: Payer: Self-pay | Admitting: Gastroenterology

## 2015-08-16 NOTE — Telephone Encounter (Signed)
This is a Teacher, adult education patient. Not sure why she is scheduled with Dr. Havery Moros.

## 2015-08-18 ENCOUNTER — Encounter: Payer: Self-pay | Admitting: Gastroenterology

## 2015-08-18 ENCOUNTER — Ambulatory Visit (AMBULATORY_SURGERY_CENTER): Payer: Medicare HMO | Admitting: Gastroenterology

## 2015-08-18 VITALS — BP 128/74 | HR 57 | Temp 98.5°F | Resp 27 | Ht 65.0 in | Wt 162.0 lb

## 2015-08-18 DIAGNOSIS — Z1211 Encounter for screening for malignant neoplasm of colon: Secondary | ICD-10-CM | POA: Diagnosis not present

## 2015-08-18 DIAGNOSIS — Z800 Family history of malignant neoplasm of digestive organs: Secondary | ICD-10-CM | POA: Diagnosis present

## 2015-08-18 DIAGNOSIS — D124 Benign neoplasm of descending colon: Secondary | ICD-10-CM | POA: Diagnosis not present

## 2015-08-18 MED ORDER — SODIUM CHLORIDE 0.9 % IV SOLN: 500.0000 mL | INTRAVENOUS | Status: DC

## 2015-08-18 NOTE — Progress Notes (Signed)
Report to PACU, RN, vss, BBS= Clear.  

## 2015-08-18 NOTE — Progress Notes (Signed)
Called to room to assist during endoscopic procedure.  Patient ID and intended procedure confirmed with present staff. Received instructions for my participation in the procedure from the performing physician.  

## 2015-08-18 NOTE — Op Note (Signed)
[facility]  [address] [place], [address]   COLONOSCOPY PROCEDURE REPORT  PATIENT: Angelica Mendoza, Angelica Mendoza  MR#: [id number] BIRTHDATE: September 02, 1951 , 40  yrs. old GENDER: female ENDOSCOPIST: Yetta Flock, MD REFERRED BY: PROCEDURE DATE:  08/18/2015 PROCEDURE:   Colonoscopy with biopsy and Colonoscopy, screening First Screening Colonoscopy - Avg.  risk and is 50 yrs.  old or older - No.  Prior Negative Screening - Now for repeat screening. 10 or more years since last screening  History of Adenoma - Now for follow-up colonoscopy & has been > or = to 3 yrs.  N/A  Polyps removed today? Yes ASA CLASS:   Class II INDICATIONS:Screening for colonic neoplasia and FH Colon or Rectal Adenocarcinoma (first degree relative < age 25). MEDICATIONS: Propofol 200 mg IV  DESCRIPTION OF PROCEDURE:   After the risks benefits and alternatives of the procedure were thoroughly explained, informed consent was obtained.  The digital rectal exam revealed no abnormalities of the rectum.   The LB [id number]  endoscope was introduced through the anus and advanced to the cecum, which was identified by both the appendix and ileocecal valve. No adverse events experienced.   The quality of the prep was adequate  The instrument was then slowly withdrawn as the colon was fully examined. Estimated blood loss is zero unless otherwise noted in this procedure report.    COLON FINDINGS: Three sessile polyps measuring 3 mm in size were found in the descending colon.  Polypectomies were performed with cold forceps.  The resection was complete, the polyp tissue was completely retrieved and sent to histology.   The examination was otherwise normal.  Retroflexed views revealed internal hemorrhoids. The time to cecum = 2.8 Withdrawal time = 14.1   The scope was withdrawn and the procedure completed. COMPLICATIONS: There were no immediate complications.  ENDOSCOPIC IMPRESSION: 1.   Three  sessile polyps were found in the descending colon; polypectomies were performed with cold forceps 2.   The examination was otherwise normal  RECOMMENDATIONS: 1.  Await pathology results, further recommendations pending this result. 2.  Resume diet 3.  Resume medications  eSigned:  Yetta Flock, MD 08/18/2015 8:55 AM   cc:

## 2015-08-18 NOTE — Patient Instructions (Signed)
Colon polyps removed today. Handout on polyps given to you.  Resume current medications. Call us with any questions or concerns. Thank you!  YOU HAD AN ENDOSCOPIC PROCEDURE TODAY AT [facility]:   Refer to the procedure report that was given to you for any specific questions about what was found during the examination.  If the procedure report does not answer your questions, please call your gastroenterologist to clarify.  If you requested that your care partner not be given the details of your procedure findings, then the procedure report has been included in a sealed envelope for you to review at your convenience later.  YOU SHOULD EXPECT: Some feelings of bloating in the abdomen. Passage of more gas than usual.  Walking can help get rid of the air that was put into your GI tract during the procedure and reduce the bloating. If you had a lower endoscopy (such as a colonoscopy or flexible sigmoidoscopy) you may notice spotting of blood in your stool or on the toilet paper. If you underwent a bowel prep for your procedure, you may not have a normal bowel movement for a few days.  Please Note:  You might notice some irritation and congestion in your nose or some drainage.  This is from the oxygen used during your procedure.  There is no need for concern and it should clear up in a day or so.  SYMPTOMS TO REPORT IMMEDIATELY:   Following lower endoscopy (colonoscopy or flexible sigmoidoscopy):  Excessive amounts of blood in the stool  Significant tenderness or worsening of abdominal pains  Swelling of the abdomen that is new, acute  Fever of 100F or higher   For urgent or emergent issues, a gastroenterologist can be reached at any hour by calling [phone].   DIET: Your first meal following the procedure should be a small meal and then it is ok to progress to your normal diet. Heavy or fried foods are harder to digest and may make you feel nauseous or bloated.  Likewise,  meals heavy in dairy and vegetables can increase bloating.  Drink plenty of fluids but you should avoid alcoholic beverages for 24 hours.  ACTIVITY:  You should plan to take it easy for the rest of today and you should NOT DRIVE or use heavy machinery until tomorrow (because of the sedation medicines used during the test).    FOLLOW UP: Our staff will call the number listed on your records the next business day following your procedure to check on you and address any questions or concerns that you may have regarding the information given to you following your procedure. If we do not reach you, we will leave a message.  However, if you are feeling well and you are not experiencing any problems, there is no need to return our call.  We will assume that you have returned to your regular daily activities without incident.  If any biopsies were taken you will be contacted by phone or by letter within the next 1-3 weeks.  Please call us at [phone] if you have not heard about the biopsies in 3 weeks.    SIGNATURES/CONFIDENTIALITY: You and/or your care partner have signed paperwork which will be entered into your electronic medical record.  These signatures attest to the fact that that the information above on your After Visit Summary has been reviewed and is understood.  Full responsibility of the confidentiality of this discharge information lies with you and/or your care-partner.

## 2015-08-19 ENCOUNTER — Telehealth: Payer: Self-pay

## 2015-08-19 NOTE — Telephone Encounter (Signed)
Left message on answering machine. 

## 2015-08-24 ENCOUNTER — Encounter: Payer: Self-pay | Admitting: Gastroenterology

## 2020-09-01 ENCOUNTER — Encounter: Payer: Self-pay | Admitting: Gastroenterology

## 2020-12-27 ENCOUNTER — Encounter: Payer: Self-pay | Admitting: Gastroenterology

## 2022-08-01 NOTE — Progress Notes (Signed)
Formatting of this note is different from the original.  Chief Complaint   Patient presents with    Left Knee - Follow-up     TKR 08/11/21    doing well  no c/o pain        Electronically signed by Randa Ngo, MA at 08/01/2022  4:09 PM EDT

## 2022-08-01 NOTE — Progress Notes (Signed)
Formatting of this note is different from the original.  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  He did have cellulitis of her left lower extremity earlier this summer and was on antibiotics.  She also states that she was bitten by her dog along the posterior aspect of her lower calf she states is taking several months for this to slowly granulate in.  She is diabetic which makes his healing slower.  She has been on 2 different antibiotics to help this process.    Objective    Examination of her left knee reveals no swelling no ecchymosis nor erythema no cellulitis good range of motion from 0-120 degrees.  Calf is soft supple nontender.  Neurovascular motor and sensory is intact.  Does have a 1 cm diameter scab over the posterior aspect of her lower calf there is no evidence of any erythema or drainage around this site.  Calf is soft supple nontender.   XR knee 3 views left   Standing AP lateral sunrise views left knee shows good overall alignment position of the prosthetic components with no lucency or fracture seen.      Assessment   Diagnosis Plan   1. Left knee pain, unspecified chronicity  XR knee 3 views left     2. Status post left knee replacement         Encounter Diagnoses   Name Primary?    Left knee pain, unspecified chronicity Yes    Status post left knee replacement      Plan    I am very pleased with her progress I would like her to continue be aggressive with her exercises.  I asked her to keep a close eye on the small ulceration on her calf if he starts developing any fever chills increased knee pain to call me immediately.  I would like her to keep me posted on how she is progressing.  We will see her back as needed.    Vitals:    08/01/22 1307   Temp: 97.6 F        Past Medical History:   Diagnosis Date    Anxiety 08/01/2022    Arthritis 08/01/2022    Colon polyps 08/01/2022    Diabetic peripheral neuropathy associated with type 2 diabetes mellitus (CMS/HCC) 08/03/2006    Hyperlipidemia (CMS/HCC) 05/30/2022    Hypertension (CMS/HCC) 07/07/2005    IBS (irritable bowel syndrome) 08/01/2022    Type 2 diabetes mellitus (CMS/HCC) 09/16/2014     Past Surgical History:   Procedure Laterality Date    APPENDECTOMY  1961    GASTRIC BYPASS  1988    TONSILECTOMY, ADENOIDECTOMY, BILATERAL MYRINGOTOMY AND TUBES  1961    TOTAL KNEE ARTHROPLASTY Left 08/11/2021       Current Outpatient Medications on File Prior to Visit   Medication Sig Dispense Refill    allopurinol (Zyloprim) 100 MG tablet Take by mouth.      amLODIPine (Norvasc) 2.5 MG tablet       aspirin 81 MG EC tablet Take by mouth.      atenolol (Tenormin) 50 MG tablet Take by mouth.  atorvastatin (Lipitor) 20 MG tablet 1 (one) time each day at the same time.      cholecalciferol (Vitamin D-3) 50 MCG (2000 UT) tablet       glimepiride (Amaryl) 4 MG tablet Take by mouth.      losartan (Cozaar) 100 MG tablet Take by mouth.      magnesium oxide (Mag-Ox) 400 mg tablet every 12 (twelve) hours.      Mounjaro 2.5 MG/0.5ML solution pen-injector 2.5 mg.      torsemide (Demadex) 10 MG tablet Take by mouth.      dulaglutide (Trulicity) 1.5 FA/2.1HY solution pen-injector        No current facility-administered medications on file prior to visit.       Social Connections: Not on file       Allergies   Allergen Reactions    Hydrocodone-Acetaminophen      Vomiting    Kiwi Extract Swelling     Pt. States throat closes up    Lisinopril      Other Reaction(s): coughing    Cough    Lovastatin     Morphine And Related Nausea Only     Other Reaction(s): nausea/vomiting, Unknown    Nortriptyline      Severe constipation    Pentazocine Nausea Only     Other Reaction(s): Unknown    Soap     Tramadol Nausea Only       Family History   Family history  unknown: Yes         Electronically signed by Lenoard Aden, DO at 08/01/2022  4:09 PM EDT

## 2023-03-07 NOTE — Progress Notes (Signed)
Formatting of this note is different from the original.  Chief Complaint   Patient presents with    Lower Back - Pain     PL 9           Electronically signed by Nadara Eaton, MA at 03/07/2023 11:38 AM EDT

## 2023-03-07 NOTE — Progress Notes (Signed)
Formatting of this note is different from the original.  Chief Complaint   Patient presents with    Lower Back - Pain     PL 9           HPI    Low back pain times 2 months.    Subjective    Eileen Barnes is here with her husband she states that she has been having on and off sharp stabbing pain in her lower back now for the past several months.  She states this occurred several years ago in the past she is tried physical therapy Medrol Dosepaks anti-inflammatories with minimal improvement.  She has difficulty walking even short distances because of her significant low back pain.    Objective    Examination of her bilateral lower extremities shows full range motion the hip joints normal limits.  Deep tendon reflexes Achilles and patellar bilaterally are 2/4.  EHL 3/5.  Negative straight leg raising.  Points to the left lumbar  as the area of significant discomfort.   XR lumbar spine 2 or 3 views   AP lateral x-rays lumbar spine shows a mild scoliosis.  Multilevel spondylosis.      Assessment   Diagnosis Plan   1. Lumbar spine pain  XR lumbar spine 2 or 3 views    MR lumbar spine wo contrast     2. Lumbar spondylosis         Encounter Diagnoses   Name Primary?    Lumbar spine pain Yes    Lumbar spondylosis      Plan    I reviewed the x-rays with Eileen Barnes and her husband.  She is already tried Medrol Dosepak physical therapy and anti-inflammatories with no significant improvement.  I would like to obtain MRI scan of her lumbar spine after which time we will refer to pain management for possible lumbar injections.  I asked her to call me upon completion of her MRI scan.    Vitals:    03/07/23 1052   Temp: 97.8 F       Past Medical History:   Diagnosis Date    Anxiety 08/01/2022    Arthritis 08/01/2022    Colon polyps 08/01/2022    Diabetic peripheral neuropathy associated with type 2 diabetes mellitus (CMS/HCC) 08/03/2006    Frozen shoulder 2000    Hyperlipidemia (CMS/HCC) 05/30/2022    Hypertension (CMS/HCC) 07/07/2005    IBS  (irritable bowel syndrome) 08/01/2022    Low back sprain 2013    Low back strain     Lumbosacral disc disease 2009    Type 2 diabetes mellitus (CMS/HCC) 09/16/2014     Past Surgical History:   Procedure Laterality Date    APPENDECTOMY  1961    GASTRIC BYPASS  1988    KNEE ARTHROPLASTY  9-22    TONSILECTOMY, ADENOIDECTOMY, BILATERAL MYRINGOTOMY AND TUBES  1961    TOTAL KNEE ARTHROPLASTY Left 08/11/2021       Current Outpatient Medications on File Prior to Visit   Medication Sig Dispense Refill    atenolol (Tenormin) 50 MG tablet Take by mouth.      losartan (Cozaar) 100 MG tablet Take by mouth.      magnesium oxide (Mag-Ox) 400 mg tablet every 12 (twelve) hours.      sertraline (Zoloft) 100 MG tablet Take by mouth      Tirzepatide (Mounjaro) 5 MG/0.5ML solution pen-injector Inject under the skin      torsemide (Demadex) 10 MG tablet Take by mouth.  amLODIPine (Norvasc) 2.5 MG tablet       aspirin 81 MG EC tablet Take by mouth.      atorvastatin (Lipitor) 20 MG tablet 1 (one) time each day at the same time.      cholecalciferol (Vitamin D-3) 50 MCG (2000 UT) tablet       traZODone (Desyrel) 50 MG tablet Take 50 mg by mouth at bedtime      [DISCONTINUED] allopurinol (Zyloprim) 100 MG tablet Take by mouth.      [DISCONTINUED] dulaglutide (Trulicity) 1.5 MG/0.5ML solution pen-injector       [DISCONTINUED] glimepiride (Amaryl) 4 MG tablet Take by mouth.      [DISCONTINUED] Mounjaro 2.5 MG/0.5ML solution pen-injector 2.5 mg.       No current facility-administered medications on file prior to visit.       Social Connections: Not on file       Allergies   Allergen Reactions    Hydrocodone-Acetaminophen      Vomiting    Kiwi Extract Swelling     Pt. States throat closes up    Lisinopril      Other Reaction(s): coughing    Cough    Lovastatin     Morphine And Related Nausea Only     Other Reaction(s): nausea/vomiting, Unknown    Nortriptyline      Severe constipation    Pentazocine Nausea Only     Other Reaction(s):  Unknown    Soap     Tramadol Nausea Only       Family History   Problem Relation Name Age of Onset    Diabetes Father Chapman Moss          Electronically signed by Blanca Friend, DO at 03/07/2023 11:38 AM EDT

## 2023-03-12 NOTE — Telephone Encounter (Signed)
Formatting of this note might be different from the original.  Patient calling for the results of the MRI of her lumbar spine did advise patient Dr. Jillyn Hidden to address when in office tomorrow.   Thank you  Electronically signed by Kathyrn Sheriff at 03/12/2023  9:25 AM EDT

## 2023-03-14 NOTE — Telephone Encounter (Signed)
Formatting of this note might be different from the original.  MRI printed for LL to review   Electronically signed by Nadara Eaton, MA at 03/14/2023 10:25 AM EDT

## 2023-03-14 NOTE — Telephone Encounter (Signed)
Formatting of this note might be different from the original.  Spoke with patient gave her several names she will call back to let me know when she makes an appointment and I will fax her info.   Electronically signed by Kathyrn Sheriff at 03/14/2023  2:55 PM EDT

## 2023-03-14 NOTE — Telephone Encounter (Signed)
Formatting of this note might be different from the original.  Printed for LL. Left on his desk for review.  Electronically signed by Lorelei Pont, LPN at 16/08/9603  1:45 PM EDT

## 2023-03-14 NOTE — Telephone Encounter (Signed)
Formatting of this note might be different from the original.  Patient called again and left a message on SD line stating that she had a MRI done last Thursday and is asking for the results. Patient is asking if she needs to have a follow up with Dr. Jillyn Hidden or will she will be called with the results. Please advise.   Electronically signed by Newell Coral at 03/14/2023 10:19 AM EDT

## 2023-10-18 NOTE — Telephone Encounter (Signed)
 Copied from CRM [id number]. Topic: Access To Clinicians - Req Clinic Call Back >> Oct 18, 2023 12:04 PM Creighton S wrote: The patient is requesting for the Front Desk to contact them in regards to ER hospital admission follow-up appointment. Patient was discharged on 12/05. The first available appt is > 14 days out. PT prefers to see Dr. Alyse or Dr Keven. Please contact The patient by Cell Phone

## 2023-10-22 NOTE — Progress Notes (Signed)
 Assessment/Plan:   Angelica Mendoza was seen today for hospitalization follow-up.  Diagnoses and all orders for this visit:  Sore throat -     doxycycline (VIBRAMYCIN) 100 MG capsule; 1 bid for 10 days -     benzonatate (TESSALON) 200 MG capsule; Take 1 capsule (200 mg total) by mouth Three (3) times a day as needed for up to 7 days. -     POCT Rapid Group A Strep  Productive cough -     doxycycline (VIBRAMYCIN) 100 MG capsule; 1 bid for 10 days -     benzonatate (TESSALON) 200 MG capsule; Take 1 capsule (200 mg total) by mouth Three (3) times a day as needed for up to 7 days. -     RAPID INFLUENZA/RSV/COVID PCR  Nasal congestion -     doxycycline (VIBRAMYCIN) 100 MG capsule; 1 bid for 10 days -     benzonatate (TESSALON) 200 MG capsule; Take 1 capsule (200 mg total) by mouth Three (3) times a day as needed for up to 7 days. -     RAPID INFLUENZA/RSV/COVID PCR  Elevated BP without diagnosis of hypertension  History of constipation  The pt is seen today for recent ED follow up visit. She was seen at [facility] ED on 10/18/2023. She presented with nausea, weakness and BP of 197/91. I last saw the pt in 04/2023 for CPE visit. Her PMH is remarkable for GERD, urinary incontinence, dyslipidemia, thyroid nodule, osteopenia, constipation and osteopenia.  [facility] ED details are as follows:  Patient is well-appearing, hypertensive with systolic blood pressure of 197, otherwise afebrile with a normal heart rate.  On physical exam lungs are clear to auscultation bilaterally without any wheezing or crackles.  Abdomen is soft, nontender, nondistended. On neurologic exam extraocular movement is intact, vision is intact, extraocular movement without nystagmus, face is symmetric, tongue is midline sensation and facial is intact.  Arm raise without pronator drift, strength is 5 out of 5 in upper extremities bilateral, sensation is intact bilateral, finger to nose without dysmetria.  Lower extremities with strength 5 out of 5  bilateral, sensation intact bilaterally.  She has 2+ pulses in her upper and lower extremities bilaterally.  No lower extremity edema.  BP 176/74   Pulse 66   Temp 36.9 C (98.5 F) (Oral) Comment: Simultaneous filing. User may not have seen previous data.  Resp 18   Ht 165.1 cm (5' 5)   Wt 71.7 kg (158 lb)   SpO2 97%   BMI 26.29 kg/m  Medical Decision Making 72 year old female coming in with hypertension, feeling weak and nauseous.  Differential diagnosis includes but is not limited to electrolyte abnormality, anemia, metabolic abnormality, viral illness.  Patient has benign abdominal exam, was able to have a large bowel movement on Tuesday so low concern for an obstruction at this time.  Is not having any tenderness that would be suggestive of diverticulitis.  Would also consider ACS, will check EKG and troponin given patient's nausea.  Also evaluate for evidence of hypertensive urgency, reassuring patient does not have altered mental status, no headache or vision changes. Further ED updates and updates to plan as per ED Course below: ED Course: CBC negative for leukocytosis, normal hemoglobin, normal platelets.  Chest x-ray negative for focal consolidation, pneumothorax or pleural effusion.  Patient signed out to oncoming provider at 7 AM pending further evaluation.  Fortunately patient's nausea has resolved without requiring antiemetics.  Reassessed patient, she is feeling much improved since being in the ED.  Reviewed results of labs, chest x-ray and EKG with her. Discussed using miralax for her constipation; prescription provided today. Patient will follow up with her primary care provider. Return precautions reviewed. Patient is agreeable with the plan.   The pt has no history of HTN.   She is on no chronic medications except for prn MIralax. She was discharged from ED on Miralax.   She has taken omeprazole in the past for GERD symptoms. She had an echocardiogram in 04/2022 which revealed  LVEF of 55-60%. ED labs revealed essentially normal CMP, normal CBC and TSH and HS troponin. Her Covid 19, RSV and flu testing was negative.  Pt was doing well and her home BP readings normalized after 3 days. Her BP today is 124/84. She has more recent symptoms sore throat, sinus congestion and dry cough and right sided chest pain. She did have 1 episode of green mucus.   These symptoms started 4 days ago.  Covid 19 vaccinated : yes but no recent booster Known recent Covid 19 exposure: no Recent home Covid 19 testing - no  Her respiratory exam is normal today.  I recommend a repeat rapid respiratory panel along with rapid strep test. She will quarantine until results available.   She is started on Doxycycline 100 mg bid for 10 days along with Tessalon pearls prn cough. She may take OTC remedies for nasal congestion symptoms. She will continue home BP monitoring to ensure normal values Continue Miralax as directed.    No follow-ups on file.  Subjective:   HPI The pt is seen today for recent ED follow up visit. She was seen at [facility] ED on 10/18/2023. She presented with nausea, weakness and BP of 197/91. I last saw the pt in 04/2023 for CPE visit. Her PMH is remarkable for GERD, urinary incontinence, dyslipidemia, thyroid nodule, osteopenia, constipation and osteopenia.  [facility] ED details are as follows:  Patient is well-appearing, hypertensive with systolic blood pressure of 197, otherwise afebrile with a normal heart rate.  On physical exam lungs are clear to auscultation bilaterally without any wheezing or crackles.  Abdomen is soft, nontender, nondistended. On neurologic exam extraocular movement is intact, vision is intact, extraocular movement without nystagmus, face is symmetric, tongue is midline sensation and facial is intact.  Arm raise without pronator drift, strength is 5 out of 5 in upper extremities bilateral, sensation is intact bilateral, finger to nose without dysmetria.  Lower  extremities with strength 5 out of 5 bilateral, sensation intact bilaterally.  She has 2+ pulses in her upper and lower extremities bilaterally.  No lower extremity edema.  BP 176/74   Pulse 66   Temp 36.9 C (98.5 F) (Oral) Comment: Simultaneous filing. User may not have seen previous data.  Resp 18   Ht 165.1 cm (5' 5)   Wt 71.7 kg (158 lb)   SpO2 97%   BMI 26.29 kg/m  Medical Decision Making 72 year old female coming in with hypertension, feeling weak and nauseous.  Differential diagnosis includes but is not limited to electrolyte abnormality, anemia, metabolic abnormality, viral illness.  Patient has benign abdominal exam, was able to have a large bowel movement on Tuesday so low concern for an obstruction at this time.  Is not having any tenderness that would be suggestive of diverticulitis.  Would also consider ACS, will check EKG and troponin given patient's nausea.  Also evaluate for evidence of hypertensive urgency, reassuring patient does not have altered mental status, no headache or vision changes. Further ED  updates and updates to plan as per ED Course below: ED Course: CBC negative for leukocytosis, normal hemoglobin, normal platelets.  Chest x-ray negative for focal consolidation, pneumothorax or pleural effusion.  Patient signed out to oncoming provider at 7 AM pending further evaluation.  Fortunately patient's nausea has resolved without requiring antiemetics.  Reassessed patient, she is feeling much improved since being in the ED. Reviewed results of labs, chest x-ray and EKG with her. Discussed using miralax for her constipation; prescription provided today. Patient will follow up with her primary care provider. Return precautions reviewed. Patient is agreeable with the plan.   The pt has no history of HTN.   She is on no chronic medications except for prn MIralax. She was discharged from ED on Miralax.   She has taken omeprazole in the past for GERD symptoms. She had an  echocardiogram in 04/2022 which revealed LVEF of 55-60%. ED labs revealed essentially normal CMP, normal CBC and TSH and HS troponin. Her Covid 19, RSV and flu testing was negative.  Pt was doing well and her home BP readings normalized after 3 days. Her BP today is 124/84. She has more recent symptoms sore throat, sinus congestion and dry cough and right sided chest pain. She did have 1 episode of green mucus.   These symptoms started 4 days ago.  Covid 19 vaccinated : yes but no recent booster Known recent Covid 19 exposure: no Recent home Covid 19 testing - no     ROS Constitutional:  Denies  unexpected weight loss or gain, or weakness  Eyes:  Denies visual changes Respiratory:  history as noted above Cardiovascular:  Denies chest pain, palpitations or lower extremity swelling  GI:  Denies abdominal pain, diarrhea Musculoskeletal:  Denies myalgias Skin:  Denies nonhealing lesions Neurologic:  Denies headache, focal weakness or numbness, tingling Endocrine:  Denies polyuria or polydypsia  Psychiatric:  Denies depression, anxiety   Outpatient Medications Prior to Visit  Medication Sig Dispense Refill  . CALCIUM ORAL Take by mouth.    SABRA ibuprofen (ADVIL,MOTRIN) 200 MG tablet Take 1 tablet (200 mg total) by mouth every six (6) hours as needed for pain.    . multivitamin (TAB-A-VITE/THERAGRAN) per tablet Take 1 tablet by mouth daily.    SABRA omeprazole (PRILOSEC OTC) 20 MG tablet Take 1 tablet (20 mg total) by mouth daily. (Patient taking differently: Take 1 tablet (20 mg total) by mouth daily as needed.) 30 tablet 3  . polyethylene glycol (MIRALAX) 17 gram packet Take 17 g by mouth daily. 30 packet 0   No facility-administered medications prior to visit.     Objective:    Vital Signs BP 128/84 (BP Site: L Arm, BP Position: Sitting)   Pulse 78   Ht 166.2 cm (5' 5.43)   Wt 71.5 kg (157 lb 9.6 oz)   SpO2 95%   BMI 25.88 kg/m    Exam General: normal appearance EYES: Anicteric  sclerae. ENT: Oropharynx moist. RESP: Relaxed respiratory effort. Clear to auscultation without wheezes or crackles.  CV: Regular rate and rhythm. Normal S1 and S2. No murmurs or gallops.  No lower extremity edema. Posterior tibial pulses are 2+ and symmetric. abd exam: non tender, no masses, no HSM  MSK: No focal muscle tenderness. SKIN: Appropriately warm and moist. NEURO: Stable gait and coordination.  Allergies:   Ciprofloxacin  Current Medications:   Current Outpatient Medications  Medication Sig Dispense Refill  . CALCIUM ORAL Take by mouth.    SABRA ibuprofen (ADVIL,MOTRIN)  200 MG tablet Take 1 tablet (200 mg total) by mouth every six (6) hours as needed for pain.    . multivitamin (TAB-A-VITE/THERAGRAN) per tablet Take 1 tablet by mouth daily.    SABRA omeprazole (PRILOSEC OTC) 20 MG tablet Take 1 tablet (20 mg total) by mouth daily. (Patient taking differently: Take 1 tablet (20 mg total) by mouth daily as needed.) 30 tablet 3  . polyethylene glycol (MIRALAX) 17 gram packet Take 17 g by mouth daily. 30 packet 0  . benzonatate (TESSALON) 200 MG capsule Take 1 capsule (200 mg total) by mouth Three (3) times a day as needed for up to 7 days. 21 capsule 0  . doxycycline (VIBRAMYCIN) 100 MG capsule 1 bid for 10 days 20 capsule 0   No current facility-administered medications for this visit.      Note - This record has been created using [employer]. Chart creation errors have been sought, but may not always have been located. Such creation errors do not reflect on the standard of medical care.  Yvonne Luyando, MD

## 2023-10-25 NOTE — Progress Notes (Signed)
Negative rapid strep test

## 2023-10-26 NOTE — Progress Notes (Signed)
 Negative rapid respiratory panel result.

## 2024-06-25 ENCOUNTER — Emergency Department
Admission: EM | Admit: 2024-06-25 | Discharge: 2024-06-25 | Disposition: A | Discharge status: Home / Self Care | Attending: Emergency Medicine | Admitting: Emergency Medicine

## 2024-06-25 ENCOUNTER — Other Ambulatory Visit: Payer: Self-pay

## 2024-06-25 DIAGNOSIS — I10 Essential (primary) hypertension: Secondary | ICD-10-CM | POA: Diagnosis not present

## 2024-06-25 DIAGNOSIS — R531 Weakness: Secondary | ICD-10-CM | POA: Insufficient documentation

## 2024-06-25 DIAGNOSIS — R519 Headache, unspecified: Secondary | ICD-10-CM | POA: Diagnosis present

## 2024-06-25 DIAGNOSIS — R110 Nausea: Secondary | ICD-10-CM | POA: Diagnosis not present

## 2024-06-25 LAB — CBC WITH DIFFERENTIAL/PLATELET
Abs Immature Granulocytes: 0.02 K/uL (ref 0.00–0.07)
Basophils Absolute: 0 K/uL (ref 0.0–0.1)
Basophils Relative: 1 %
Eosinophils Absolute: 0.1 K/uL (ref 0.0–0.5)
Eosinophils Relative: 2 %
HCT: 39.6 % (ref 36.0–46.0)
Hemoglobin: 13.1 g/dL (ref 12.0–15.0)
Immature Granulocytes: 0 %
Lymphocytes Relative: 49 %
Lymphs Abs: 3.2 K/uL (ref 0.7–4.0)
MCH: 30.8 pg (ref 26.0–34.0)
MCHC: 33.1 g/dL (ref 30.0–36.0)
MCV: 93 fL (ref 80.0–100.0)
Monocytes Absolute: 0.5 K/uL (ref 0.1–1.0)
Monocytes Relative: 8 %
Neutro Abs: 2.6 K/uL (ref 1.7–7.7)
Neutrophils Relative %: 40 %
Platelets: 240 K/uL (ref 150–400)
RBC: 4.26 MIL/uL (ref 3.87–5.11)
RDW: 13 % (ref 11.5–15.5)
WBC: 6.6 K/uL (ref 4.0–10.5)
nRBC: 0 % (ref 0.0–0.2)

## 2024-06-25 LAB — BASIC METABOLIC PANEL WITH GFR
Anion gap: 9 (ref 5–15)
BUN: 14 mg/dL (ref 8–23)
CO2: 27 mmol/L (ref 22–32)
Calcium: 9.7 mg/dL (ref 8.9–10.3)
Chloride: 105 mmol/L (ref 98–111)
Creatinine, Ser: 0.71 mg/dL (ref 0.44–1.00)
GFR, Estimated: >60 mL/min (ref >60)
Glucose, Bld: 101 mg/dL — ABNORMAL HIGH (ref 70–99)
Potassium: 3.4 mmol/L — ABNORMAL LOW (ref 3.5–5.1)
Sodium: 141 mmol/L (ref 135–145)

## 2024-06-25 LAB — URINALYSIS, ROUTINE W REFLEX MICROSCOPIC
Bilirubin Urine: NEGATIVE
Glucose, UA: NEGATIVE mg/dL
Hgb urine dipstick: NEGATIVE
Ketones, ur: NEGATIVE mg/dL
Leukocytes,Ua: NEGATIVE
Nitrite: NEGATIVE
Protein, ur: NEGATIVE mg/dL
Specific Gravity, Urine: 1.008 (ref 1.005–1.030)
pH: 8 (ref 5.0–8.0)

## 2024-06-25 LAB — TROPONIN I (HIGH SENSITIVITY)
Troponin I (High Sensitivity): 11 ng/L (ref <18)
Troponin I (High Sensitivity): 10 ng/L (ref <18)

## 2024-06-25 NOTE — ED Provider Notes (Signed)
 [facility] Provider Note    Event Date/Time   First MD Initiated Contact with Patient 06/25/24 [phone]     (approximate)   History   Hypertension and Weakness   HPI  Angelica Mendoza is a 73 y.o. female with a history of elevated blood pressure who presents with generalized weakness since early this morning.  The patient states that she woke up during the night, around 3 AM, with a sensation of nausea and feeling generally unwell.  She felt she needed to go to the hospital.  She then started to feel increasingly weak as the morning progressed.  She also reports some bodyaches and joint pains as well as a headache.  Over the last few hours while waiting in the waiting room, she has started to feel a lot better and states that she is not having any acute symptoms currently.  She denies feeling short of breath or having any chest pain.  She had no vomiting or diarrhea.  She has had no weakness or numbness in her arms or legs or any vision changes or speech difficulty.  Her blood pressure was noted to be significantly elevated with EMS although it is improved now.  She does endorse a lot of stress recently.  I reviewed the past medical records for the patient's most recent outpatient encounter in our system was on 12/12 the last year for follow-up after an ED visit.   Physical Exam   Triage Vital Signs: ED Triage Vitals  Encounter Vitals Group     BP 06/25/24 0446 (!) 167/83     Girls Systolic BP Percentile --      Girls Diastolic BP Percentile --      Boys Systolic BP Percentile --      Boys Diastolic BP Percentile --      Pulse Rate 06/25/24 0446 69     Resp 06/25/24 0446 17     Temp 06/25/24 0446 98.7 F (37.1 C)     Temp Source 06/25/24 0446 Oral     SpO2 06/25/24 0446 100 %     Weight 06/25/24 0445 164 lb (74.4 kg)     Height 06/25/24 0445 5' 5 (1.651 m)     Head Circumference --      Peak Flow --      Pain Score 06/25/24 0445 0     Pain Loc --       Pain Education --      Exclude from Growth Chart --     Most recent vital signs: Vitals:   06/25/24 0956 06/25/24 1000  BP: (!) 160/67 (!) 154/75  Pulse: (!) 59 74  Resp:    Temp:    SpO2: 97% 99%     General: Alert, well-appearing, no distress.  CV:  Good peripheral perfusion.  Normal heart sounds. Resp:  Normal effort.  Lungs CTAB. Abd:  No distention.  Other:  No peripheral edema.  Slightly dry mucous membranes.  Intact in all extremities.  Neck supple, full ROM.   ED Results / Procedures / Treatments   Labs (all labs ordered are listed, but only abnormal results are displayed) Labs Reviewed  BASIC METABOLIC PANEL WITH GFR - Abnormal; Notable for the following components:      Result Value   Potassium 3.4 (*)    Glucose, Bld 101 (*)    All other components within normal limits  URINALYSIS, ROUTINE W REFLEX MICROSCOPIC - Abnormal; Notable for the following components:  Color, Urine STRAW (*)    APPearance CLEAR (*)    All other components within normal limits  CBC WITH DIFFERENTIAL/PLATELET  TROPONIN I (HIGH SENSITIVITY)  TROPONIN I (HIGH SENSITIVITY)     EKG  ED ECG REPORT I, Waylon Cassis, the attending physician, personally viewed and interpreted this ECG.  Date: 06/25/2024 EKG Time: 0449 Rate: 68 Rhythm: normal sinus rhythm QRS Axis: normal Intervals: normal ST/T Wave abnormalities: Nonspecific T wave abnormalities Narrative Interpretation: no evidence of acute ischemia    RADIOLOGY    PROCEDURES:  Critical Care performed: No  Procedures   MEDICATIONS ORDERED IN ED: Medications - No data to display   IMPRESSION / MDM / ASSESSMENT AND PLAN / ED COURSE  I reviewed the triage vital signs and the nursing notes.  73 year old female with PMH as noted above presents with an episode of generalized weakness associated with nausea and some bodyaches as well as a headache this morning.  She was hypertensive with EMS but this has resolved.   Physical exam is unremarkable and the patient states that her symptoms have almost completely resolved.  Differential diagnosis includes, but is not limited to, dehydration, lecture light abnormality, viral syndrome, acute stress/anxiety.  I have a low suspicion for cardiac etiology.  The patient's workup is reassuring.  BMP and CBC show no acute findings.  Troponins are negative x 2.  Urinalysis is negative.  Given the negative workup and resolved symptoms, there is no indication for further ED management.  The patient is stable for discharge home.  She feels comfortable and would like to go home.  I counseled her on the results of the workup.  I gave strict return precautions, and she expresses understanding.  Patient's presentation is most consistent with acute complicated illness / injury requiring diagnostic workup.     FINAL CLINICAL IMPRESSION(S) / ED DIAGNOSES   Final diagnoses:  Generalized weakness     Rx / DC Orders   ED Discharge Orders     None        Note:  This document was prepared using Dragon voice recognition software and may include unintentional dictation errors.    Cassis Waylon, MD 06/25/24 1227

## 2024-06-25 NOTE — ED Triage Notes (Signed)
 Pt to ED via EMS from home, pt woke up this morning with generalized weakness, per ems BP of 206/103 no hx HTN. Pt had cataracts surgery on 8/1. Pt denies chest pain, numbness weakness or dizziness.

## 2024-06-25 NOTE — ED Notes (Signed)
 Pt ambulatory to bathroom in room with no assistance.

## 2024-06-25 NOTE — Discharge Instructions (Signed)
 Monitor your symptoms over the next few days.  Return to the ER immediately for new, worsening, or persistent severe weakness, nausea, dizziness, any chest pain, severe headache, vomiting, or any other new or worsening symptoms that concern you.  It is your decision whether to proceed with your cataract surgery on Friday.  If you are feeling back to your usual self by tomorrow morning there is no specific reason why you cannot have the surgery.
